# Patient Record
Sex: Male | Born: 1960
Health system: Southern US, Community
[De-identification: ages and names within clinical notes are randomized; demographics above are authoritative.]

## PROBLEM LIST (undated history)

## (undated) DIAGNOSIS — M1990 Unspecified osteoarthritis, unspecified site: Secondary | ICD-10-CM

## (undated) DIAGNOSIS — D649 Anemia, unspecified: Secondary | ICD-10-CM

## (undated) DIAGNOSIS — E119 Type 2 diabetes mellitus without complications: Secondary | ICD-10-CM

## (undated) DIAGNOSIS — G4730 Sleep apnea, unspecified: Secondary | ICD-10-CM

## (undated) DIAGNOSIS — E785 Hyperlipidemia, unspecified: Secondary | ICD-10-CM

## (undated) DIAGNOSIS — E669 Obesity, unspecified: Secondary | ICD-10-CM

## (undated) DIAGNOSIS — I10 Essential (primary) hypertension: Secondary | ICD-10-CM

## (undated) HISTORY — DX: Type 2 diabetes mellitus without complications: E11.9

## (undated) HISTORY — DX: Sleep apnea, unspecified: G47.30

## (undated) HISTORY — PX: FOOT SURGERY: SHX648

## (undated) HISTORY — DX: Obesity, unspecified: E66.9

## (undated) HISTORY — DX: Anemia, unspecified: D64.9

## (undated) HISTORY — DX: Unspecified osteoarthritis, unspecified site: M19.90

## (undated) HISTORY — DX: Hyperlipidemia, unspecified: E78.5

## (undated) HISTORY — DX: Essential (primary) hypertension: I10

---

## 1998-08-10 ENCOUNTER — Inpatient Hospital Stay (HOSPITAL_COMMUNITY): Admission: RE | Admit: 1998-08-10 | Discharge: 1998-08-11 | Payer: Self-pay | Admitting: Orthopedic Surgery

## 1998-08-10 ENCOUNTER — Encounter: Payer: Self-pay | Admitting: Orthopedic Surgery

## 2007-05-13 HISTORY — PX: COLONOSCOPY: SHX174

## 2008-02-17 ENCOUNTER — Ambulatory Visit: Payer: Self-pay | Admitting: Gastroenterology

## 2008-03-02 ENCOUNTER — Ambulatory Visit: Payer: Self-pay | Admitting: Gastroenterology

## 2008-03-08 ENCOUNTER — Telehealth: Payer: Self-pay | Admitting: Gastroenterology

## 2010-10-08 ENCOUNTER — Encounter: Payer: Self-pay | Admitting: Gastroenterology

## 2010-11-21 ENCOUNTER — Ambulatory Visit (INDEPENDENT_AMBULATORY_CARE_PROVIDER_SITE_OTHER): Payer: Managed Care, Other (non HMO) | Admitting: Gastroenterology

## 2010-11-21 ENCOUNTER — Encounter: Payer: Self-pay | Admitting: Gastroenterology

## 2010-11-21 VITALS — BP 106/74 | HR 80 | Ht 68.0 in | Wt 259.0 lb

## 2010-11-21 DIAGNOSIS — D509 Iron deficiency anemia, unspecified: Secondary | ICD-10-CM | POA: Insufficient documentation

## 2010-11-21 DIAGNOSIS — E119 Type 2 diabetes mellitus without complications: Secondary | ICD-10-CM

## 2010-11-21 NOTE — Patient Instructions (Signed)
Upper GI Endoscopy Upper GI endoscopy means using a flexible scope to look at the esophagus, stomach and upper small bowel. This is done to make a diagnosis in people with heartburn, abdominal pain, or abnormal bleeding. Sometimes an endoscope is needed to remove foreign bodies or food that become stuck in the esophagus; it can also be used to take biopsy samples. For the best results, do not eat or drink for 8 hours before having your upper endoscopy.  To perform the endoscopy, you will probably be sedated and your throat will be numbed with a special spray. The endoscope is then slowly passed down your throat (this will not interfere with your breathing). An endoscopy exam takes 15-30 minutes to complete and there is no real pain. Patients rarely remember much about the procedure. The results of the test may take several days if a biopsy or other test is taken.  You may have a sore throat after an endoscopy exam. Serious complications are very rare. Stick to liquids and soft foods until your pain is better. You should not drive a car or operate any dangerous equipment for at least 24 hours after being sedated. SEEK IMMEDIATE MEDICAL CARE IF:  You have severe throat pain.   You have shortness of breath.   You have bleeding problems.   You have a fever.   You have difficulty recovering from your sedation.  Document Released: 06/05/2004 Document Re-Released: 07/23/2009 [facility] Patient Information 2011 [place], [place]. Your EGD is scheduled on 11/25/2010 at 3pm You will go to the basement today to get your lab hemoccult kit

## 2010-11-21 NOTE — Assessment & Plan Note (Signed)
Anemia is probably related to chronic GI blood loss. Active peptic ulcer disease, AVMs or neoplasm are considerations.  Recommendations #1 upper endoscopy #2 serial Hemoccults; if positive and his endoscopy is nondiagnostic I will repeat his colonoscopy

## 2010-11-21 NOTE — Progress Notes (Signed)
History of Present Illness: Donald [name] is a pleasant 50 year old Afro-American male with non-insulin-dependent diabetes mellitus referred at the request of Dr. Perrin Maltese for evaluation of anemia. He was seen at Urgent Care for a brief diarrheal illness. Lab work was pertinent for hemoglobin of 12.7 and MCV is 79.7. Serum iron was 22 and percent saturation 9. He has no GI complaints at this time including change of bowel habits, abdominal pain, melena or hematochezia. He rarely takes an NSAID. There is no history of ulcer disease.  Colonoscopy 2009 was normal.    Review of Systems: Pertinent positive and negative review of systems were noted in the above HPI section. All other review of systems were otherwise negative.    Current Medications, Allergies, Past Medical History, Past Surgical History, Family History and Social History were reviewed in [employer] record . Vital signs were reviewed in today's medical record Physical Exam: General: Well developed , well nourished, no acute distress Head: Normocephalic and atraumatic Eyes:  sclerae anicteric, EOMI Ears: Normal auditory acuity Mouth: No deformity or lesions Neck: Supple, no masses or thyromegaly Lungs: Clear throughout to auscultation Heart: Regular rate and rhythm; no murmurs, rubs or bruits Abdomen: Soft, non tender and non distended. No masses, hepatosplenomegaly or hernias noted. Normal Bowel sounds Rectal:no rectal masses; stool heme negative Musculoskeletal: Symmetrical with no gross deformities  Skin: No lesions on visible extremities Pulses:  Normal pulses noted Extremities: No clubbing, cyanosis, edema or deformities noted Neurological: Alert oriented x 4, grossly nonfocal Cervical Nodes:  No significant cervical adenopathy Inguinal Nodes: No significant inguinal adenopathy Psychological:  Alert and cooperative. Normal mood and affect

## 2010-11-22 ENCOUNTER — Telehealth: Payer: Self-pay | Admitting: Gastroenterology

## 2010-11-25 ENCOUNTER — Other Ambulatory Visit: Payer: Managed Care, Other (non HMO) | Admitting: Gastroenterology

## 2011-02-11 LAB — GLUCOSE, CAPILLARY
Glucose-Capillary: 105 — ABNORMAL HIGH
Glucose-Capillary: 97

## 2011-02-27 NOTE — Telephone Encounter (Signed)
See other phone message  

## 2011-06-18 ENCOUNTER — Other Ambulatory Visit: Payer: Self-pay | Admitting: Physician Assistant

## 2011-07-07 ENCOUNTER — Ambulatory Visit (INDEPENDENT_AMBULATORY_CARE_PROVIDER_SITE_OTHER): Payer: Managed Care, Other (non HMO) | Admitting: Family Medicine

## 2011-07-07 ENCOUNTER — Ambulatory Visit: Payer: Managed Care, Other (non HMO)

## 2011-07-07 VITALS — BP 117/71 | HR 77 | Temp 98.0°F | Resp 20 | Ht 69.0 in | Wt 259.0 lb

## 2011-07-07 DIAGNOSIS — I10 Essential (primary) hypertension: Secondary | ICD-10-CM

## 2011-07-07 DIAGNOSIS — Z0000 Encounter for general adult medical examination without abnormal findings: Secondary | ICD-10-CM

## 2011-07-07 DIAGNOSIS — E119 Type 2 diabetes mellitus without complications: Secondary | ICD-10-CM

## 2011-07-07 LAB — POCT CBC
Granulocyte percent: 46.1 %G (ref 37–80)
HCT, POC: 41.9 % — AB (ref 43.5–53.7)
Hemoglobin: 13.1 g/dL — AB (ref 14.1–18.1)
Lymph, poc: 2 (ref 0.6–3.4)
MCH, POC: 24.9 pg — AB (ref 27–31.2)
MCHC: 31.3 g/dL — AB (ref 31.8–35.4)
MCV: 79.6 fL — AB (ref 80–97)
MID (cbc): 0.4 (ref 0–0.9)
MPV: 7.8 fL (ref 0–99.8)
POC Granulocyte: 2 (ref 2–6.9)
POC LYMPH PERCENT: 45.6 %L (ref 10–50)
POC MID %: 8.3 %M (ref 0–12)
Platelet Count, POC: 227 10*3/uL (ref 142–424)
RBC: 5.27 M/uL (ref 4.69–6.13)
RDW, POC: 14.2 %
WBC: 4.3 10*3/uL — AB (ref 4.6–10.2)

## 2011-07-07 LAB — POCT URINALYSIS DIPSTICK
Bilirubin, UA: NEGATIVE
Glucose, UA: NEGATIVE
Ketones, UA: NEGATIVE
Leukocytes, UA: NEGATIVE
Nitrite, UA: NEGATIVE
Protein, UA: NEGATIVE
Spec Grav, UA: 1.02
Urobilinogen, UA: 0.2
pH, UA: 5.5

## 2011-07-07 LAB — POCT GLYCOSYLATED HEMOGLOBIN (HGB A1C): Hemoglobin A1C: 5.7

## 2011-07-07 LAB — IFOBT (OCCULT BLOOD): IFOBT: NEGATIVE

## 2011-07-07 NOTE — Progress Notes (Signed)
[facility]:  Office Visit  Chief Complaint:  Chief Complaint  Patient presents with  . Annual Exam    HPI: Donald [name] is a 51 y.o. male who complains of annual exam. No new problems. He still works as a Investment banker, operational but is starting to change his diet and working out 3 times per week. Cutting out fast food.   Past Medical History  Diagnosis Date  . Anemia   . DM (diabetes mellitus)   . Sleep apnea     on CPAP  . Obesity   . Hypertension    Past Surgical History  Procedure Date  . Foot surgery   . Colonoscopy 2009   History   Social History  . Marital Status: Married    Spouse Name: N/A    Number of Children: N/A  . Years of Education: N/A   Occupational History  . chef    Social History Main Topics  . Smoking status: Never Smoker   . Smokeless tobacco: None  . Alcohol Use: No  . Drug Use: No  . Sexually Active: None   Other Topics Concern  . None   Social History Narrative  . None   Family History  Problem Relation Age of Onset  . Diabetes Sister   . Colon cancer Neg Hx    No Known Allergies Prior to Admission medications   Medication Sig Start Date End Date Taking? Authorizing Provider  aspirin 81 MG tablet Take 81 mg by mouth daily.     Yes Historical Provider, MD  lisinopril (PRINIVIL,ZESTRIL) 5 MG tablet TAKE 1 TABLET DAILY 06/18/11  Yes Ryan Dunn, PA-C  metFORMIN (GLUCOPHAGE) 500 MG tablet TAKE 1 TABLET TWICE DAILY 06/18/11  Yes Ryan Dunn, PA-C     ROS: The patient denies fevers, chills, night sweats, unintentional weight loss, chest pain, palpitations, wheezing, dyspnea on exertion, nausea, vomiting, abdominal pain, dysuria, hematuria, melena, numbness, weakness, or tingling.  All other systems have been reviewed and were otherwise negative with the exception of those mentioned in the HPI and as above.    PHYSICAL EXAM: Filed Vitals:   07/07/11 0858  BP: 117/71  Pulse: 77  Temp: 98 F (36.7 C)  Resp: 20   Filed Vitals:   07/07/11 0858  Height: 5\' 9"  (1.753 m)  Weight: 259 lb (117.482 kg)   Body mass index is 38.25 kg/(m^2).  General: Alert, no acute distress, obese HEENT:  Normocephalic, atraumatic, oropharynx patent. Cardiovascular:  Regular rate and rhythm, no rubs murmurs or gallops.  No Carotid bruits, radial pulse intact. No pedal edema.  Respiratory: Clear to auscultation bilaterally.  No wheezes, rales, or rhonchi.  No cyanosis, no use of accessory musculature GI: No organomegaly, abdomen is soft and non-tender, positive bowel sounds.  No masses. Skin: No rashes. Neurologic: Facial musculature symmetric. +Sensation intact, vibratory intact.  Psychiatric: Patient is appropriate throughout our interaction. Lymphatic: No cervical lymphadenopathy Musculoskeletal: Gait intact. Genitourinary: uncircumcised, no masses. No hernias, no lesions. Prostate normal.   LABS:    ASSESSMENT/PLAN: Encounter Diagnoses  Name Primary?  . Annual physical exam Yes  . Diabetes mellitus   . HTN (hypertension)    1.Anemia-stable with Hgb at 13.1, hemmosure negative, UTD on colonoscopy 2. HTN-stable, no SEs. 3. T2DM-stable, no neuropathy or hypoglycemia HbA1c at 5.7 4. Labs checked today include: CBC, HbA1c, Hemmosure, CMP, TSH, PSA, Lipids  Since patient is compliant with meds and doing so well with his BP and T2DM, he may return in  6 months for f/u. If at anytime his T2Dm is not well controlled then will bring him back q53months.   Joniya Boberg PHUONG, DO 07/08/2011 2:42 PM

## 2011-07-08 LAB — MICROALBUMIN / CREATININE URINE RATIO
Creatinine, Urine: 98.2 mg/dL
Microalb Creat Ratio: 5.1 mg/g (ref 0.0–30.0)
Microalb, Ur: 0.5 mg/dL (ref 0.00–1.89)

## 2011-07-08 LAB — LIPID PANEL
Cholesterol: 134 mg/dL (ref 0–200)
HDL: 39 mg/dL — ABNORMAL LOW (ref >39)
LDL Cholesterol: 85 mg/dL (ref 0–99)
Total CHOL/HDL Ratio: 3.4 ratio
Triglycerides: 49 mg/dL (ref <150)
VLDL: 10 mg/dL (ref 0–40)

## 2011-07-08 LAB — COMPREHENSIVE METABOLIC PANEL
ALT: 19 U/L (ref 0–53)
AST: 21 U/L (ref 0–37)
Albumin: 4 g/dL (ref 3.5–5.2)
Alkaline Phosphatase: 53 U/L (ref 39–117)
BUN: 14 mg/dL (ref 6–23)
Chloride: 105 mEq/L (ref 96–112)
Potassium: 4.8 mEq/L (ref 3.5–5.3)

## 2011-07-08 LAB — COMPREHENSIVE METABOLIC PANEL WITH GFR
CO2: 25 meq/L (ref 19–32)
Calcium: 9 mg/dL (ref 8.4–10.5)
Creat: 0.61 mg/dL (ref 0.50–1.35)
Glucose, Bld: 85 mg/dL (ref 70–99)
Sodium: 139 meq/L (ref 135–145)
Total Bilirubin: 0.6 mg/dL (ref 0.3–1.2)
Total Protein: 7.4 g/dL (ref 6.0–8.3)

## 2011-07-08 LAB — PSA: PSA: 0.84 ng/mL (ref <=4.00)

## 2011-07-08 LAB — TSH: TSH: 1.731 u[IU]/mL (ref 0.350–4.500)

## 2011-07-28 ENCOUNTER — Encounter: Payer: Self-pay | Admitting: Internal Medicine

## 2011-08-15 ENCOUNTER — Other Ambulatory Visit: Payer: Self-pay | Admitting: Physician Assistant

## 2011-09-01 ENCOUNTER — Other Ambulatory Visit: Payer: Self-pay | Admitting: Physician Assistant

## 2011-12-01 ENCOUNTER — Encounter: Payer: Self-pay | Admitting: Family Medicine

## 2012-01-15 ENCOUNTER — Other Ambulatory Visit: Payer: Self-pay | Admitting: Physician Assistant

## 2012-01-15 ENCOUNTER — Other Ambulatory Visit: Payer: Self-pay | Admitting: Internal Medicine

## 2012-03-15 ENCOUNTER — Encounter: Payer: Managed Care, Other (non HMO) | Admitting: Physician Assistant

## 2012-03-29 ENCOUNTER — Ambulatory Visit (INDEPENDENT_AMBULATORY_CARE_PROVIDER_SITE_OTHER): Payer: Managed Care, Other (non HMO) | Admitting: Physician Assistant

## 2012-03-29 ENCOUNTER — Encounter: Payer: Self-pay | Admitting: Physician Assistant

## 2012-03-29 VITALS — BP 120/74 | HR 80 | Temp 97.9°F | Resp 16 | Ht 68.0 in | Wt 256.6 lb

## 2012-03-29 DIAGNOSIS — Z23 Encounter for immunization: Secondary | ICD-10-CM

## 2012-03-29 DIAGNOSIS — Z0000 Encounter for general adult medical examination without abnormal findings: Secondary | ICD-10-CM

## 2012-03-29 DIAGNOSIS — D509 Iron deficiency anemia, unspecified: Secondary | ICD-10-CM

## 2012-03-29 DIAGNOSIS — E119 Type 2 diabetes mellitus without complications: Secondary | ICD-10-CM

## 2012-03-29 LAB — CBC
HCT: 38.3 % — ABNORMAL LOW (ref 39.0–52.0)
MCV: 76.3 fL — ABNORMAL LOW (ref 78.0–100.0)
RBC: 5.02 MIL/uL (ref 4.22–5.81)
RDW: 14 % (ref 11.5–15.5)
WBC: 5.1 10*3/uL (ref 4.0–10.5)

## 2012-03-29 MED ORDER — METFORMIN HCL 500 MG PO TABS: 500.0000 mg | ORAL_TABLET | Freq: Two times a day (BID) | ORAL | Status: DC

## 2012-03-29 MED ORDER — LISINOPRIL 5 MG PO TABS: 5.0000 mg | ORAL_TABLET | Freq: Every day | ORAL | Status: DC

## 2012-03-29 MED ORDER — GLUCOSE BLOOD VI STRP: ORAL_STRIP | Status: DC

## 2012-03-29 NOTE — Progress Notes (Signed)
Patient ID: Donald [name] MRN: [id number], DOB: 03/01/1961, 51 y.o. Date of Encounter: 03/29/2012, 3:39 PM  Primary Physician: Tally Due, MD  Chief Complaint: Diabetes follow up  HPI: 51 y.o. year old male with history below presents for follow up of diabetes mellitus. Doing well. No issues or complaints. Taking medications daily without adverse effects. No polydipsia, polyphagia, polyuria, or nocturia.  Blood sugars at home: in the low 100's Diet consists of: mostly vegetables. Eats some chicken, [place], and fish. Exercising regularly. Goes to the gym at least 3 days per week and on the days that he does not work at his part time job.  Last A1C: 5.7 on 07/07/11  Eye MD: 05/2011 DDS: 06/2011 Influenza vaccine: requests today Pneumococcal vaccine: 2010 Last CPE: 07/07/2011  Past Medical History  Diagnosis Date  . Anemia   . DM (diabetes mellitus)   . Sleep apnea     on CPAP  . Obesity   . Hypertension      Home Meds: Prior to Admission medications   Medication Sig Start Date End Date Taking? Authorizing Provider  aspirin 81 MG tablet Take 81 mg by mouth daily.     Yes Historical Provider, MD  glucose blood (ONE TOUCH ULTRA TEST) test strip Use as directed 08/15/11  Yes Morrell Riddle, PA-C  lisinopril (PRINIVIL,ZESTRIL) 5 MG tablet Take 1 tablet (5 mg total) by mouth daily. Needs office visit 01/15/12  Yes Nelva Nay, PA-C  metFORMIN (GLUCOPHAGE) 500 MG tablet Take 1 tablet (500 mg total) by mouth 2 (two) times daily. Needs office visit 01/15/12  Yes Nelva Nay, PA-C  lisinopril (PRINIVIL,ZESTRIL) 5 MG tablet TAKE 1 TABLET DAILY 09/01/11   Rickard Patience, PA-C    Allergies: No Known Allergies  History   Social History  . Marital Status: Married    Spouse Name: N/A    Number of Children: N/A  . Years of Education: N/A   Occupational History  . chef    Social History Main Topics  . Smoking status: Never Smoker   . Smokeless tobacco: Not on file  .  Alcohol Use: No  . Drug Use: No  . Sexually Active: Not on file   Other Topics Concern  . Not on file   Social History Narrative  . No narrative on file     Review of Systems: Constitutional: negative for chills, fever, night sweats, weight changes, or fatigue  HEENT: negative for vision changes, hearing loss, congestion, rhinorrhea, or epistaxis Cardiovascular: negative for chest pain, palpitations, diaphoresis, DOE, orthopnea, or edema Respiratory: negative for hemoptysis, wheezing, shortness of breath, dyspnea, or cough Abdominal: negative for abdominal pain, nausea, vomiting, diarrhea, or constipation Dermatological: negative for rash, erythema, or wounds Neurologic: negative for headache, dizziness, or syncope All other systems reviewed and are otherwise negative with the exception to those above and in the HPI.   Physical Exam: Blood pressure 120/74, pulse 80, temperature 97.9 F (36.6 C), temperature source Oral, resp. rate 16, height 5\' 8"  (1.727 m), weight 256 lb 9.6 oz (116.393 kg), SpO2 98.00%., Body mass index is 39.02 kg/(m^2). General: Well developed, well nourished, in no acute distress. Head: Normocephalic, atraumatic, eyes without discharge, sclera non-icteric, nares are without discharge. Bilateral auditory canals clear, TM's are without perforation, pearly grey and translucent with reflective cone of light bilaterally. Oral cavity moist, posterior pharynx without exudate, erythema, peritonsillar abscess, or post nasal drip.  Neck: Supple. No thyromegaly. Full ROM. No lymphadenopathy. No bruits.  Lungs: Clear bilaterally to auscultation without wheezes, rales, or rhonchi. Breathing is unlabored. Heart: RRR with S1 S2. No murmurs, rubs, or gallops appreciated. Msk:  Strength and tone normal for age. Extremities/Skin: Warm and dry. No clubbing or cyanosis. No edema. No rashes, wounds, or suspicious lesions. Monofilament exam unremarkable bilaterally.  Neuro: Alert and  oriented X 3. Moves all extremities spontaneously. Gait is normal. CNII-XII grossly in tact. Psych:  Responds to questions appropriately with a normal affect.   Labs: Results for orders placed in visit on 03/29/12  GLUCOSE, POCT (MANUAL RESULT ENTRY)      Component Value Range   POC Glucose 101 (*) 70 - 99 mg/dl  POCT GLYCOSYLATED HEMOGLOBIN (HGB A1C)      Component Value Range   Hemoglobin A1C 5.9     CMP, lipid, and CBC all pending. Patient is not fasting.   ASSESSMENT AND PLAN:  51 y.o.  year old male with well controlled diabetes mellitus and iron deficiency anemia 1. Diabetes mellitus -Well controlled -Continue current treatment -Metformin 500 mg 1 po bid #60 RF 3 -Glucose test strips #100 as directed RF 3 -Healthy diet, exercise, weight loss -Await labs -Influenza vaccine given today  2. Iron deficiency anemia -Known -Check CBC to verify stability   3. Recheck 3 months   Signed, Eula Listen, PA-C 03/29/2012 3:39 PM

## 2012-03-30 LAB — LIPID PANEL
Cholesterol: 121 mg/dL (ref 0–200)
HDL: 38 mg/dL — ABNORMAL LOW (ref >39)
Total CHOL/HDL Ratio: 3.2 Ratio
Triglycerides: 52 mg/dL (ref <150)

## 2012-03-30 LAB — COMPREHENSIVE METABOLIC PANEL
BUN: 9 mg/dL (ref 6–23)
CO2: 26 mEq/L (ref 19–32)
Calcium: 8.9 mg/dL (ref 8.4–10.5)
Chloride: 103 mEq/L (ref 96–112)
Creat: 0.57 mg/dL (ref 0.50–1.35)
Glucose, Bld: 87 mg/dL (ref 70–99)

## 2012-04-01 ENCOUNTER — Telehealth: Payer: Self-pay

## 2012-04-01 NOTE — Telephone Encounter (Signed)
Pt is calling the lab back from two days ago  Best number [phone]

## 2012-04-02 ENCOUNTER — Telehealth: Payer: Self-pay

## 2012-04-02 NOTE — Telephone Encounter (Signed)
Erroneous encounter

## 2012-04-02 NOTE — Telephone Encounter (Signed)
See labs 

## 2012-08-21 ENCOUNTER — Other Ambulatory Visit: Payer: Self-pay | Admitting: Physician Assistant

## 2012-09-04 ENCOUNTER — Ambulatory Visit (INDEPENDENT_AMBULATORY_CARE_PROVIDER_SITE_OTHER): Payer: BC Managed Care – PPO | Admitting: Family Medicine

## 2012-09-04 VITALS — BP 124/76 | HR 86 | Temp 98.0°F | Resp 18 | Ht 69.0 in | Wt 264.0 lb

## 2012-09-04 DIAGNOSIS — B351 Tinea unguium: Secondary | ICD-10-CM

## 2012-09-04 DIAGNOSIS — D509 Iron deficiency anemia, unspecified: Secondary | ICD-10-CM

## 2012-09-04 DIAGNOSIS — IMO0002 Reserved for concepts with insufficient information to code with codable children: Secondary | ICD-10-CM

## 2012-09-04 DIAGNOSIS — S76311A Strain of muscle, fascia and tendon of the posterior muscle group at thigh level, right thigh, initial encounter: Secondary | ICD-10-CM

## 2012-09-04 DIAGNOSIS — B353 Tinea pedis: Secondary | ICD-10-CM

## 2012-09-04 DIAGNOSIS — L84 Corns and callosities: Secondary | ICD-10-CM

## 2012-09-04 DIAGNOSIS — E119 Type 2 diabetes mellitus without complications: Secondary | ICD-10-CM

## 2012-09-04 LAB — COMPREHENSIVE METABOLIC PANEL
ALT: 21 U/L (ref 0–53)
AST: 21 U/L (ref 0–37)
Albumin: 4 g/dL (ref 3.5–5.2)
Alkaline Phosphatase: 51 U/L (ref 39–117)
Glucose, Bld: 110 mg/dL — ABNORMAL HIGH (ref 70–99)
Potassium: 4.4 mEq/L (ref 3.5–5.3)
Sodium: 137 mEq/L (ref 135–145)
Total Protein: 7.7 g/dL (ref 6.0–8.3)

## 2012-09-04 LAB — POCT CBC
Granulocyte percent: 56.3 %G (ref 37–80)
HCT, POC: 39.4 % — AB (ref 43.5–53.7)
POC Granulocyte: 2.5 (ref 2–6.9)
POC LYMPH PERCENT: 34.2 %L (ref 10–50)
RDW, POC: 13.5 %

## 2012-09-04 LAB — LIPID PANEL
LDL Cholesterol: 92 mg/dL (ref 0–99)
Triglycerides: 37 mg/dL (ref <150)

## 2012-09-04 LAB — GLUCOSE, POCT (MANUAL RESULT ENTRY): POC Glucose: 112 mg/dl — AB (ref 70–99)

## 2012-09-04 LAB — FERRITIN: Ferritin: 225 ng/mL (ref 22–322)

## 2012-09-04 MED ORDER — TRAMADOL HCL 50 MG PO TABS: 50.0000 mg | ORAL_TABLET | Freq: Three times a day (TID) | ORAL | Status: DC | PRN

## 2012-09-04 MED ORDER — NYSTATIN 100000 UNIT/GM EX POWD: Freq: Four times a day (QID) | CUTANEOUS | Status: DC

## 2012-09-04 MED ORDER — TERBINAFINE HCL 250 MG PO TABS: 250.0000 mg | ORAL_TABLET | Freq: Every day | ORAL | Status: DC

## 2012-09-04 NOTE — Patient Instructions (Addendum)
Ice your hamstring/area of pain for 15 minutes at least 3 times a day.  Start aleve twice a day for the pain and then you can use the tramadol for when the pain gets bad at work.  If you are still having pain in another 2 wks, we will want to rfer you to physical therapy.  After you are beginning to feel better, start the stretching exercises.  Hamstring Strain with Rehab The hamstring muscle and tendons are vulnerable to muscle or tendon tear (strain). Hamstring tears cause pain and inflammation in the backside of the thigh, where the hamstring muscles are located. The hamstring is comprised of three muscles that are responsible for straightening the hip, bending the knee, and stabilizing the knee. These muscles are important for walking, running, and jumping. Hamstring strain is the most common injury of the thigh. Hamstring strains are classified as grade 1 or 2 strains. Grade 1 strains cause pain, but the tendon is not lengthened. Grade 2 strains include a lengthened ligament due to the ligament being stretched or partially ruptured. With grade 2 strains there is still function, although the function may be decreased.  SYMPTOMS   Pain, tenderness, swelling, warmth, or redness over the hamstring muscles, at the back of the thigh.  Pain that gets worse during and after intense activity.  A "pop" heard in the area, at the time of injury.  Muscle spasm in the hamstring muscles.  Pain or weakness with running, jumping, or bending the knee against resistance.  Crackling sound (crepitation) when the tendon is moved or touched.  Bruising (contusion) in the thigh within 48 hours of injury.  Loss of fullness of the muscle, or area of muscle bulging in the case of a complete rupture. CAUSES  A muscle strain occurs when a force is placed on the muscle or tendon that is greater than it can withstand. Common causes of injury include:  Strain from overuse or sudden increase in the frequency, duration,  or intensity of activity.  Single violent blow or force to the back of the knee or the hamstring area of the thigh. RISK INCREASES WITH:  Sports that require quick starts (sprinting, racquetball, tennis).  Sports that require jumping (basketball, volleyball).  Kicking sports and water skiing.  Contact sports (soccer, football).  Poor strength and flexibility.  Failure to warm up properly before activity.  Previous thigh, knee, or pelvis injury.  Poor exercise technique.  Poor posture. PREVENTION  Maintain physical fitness:  Strength, flexibility, and endurance.  Cardiovascular fitness.  Learn and use proper exercise technique and posture.  Wear proper fitted and padded protective equipment. PROGNOSIS  If treated properly, hamstring strains are usually curable in 2 to 6 weeks. RELATED COMPLICATIONS   Longer healing time, if not properly treated or if not given adequate time to heal.  Chronically inflamed tendon, causing persistent pain with activity that may progress to constant pain.  Recurring symptoms, if activity is resumed too soon.  Vulnerable to repeated injury (in up to 25% of cases). TREATMENT  Treatment first involves the use of ice and medication to help reduce pain and inflammation. It is also important to complete strengthening and stretching exercises, as well as modifying any activities that aggravate the symptoms. These exercises may be completed at home or with a therapist. Your caregiver may recommend the use of crutches to help reduce pain and discomfort, especially is the strain is severe enough to cause limping. If the tendon has pulled away from the bone,  then surgery is necessary to reattach it. MEDICATION   If pain medicine is needed, nonsteroidal anti-inflammatory medicines (aspirin and ibuprofen), or other minor pain relievers (acetaminophen), are often advised.  Do not take pain medicine for 7 days before surgery.  Prescription pain  relievers may be given if your caregiver thinks they are needed. Use only as directed and only as much as you need.  Corticosteroid injections may be recommended. However, these injections should only be used for serious cases, as they can only be given a certain number of times.  Ointments applied to the skin may be beneficial. HEAT AND COLD  Cold treatment (icing) relieves pain and reduces inflammation. Cold treatment should be applied for 10 to 15 minutes every 2 to 3 hours, and immediately after activity that aggravates your symptoms. Use ice packs or an ice massage.  Heat treatment may be used before performing the stretching and strengthening activities prescribed by your caregiver, physical therapist, or athletic trainer. Use a heat pack or a warm water soak. SEEK MEDICAL CARE IF:   Symptoms get worse or do not improve in 2 weeks, despite treatment.  New, unexplained symptoms develop. (Drugs used in treatment may produce side effects.) EXERCISES RANGE OF MOTION (ROM) AND STRETCHING EXERCISES - Hamstring Strain These exercises may help you when beginning to rehabilitate your injury. Your symptoms may go away with or without further involvement from your physician, physical therapist or athletic trainer. While completing these exercises, remember:   Restoring tissue flexibility helps normal motion to return to the joints. This allows healthier, less painful movement and activity.  An effective stretch should be held for at least 30 seconds.  A stretch should never be painful. You should only feel a gentle lengthening or release in the stretched tissue. STRETCH - Hamstrings, Standing  Stand or sit, and extend your right / left leg, placing your foot on a chair or foot stool.  Keep a slight arch in your low back and your hips straight forward.  Lead with your chest, and lean forward at the waist until you feel a gentle stretch in the back of your right / left knee or thigh. (When  done correctly, this exercise requires leaning only a small distance.)  Hold this position for __________ seconds. Repeat __________ times. Complete this stretch __________ times per day. STRETCH  Hamstrings, Supine   Lie on your back. Loop a belt or towel over the ball of your right / left foot.  Straighten your right / left knee and slowly pull on the belt to raise your leg. Do not allow the right / left knee to bend. Keep your opposite leg flat on the floor.  Raise the leg until you feel a gentle stretch behind your right / left knee or thigh. Hold this position for __________ seconds. Repeat __________ times. Complete this stretch __________ times per day.  STRETCH - Hamstrings, Doorway  Lie on your back with your right / left leg extended and resting on the wall, and the opposite leg flat on the ground through the door. Initially, position your bottom farther away from the wall.  Keep your right / left knee straight. If you feel a stretch behind your knee or thigh, hold this position for __________ seconds.  If you do not feel a stretch, scoot your bottom closer to the door and hold __________ seconds. Repeat __________ times. Complete this stretch __________ times per day.  STRETCH - Hamstrings/Adductors, V-Sit   Sit on the floor with  your legs extended in a large "V," keeping your knees straight.  With your head and chest upright, bend at your waist reaching for your left foot to stretch your right thigh muscles.  You should feel a stretch in your right inner thigh. Hold for __________ seconds.  Return to the upright position to relax your leg muscles.  Continuing to keep your chest upright, bend straight forward at your waist to stretch your hamstrings.  You should feel a stretch behind both of your thighs and knees. Hold for __________ seconds.  Return to the upright position to relax your leg muscles.  With your head and chest upright, bend at your waist reaching for  your right foot to stretch your left thigh muscles.  You should feel a stretch in your left inner thigh. Hold for __________ seconds.  Return to the upright position to relax your leg muscles. Repeat __________ times. Complete this exercise __________ times per day.  STRENGTHENING EXERCISES - Hamstring Strain These exercises may help you when beginning to rehabilitate your injury. They may resolve your symptoms with or without further involvement from your physician, physical therapist or athletic trainer. While completing these exercises, remember:   Muscles can gain both the endurance and the strength needed for everyday activities through controlled exercises.  Complete these exercises as instructed by your physician, physical therapist or athletic trainer. Increase the resistance and repetitions only as guided.  You may experience muscle soreness or fatigue, but the pain or discomfort you are trying to eliminate should never get worse during these exercises. If this pain does get worse, stop and make certain you are following the directions exactly. If the pain is still present after adjustments, discontinue the exercise until you can discuss the trouble with your clinician. STRENGTH - Hip Extensors, Straight Leg Raises   Lie on your stomach on a firm surface.  Tense the muscles in your buttocks to lift your right / left leg about 4 inches. If you cannot lift your leg this high without arching your back, place a pillow under your hips.  Keep your knee straight. Hold for __________ seconds.  Slowly lower your leg to the starting position and allow it to relax completely before starting the next repetition. Repeat __________ times. Complete this exercise __________ times per day.  STRENGTH - Hamstring, Isometrics   Lie on your back on a firm surface.  Bend your right / left knee approximately __________ degrees.  Dig your heel into the surface, as if you are trying to pull it toward  your buttocks. Tighten the muscles in the back of your thighs to "dig" as hard as you can, without increasing any pain.  Hold this position for __________ seconds.  Release the tension gradually and allow your muscles to completely relax for __________ seconds between each exercise. Repeat __________ times. Complete this exercise __________ times per day.  STRENGTH - Hamstring, Curls   Lay on your stomach with your legs extended. (If you lay on a bed, your feet may hang over the edge.)  Tighten the muscles in the back of your thigh to bend your right / left knee up to 90 degrees. Keep your hips flat on the bed or floor.  Hold this position for __________ seconds.  Slowly lower your leg back to the starting position. Repeat __________ times. Complete this exercise __________ times per day.  OPTIONAL ANKLE WEIGHTS: Begin with ____________________, but DO NOT exceed ____________________. Increase in 1 pound/0.5 kilogram increments. Document Released: 04/28/2005  Document Revised: 07/21/2011 Document Reviewed: 08/10/2008 [facility] Patient Information 2013 [place], [place].

## 2012-09-04 NOTE — Progress Notes (Signed)
Subjective:    Patient ID: Donald [name], male    DOB: 1960/09/29, 52 y.o.   MRN: [id number] Chief Complaint  Patient presents with  . Leg Pain    right leg- fell off a ladder 2 weeks ago  . Foot Injury    left foot- 2 weeks    HPI  Donald [name] off a ladder 2 wks ago, he sort of jumped off the ladder and landed standing on his right leg only. Since then Donald [name] has had pain over the lateral upper calf - below knee.  Very painful when standing all day. Has put rubbing alcohol and icy hot. Not tried any nsaids.  Has a corn on his left foot that is also very painful to stand on.  Has had toenail fungus for a long time and would like to take the pills to treat it - states several doctors have offered but always deferred due to concern for liver irritation.  Liver tests have always been normal - no h/o any liver problems.  No alcohol.  DM well controlled - has an appt for a CPE w/ me in 5 wks. Is fasting today.  Past Medical History  Diagnosis Date  . Anemia   . DM (diabetes mellitus)   . Sleep apnea     on CPAP  . Obesity   . Hypertension    Current Outpatient Prescriptions on File Prior to Visit  Medication Sig Dispense Refill  . aspirin 81 MG tablet Take 81 mg by mouth daily.        Marland Kitchen glucose blood (ONE TOUCH ULTRA TEST) test strip Use as directed  100 each  3  . lisinopril (PRINIVIL,ZESTRIL) 5 MG tablet TAKE 1 TABLET EVERY DAY  30 tablet  2  . metFORMIN (GLUCOPHAGE) 500 MG tablet Take 1 tablet (500 mg total) by mouth 2 (two) times daily.  60 tablet  3   No current facility-administered medications on file prior to visit.   No Known Allergies   Review of Systems  Constitutional: Negative for fever, chills, diaphoresis, activity change and appetite change.  Musculoskeletal: Positive for myalgias, joint swelling, arthralgias and gait problem. Negative for back pain.  Skin: Positive for color change, pallor and rash. Negative for wound.  Neurological: Negative for weakness and  numbness.  Hematological: Negative for adenopathy. Does not bruise/bleed easily.  Psychiatric/Behavioral: Negative for sleep disturbance.      BP 124/76  Pulse 86  Temp(Src) 98 F (36.7 C) (Oral)  Resp 18  Ht 5\' 9"  (1.753 m)  Wt 264 lb (119.75 kg)  BMI 38.97 kg/m2  SpO2 99% Objective:   Physical Exam  Constitutional: He is oriented to person, place, and time. He appears well-developed and well-nourished. No distress.  HENT:  Head: Normocephalic and atraumatic.  Eyes: Conjunctivae are normal. Pupils are equal, round, and reactive to light. No scleral icterus.  Neck: Normal range of motion. Neck supple. No thyromegaly present.  Cardiovascular: Normal rate, regular rhythm, normal heart sounds and intact distal pulses.   Pulmonary/Chest: Effort normal and breath sounds normal. No respiratory distress.  Musculoskeletal: Normal range of motion.       Right knee: He exhibits normal range of motion, no swelling, no effusion, no ecchymosis, no laceration, no erythema, normal alignment, no LCL laxity, normal patellar mobility, no bony tenderness, normal meniscus and no MCL laxity. Tenderness found. No medial joint line, no lateral joint line, no MCL, no LCL and no patellar tendon tenderness noted.  Right foot: He exhibits deformity. He exhibits no tenderness, no bony tenderness and normal capillary refill.       Left foot: Normal.  Tenderness over hamstring tendon posteriorly and laterally  Lymphadenopathy:    He has no cervical adenopathy.  Neurological: He is alert and oriented to person, place, and time.  Skin: Skin is warm and dry. He is not diaphoretic.  Bilateral toenails thick, yellow, cracking  Thick firm yellow calluses on lateral balls of feet and pressure points  Fine scaling and white rash over feet  Psychiatric: He has a normal mood and affect. His behavior is normal.      Results for orders placed in visit on 09/04/12  COMPREHENSIVE METABOLIC PANEL      Result Value  Range   Sodium 137  135 - 145 mEq/L   Potassium 4.4  3.5 - 5.3 mEq/L   Chloride 105  96 - 112 mEq/L   CO2 26  19 - 32 mEq/L   Glucose, Bld 110 (*) 70 - 99 mg/dL   BUN 15  6 - 23 mg/dL   Creat 9.60  4.54 - 0.98 mg/dL   Total Bilirubin 0.5  0.3 - 1.2 mg/dL   Alkaline Phosphatase 51  39 - 117 U/L   AST 21  0 - 37 U/L   ALT 21  0 - 53 U/L   Total Protein 7.7  6.0 - 8.3 g/dL   Albumin 4.0  3.5 - 5.2 g/dL   Calcium 9.4  8.4 - 11.9 mg/dL  LIPID PANEL      Result Value Range   Cholesterol 136  0 - 200 mg/dL   Triglycerides 37  <147 mg/dL   HDL 37 (*) >82 mg/dL   Total CHOL/HDL Ratio 3.7     VLDL 7  0 - 40 mg/dL   LDL Cholesterol 92  0 - 99 mg/dL  FERRITIN      Result Value Range   Ferritin 225  22 - 322 ng/mL  GLUCOSE, POCT (MANUAL RESULT ENTRY)      Result Value Range   POC Glucose 112 (*) 70 - 99 mg/dl  POCT GLYCOSYLATED HEMOGLOBIN (HGB A1C)      Result Value Range   Hemoglobin A1C 5.8    POCT CBC      Result Value Range   WBC 4.4 (*) 4.6 - 10.2 K/uL   Lymph, poc 1.5  0.6 - 3.4   POC LYMPH PERCENT 34.2  10 - 50 %L   MID (cbc) 0.4  0 - 0.9   POC MID % 9.5  0 - 12 %M   POC Granulocyte 2.5  2 - 6.9   Granulocyte percent 56.3  37 - 80 %G   RBC 4.86  4.69 - 6.13 M/uL   Hemoglobin 12.3 (*) 14.1 - 18.1 g/dL   HCT, POC 95.6 (*) 21.3 - 53.7 %   MCV 81.0  80 - 97 fL   MCH, POC 25.3 (*) 27 - 31.2 pg   MCHC 31.2 (*) 31.8 - 35.4 g/dL   RDW, POC 08.6     Platelet Count, POC 245  142 - 424 K/uL   MPV 8.1  0 - 99.8 fL    Assessment & Plan:  Diabetes - Plan: POCT glucose (manual entry), POCT glycosylated hemoglobin (Hb A1C), POCT CBC, Comprehensive metabolic panel, Lipid panel, Ambulatory referral to Podiatry - Pt has an appt w/ me in 5 wks for a complete physical. Diabetes is under excellent control on current  metformin.  Iron deficiency anemia, unspecified - Plan: Ferritin  Hamstring strain, right, initial encounter - rec RICE w/ prn tramadol.  Callus of foot - Plan: Ambulatory  referral to Podiatry - as pt has DM, I am reluctant to debride his corn in the office, will refer to podiatry.  Tinea pedis - topical nystatin powder, keep dry  Onychomycosis - start lamisil. Pt has CPE sched w/ me in 5 wks so we can recheck LFTs at that time.  Meds ordered this encounter  Medications  . fish oil-omega-3 fatty acids 1000 MG capsule    Sig: Take 2 g by mouth daily.  . traMADol (ULTRAM) 50 MG tablet    Sig: Take 1 tablet (50 mg total) by mouth every 8 (eight) hours as needed for pain.    Dispense:  30 tablet    Refill:  1  . nystatin (MYCOSTATIN) powder    Sig: Apply topically 4 (four) times daily.    Dispense:  60 g    Refill:  0  . terbinafine (LAMISIL) 250 MG tablet    Sig: Take 1 tablet (250 mg total) by mouth daily.    Dispense:  90 tablet    Refill:  0

## 2012-09-07 ENCOUNTER — Telehealth: Payer: Self-pay

## 2012-09-07 NOTE — Telephone Encounter (Signed)
Pt is returning a missed call Call back number is [phone]

## 2012-09-07 NOTE — Telephone Encounter (Signed)
Was lab results/ patient advised.

## 2012-10-08 ENCOUNTER — Ambulatory Visit: Payer: BC Managed Care – PPO

## 2012-10-08 ENCOUNTER — Encounter: Payer: Self-pay | Admitting: Family Medicine

## 2012-10-08 ENCOUNTER — Ambulatory Visit (INDEPENDENT_AMBULATORY_CARE_PROVIDER_SITE_OTHER): Payer: BC Managed Care – PPO | Admitting: Family Medicine

## 2012-10-08 VITALS — BP 116/72 | HR 74 | Temp 98.0°F | Resp 16 | Ht 68.8 in | Wt 226.6 lb

## 2012-10-08 DIAGNOSIS — D509 Iron deficiency anemia, unspecified: Secondary | ICD-10-CM

## 2012-10-08 DIAGNOSIS — Z125 Encounter for screening for malignant neoplasm of prostate: Secondary | ICD-10-CM

## 2012-10-08 DIAGNOSIS — E119 Type 2 diabetes mellitus without complications: Secondary | ICD-10-CM

## 2012-10-08 DIAGNOSIS — S99922A Unspecified injury of left foot, initial encounter: Secondary | ICD-10-CM

## 2012-10-08 DIAGNOSIS — Z0000 Encounter for general adult medical examination without abnormal findings: Secondary | ICD-10-CM

## 2012-10-08 DIAGNOSIS — S92912A Unspecified fracture of left toe(s), initial encounter for closed fracture: Secondary | ICD-10-CM

## 2012-10-08 DIAGNOSIS — S92919A Unspecified fracture of unspecified toe(s), initial encounter for closed fracture: Secondary | ICD-10-CM

## 2012-10-08 LAB — RETICULOCYTES
RBC.: 5.3 MIL/uL (ref 4.22–5.81)
Retic Ct Pct: 1.1 % (ref 0.4–2.3)

## 2012-10-08 LAB — COMPREHENSIVE METABOLIC PANEL
AST: 19 U/L (ref 0–37)
BUN: 11 mg/dL (ref 6–23)
Calcium: 9 mg/dL (ref 8.4–10.5)
Chloride: 106 mEq/L (ref 96–112)
Creat: 0.62 mg/dL (ref 0.50–1.35)
Total Bilirubin: 0.6 mg/dL (ref 0.3–1.2)

## 2012-10-08 LAB — IRON AND TIBC: UIBC: 215 ug/dL (ref 125–400)

## 2012-10-08 LAB — PSA: PSA: 0.6 ng/mL (ref <=4.00)

## 2012-10-08 MED ORDER — METFORMIN HCL 500 MG PO TABS: 500.0000 mg | ORAL_TABLET | Freq: Every day | ORAL | Status: DC

## 2012-10-08 NOTE — Progress Notes (Addendum)
Subjective:    Patient ID: Donald [name], male    DOB: 02-20-1961, 52 y.o.   MRN: [id number] Chief Complaint  Patient presents with  . Annual Exam  . Medication Refill    HPI cbgs at home 99-110 - checks daily fasting. Taking 1000 mg daily for fish oil - 1 tab a day.  Stubbed 4th toe tripping over the dog last week - thinks it might be broken as still very ttp.   Has had low iron since 2012 causing mild anemia of unknown etiology.  Past Medical History  Diagnosis Date  . Anemia   . DM (diabetes mellitus)   . Sleep apnea     on CPAP  . Obesity   . Hypertension     Current Outpatient Prescriptions on File Prior to Visit  Medication Sig Dispense Refill  . aspirin 81 MG tablet Take 81 mg by mouth daily.        . fish oil-omega-3 fatty acids 1000 MG capsule Take 2 g by mouth daily.      Marland Kitchen glucose blood (ONE TOUCH ULTRA TEST) test strip Use as directed  100 each  3  . lisinopril (PRINIVIL,ZESTRIL) 5 MG tablet TAKE 1 TABLET EVERY DAY  30 tablet  2  . nystatin (MYCOSTATIN) powder Apply topically 4 (four) times daily.  60 g  0  . terbinafine (LAMISIL) 250 MG tablet Take 1 tablet (250 mg total) by mouth daily.  90 tablet  0  . traMADol (ULTRAM) 50 MG tablet Take 1 tablet (50 mg total) by mouth every 8 (eight) hours as needed for pain.  30 tablet  1   No current facility-administered medications on file prior to visit.   Past Surgical History  Procedure Laterality Date  . Foot surgery    . Colonoscopy  2009   Family History  Problem Relation Age of Onset  . Diabetes Sister 42  . Colon cancer Neg Hx    History   Social History  . Marital Status: Married    Spouse Name: N/A    Number of Children: N/A  . Years of Education: N/A   Occupational History  . chef    Social History Main Topics  . Smoking status: Never Smoker   . Smokeless tobacco: None  . Alcohol Use: No  . Drug Use: No  . Sexually Active: None   Other Topics Concern  . None   Social History Narrative    Exercise calisthenics     Review of Systems  Constitutional: Negative.   HENT: Negative.   Eyes: Negative.   Respiratory: Positive for apnea.   Cardiovascular: Negative.   Gastrointestinal: Negative.   Endocrine: Negative.   Genitourinary: Negative.   Musculoskeletal: Positive for arthralgias.       Left foot - hurt toe  Skin: Negative.   Allergic/Immunologic: Negative.   Neurological: Negative.   Hematological: Negative.   Psychiatric/Behavioral: Negative.       BP 116/72  Pulse 74  Temp(Src) 98 F (36.7 C) (Oral)  Resp 16  Ht 5' 8.8" (1.748 m)  Wt 226 lb 9.6 oz (102.785 kg)  BMI 33.64 kg/m2  SpO2 98% Objective:   Physical Exam  Constitutional: He is oriented to person, place, and time. He appears well-developed and well-nourished. No distress.  HENT:  Head: Normocephalic and atraumatic.  Right Ear: Tympanic membrane, external ear and ear canal normal.  Left Ear: Tympanic membrane, external ear and ear canal normal.  Nose: Nose normal.  Mouth/Throat: Uvula  is midline, oropharynx is clear and moist and mucous membranes are normal. No oropharyngeal exudate.  Eyes: Conjunctivae are normal. Right eye exhibits no discharge. Left eye exhibits no discharge. No scleral icterus.  Neck: Normal range of motion. Neck supple. No thyromegaly present.  Cardiovascular: Normal rate, regular rhythm, normal heart sounds and intact distal pulses.   Pulmonary/Chest: Effort normal and breath sounds normal. No respiratory distress.  Abdominal: Soft. Bowel sounds are normal. He exhibits no distension and no mass. There is no tenderness. There is no rebound and no guarding. Hernia confirmed negative in the right inguinal area and confirmed negative in the left inguinal area.  Genitourinary: Rectum normal, prostate normal and testes normal. Rectal exam shows anal tone normal. Guaiac negative stool. Prostate is not enlarged and not tender. Right testis shows no mass, no swelling and no  tenderness. Left testis shows no mass, no swelling and no tenderness.  Musculoskeletal: He exhibits no edema.  Lymphadenopathy:    He has no cervical adenopathy.  Neurological: He is alert and oriented to person, place, and time. He has normal reflexes. No cranial nerve deficit. He exhibits normal muscle tone.  Skin: Skin is warm and dry. No rash noted. He is not diaphoretic. No erythema.  Psychiatric: He has a normal mood and affect. His behavior is normal.     [facility] reading (PRIMARY) by  Dr. Clelia Croft. Left 4th toe xray: Non-displaced fracture at proximal phalynx - healing Assessment & Plan:   Iron deficiency anemia, unspecified - Plan: IFOBT POC (occult bld, rslt in office), Comprehensive metabolic panel, Iron and TIBC, Reticulocytes. Unknown etiology - per pt nml GI w/u as neg/ml colonoscopy in 2009 followed by nml EGD and neg HOC cards.  Foot injury, left, initial encounter - Plan: DG Toe 4th Left  Screening for prostate cancer - Plan: PSA  Toe fracture, left, closed, initial encounter - 4th toe - rec buddy tape and thick firm soled shoes x 4-6 wks.  RTC if pain worsening.  Routine general medical examination at a health care facility - had tdap w/in last 10 yrs, pneumovax given in 2013, nml colonoscopy 2009  Type II or unspecified type diabetes mellitus without mention of complication, not stated as uncontrolled - Plan: Microalbumin, urine.  On 4/26 hgba1c 5.8 - decrease metformin from 500 bid to qd.  Recheck in 4-6 mos - if hgba1c cont to be <6.5 - pt could try going off of metformin completely.  Recheck urine microalb - was nml in past and as cbgs so well controlled and no h/o HTN - will stop lisinopril 2.5mg  qd as long as microalb still nml.  OSA - wearing CAP nightly.  Meds ordered this encounter  Medications  . DISCONTD: metFORMIN (GLUCOPHAGE) 500 MG tablet    Sig: Take 500 mg by mouth 2 (two) times daily. NEED REFILLS  . metFORMIN (GLUCOPHAGE) 500 MG tablet    Sig: Take 1 tablet  (500 mg total) by mouth daily with breakfast. NEED REFILLS    Dispense:  90 tablet    Refill:  1

## 2013-03-11 ENCOUNTER — Ambulatory Visit: Payer: BC Managed Care – PPO | Admitting: Family Medicine

## 2013-04-01 ENCOUNTER — Ambulatory Visit (INDEPENDENT_AMBULATORY_CARE_PROVIDER_SITE_OTHER): Payer: BC Managed Care – PPO | Admitting: Family Medicine

## 2013-04-01 ENCOUNTER — Encounter: Payer: Self-pay | Admitting: Family Medicine

## 2013-04-01 VITALS — BP 118/70 | HR 91 | Temp 98.3°F | Resp 18 | Ht 69.0 in | Wt 270.0 lb

## 2013-04-01 DIAGNOSIS — Z794 Long term (current) use of insulin: Secondary | ICD-10-CM

## 2013-04-01 DIAGNOSIS — E119 Type 2 diabetes mellitus without complications: Secondary | ICD-10-CM

## 2013-04-01 DIAGNOSIS — D649 Anemia, unspecified: Secondary | ICD-10-CM | POA: Insufficient documentation

## 2013-04-01 DIAGNOSIS — Z23 Encounter for immunization: Secondary | ICD-10-CM

## 2013-04-01 LAB — CBC WITH DIFFERENTIAL/PLATELET
Eosinophils Relative: 5 % (ref 0–5)
HCT: 41.5 % (ref 39.0–52.0)
Hemoglobin: 13.8 g/dL (ref 13.0–17.0)
Lymphocytes Relative: 34 % (ref 12–46)
Lymphs Abs: 1.7 10*3/uL (ref 0.7–4.0)
MCV: 77.7 fL — ABNORMAL LOW (ref 78.0–100.0)
Monocytes Absolute: 0.5 10*3/uL (ref 0.1–1.0)
Monocytes Relative: 10 % (ref 3–12)
RBC: 5.34 MIL/uL (ref 4.22–5.81)
WBC: 4.9 10*3/uL (ref 4.0–10.5)

## 2013-04-01 LAB — COMPREHENSIVE METABOLIC PANEL
ALT: 21 U/L (ref 0–53)
BUN: 12 mg/dL (ref 6–23)
CO2: 25 mEq/L (ref 19–32)
Creat: 0.54 mg/dL (ref 0.50–1.35)
Glucose, Bld: 104 mg/dL — ABNORMAL HIGH (ref 70–99)
Total Bilirubin: 0.8 mg/dL (ref 0.3–1.2)

## 2013-04-01 LAB — POCT GLYCOSYLATED HEMOGLOBIN (HGB A1C): Hemoglobin A1C: 5.7

## 2013-04-01 LAB — LIPID PANEL
HDL: 35 mg/dL — ABNORMAL LOW (ref >39)
LDL Cholesterol: 85 mg/dL (ref 0–99)
Total CHOL/HDL Ratio: 3.8 Ratio
Triglycerides: 69 mg/dL (ref <150)

## 2013-04-01 NOTE — Progress Notes (Addendum)
Subjective:    Patient ID: Donald [name], male    DOB: Oct 06, 1960, 52 y.o.   MRN: [id number] Chief Complaint  Patient presents with  . siix month checkup   HPI  Sugars usually running about 110 first thing in the morning.  Diet variable.  Went back to gym. 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  Taking 1 tab fish oil once a day, baby asa, mvi, and vit D.  Would like to get off of all rx meds if he can.   Past Medical History  Diagnosis Date  . Anemia   . DM (diabetes mellitus)   . Sleep apnea     on CPAP  . Obesity   . Hypertension    Current Outpatient Prescriptions on File Prior to Visit  Medication Sig Dispense Refill  . aspirin 81 MG tablet Take 81 mg by mouth daily.        . fish oil-omega-3 fatty acids 1000 MG capsule Take 2 g by mouth daily.      Marland Kitchen glucose blood (ONE TOUCH ULTRA TEST) test strip Use as directed  100 each  3  . metFORMIN (GLUCOPHAGE) 500 MG tablet Take 1 tablet (500 mg total) by mouth daily with breakfast. NEED REFILLS  90 tablet  1  . nystatin (MYCOSTATIN) powder Apply topically 4 (four) times daily.  60 g  0  . terbinafine (LAMISIL) 250 MG tablet Take 1 tablet (250 mg total) by mouth daily.  90 tablet  0  . traMADol (ULTRAM) 50 MG tablet Take 1 tablet (50 mg total) by mouth every 8 (eight) hours as needed for pain.  30 tablet  1   No current facility-administered medications on file prior to visit.   No Known Allergies     Review of Systems  Constitutional: Positive for fatigue. Negative for fever and chills.  Eyes: Negative for visual disturbance.  Respiratory: Negative for shortness of breath.   Cardiovascular: Negative for chest pain and leg swelling.  Endocrine: Negative for polydipsia, polyphagia and polyuria.  Neurological: Negative for dizziness, syncope, facial asymmetry, weakness, light-headedness and headaches.      BP 118/70   Pulse 91  Temp(Src) 98.3 F (36.8 C) (Oral)  Resp 18  Ht 5\' 9"  (1.753 m)  Wt 270 lb (122.471 kg)  BMI 39.85 kg/m2  SpO2 98% Objective:   Physical Exam  Constitutional: He is oriented to person, place, and time. He appears well-developed and well-nourished. No distress.  obese  HENT:  Head: Normocephalic and atraumatic.  Eyes: Conjunctivae are normal. Pupils are equal, round, and reactive to light. No scleral icterus.  Neck: Normal range of motion. Neck supple. No thyromegaly present.  Cardiovascular: Normal rate, regular rhythm, normal heart sounds and intact distal pulses.   Pulmonary/Chest: Effort normal and breath sounds normal. No respiratory distress.  Musculoskeletal: He exhibits no edema.  Lymphadenopathy:    He has no cervical adenopathy.  Neurological: He is alert and oriented to person, place, and time.  Skin: Skin is warm and dry. He is not diaphoretic.  Psychiatric: He has a normal mood and affect. His behavior is normal.      Results for orders placed in visit on 04/01/13  COMPREHENSIVE METABOLIC PANEL      Result Value Range   Sodium 135  135 - 145 mEq/L   Potassium 4.2  3.5 -  5.3 mEq/L   Chloride 102  96 - 112 mEq/L   CO2 25  19 - 32 mEq/L   Glucose, Bld 104 (*) 70 - 99 mg/dL   BUN 12  6 - 23 mg/dL   Creat 1.61  0.96 - 0.45 mg/dL   Total Bilirubin 0.8  0.3 - 1.2 mg/dL   Alkaline Phosphatase 47  39 - 117 U/L   AST 19  0 - 37 U/L   ALT 21  0 - 53 U/L   Total Protein 7.3  6.0 - 8.3 g/dL   Albumin 3.9  3.5 - 5.2 g/dL   Calcium 9.0  8.4 - 40.9 mg/dL  CBC WITH DIFFERENTIAL      Result Value Range   WBC 4.9  4.0 - 10.5 K/uL   RBC 5.34  4.22 - 5.81 MIL/uL   Hemoglobin 13.8  13.0 - 17.0 g/dL   HCT 81.1  91.4 - 78.2 %   MCV 77.7 (*) 78.0 - 100.0 fL   MCH 25.8 (*) 26.0 - 34.0 pg   MCHC 33.3  30.0 - 36.0 g/dL   RDW 95.6  21.3 - 08.6 %   Platelets 230  150 - 400 K/uL   Neutrophils Relative % 51  43 - 77 %   Neutro Abs 2.5  1.7 - 7.7 K/uL   Lymphocytes  Relative 34  12 - 46 %   Lymphs Abs 1.7  0.7 - 4.0 K/uL   Monocytes Relative 10  3 - 12 %   Monocytes Absolute 0.5  0.1 - 1.0 K/uL   Eosinophils Relative 5  0 - 5 %   Eosinophils Absolute 0.2  0.0 - 0.7 K/uL   Basophils Relative 0  0 - 1 %   Basophils Absolute 0.0  0.0 - 0.1 K/uL   Smear Review Criteria for review not met    LIPID PANEL      Result Value Range   Cholesterol 134  0 - 200 mg/dL   Triglycerides 69  <578 mg/dL   HDL 35 (*) >46 mg/dL   Total CHOL/HDL Ratio 3.8     VLDL 14  0 - 40 mg/dL   LDL Cholesterol 85  0 - 99 mg/dL  POCT GLYCOSYLATED HEMOGLOBIN (HGB A1C)      Result Value Range   Hemoglobin A1C 5.7        Assessment & Plan:  Need for prophylactic vaccination and inoculation against influenza - Plan: Flu Vaccine QUAD 36+ mos IM  Type II or unspecified type diabetes mellitus without mention of complication, not stated as uncontrolled - Plan: POCT glycosylated hemoglobin (Hb A1C), Comprehensive metabolic panel, Lipid panel - N6E improving even w/ decreasing metformin so will stop and recheck a1c in 6 mos. Cont diet and exercise. Stopped acei last visit due to nml microalb.  Anemia, unspecified - Plan: CBC with Differential. Unknown etiology - per pt nml GI w/u by Dr. Arlyce Dice as neg colonoscopy in 2009 followed by nml EGD and neg HOC cards. Iron levels were low nml w/ nml ferritin. retic count not increased on last labs.  Follow clinically for now, not on iron supp.  No orders of the defined types were placed in this encounter.    Pt now off of all rx meds. Stopped metformin so will recheck in 6 mos at complete physical and if still doing so great can then switch to annual visits. Norberto Sorenson, MD MPH

## 2013-04-01 NOTE — Patient Instructions (Signed)
Diabetes Meal Planning Guide The diabetes meal planning guide is a tool to help you plan your meals and snacks. It is important for people with diabetes to manage their blood glucose (sugar) levels. Choosing the right foods and the right amounts throughout your day will help control your blood glucose. Eating right can even help you improve your blood pressure and reach or maintain a healthy weight. CARBOHYDRATE COUNTING MADE EASY When you eat carbohydrates, they turn to sugar. This raises your blood glucose level. Counting carbohydrates can help you control this level so you feel better. When you plan your meals by counting carbohydrates, you can have more flexibility in what you eat and balance your medicine with your food intake. Carbohydrate counting simply means adding up the total amount of carbohydrate grams in your meals and snacks. Try to eat about the same amount at each meal. Foods with carbohydrates are listed below. Each portion below is 1 carbohydrate serving or 15 grams of carbohydrates. Ask your dietician how many grams of carbohydrates you should eat at each meal or snack. Grains and Starches  1 slice bread.   English muffin or hotdog/hamburger bun.   cup cold cereal (unsweetened).   cup cooked pasta or rice.   cup starchy vegetables (corn, potatoes, peas, beans, winter squash).  1 tortilla (6 inches).   bagel.  1 waffle or pancake (size of a CD).   cup cooked cereal.  4 to 6 small crackers. *Whole grain is recommended. Fruit  1 cup fresh unsweetened berries, melon, papaya, pineapple.  1 small fresh fruit.   banana or mango.   cup fruit juice (4 oz unsweetened).   cup canned fruit in natural juice or water.  2 tbs dried fruit.  12 to 15 grapes or cherries. Milk and Yogurt  1 cup fat-free or 1% milk.  1 cup soy milk.  6 oz light yogurt with sugar-free sweetener.  6 oz low-fat soy yogurt.  6 oz plain yogurt. Vegetables  1 cup raw or  cup  cooked is counted as 0 carbohydrates or a "free" food.  If you eat 3 or more servings at 1 meal, count them as 1 carbohydrate serving. Other Carbohydrates   oz chips or pretzels.   cup ice cream or frozen yogurt.   cup sherbet or sorbet.  2 inch square cake, no frosting.  1 tbs honey, sugar, jam, jelly, or syrup.  2 small cookies.  3 squares of graham crackers.  3 cups popcorn.  6 crackers.  1 cup broth-based soup.  Count 1 cup casserole or other mixed foods as 2 carbohydrate servings.  Foods with less than 20 calories in a serving may be counted as 0 carbohydrates or a "free" food. You may want to purchase a book or computer software that lists the carbohydrate gram counts of different foods. In addition, the nutrition facts panel on the labels of the foods you eat are a good source of this information. The label will tell you how big the serving size is and the total number of carbohydrate grams you will be eating per serving. Divide this number by 15 to obtain the number of carbohydrate servings in a portion. Remember, 1 carbohydrate serving equals 15 grams of carbohydrate. SERVING SIZES Measuring foods and serving sizes helps you make sure you are getting the right amount of food. The list below tells how big or small some common serving sizes are.  1 oz.........4 stacked dice.  3 oz.........Deck of cards.  1 tsp........Tip   of little finger.  1 tbs........Thumb.  2 tbs........Golf ball.   cup.......Half of a fist.  1 cup........A fist. SAMPLE DIABETES MEAL PLAN Below is a sample meal plan that includes foods from the grain and starches, dairy, vegetable, fruit, and meat groups. A dietician can individualize a meal plan to fit your calorie needs and tell you the number of servings needed from each food group. However, controlling the total amount of carbohydrates in your meal or snack is more important than making sure you include all of the food groups at every  meal. You may interchange carbohydrate containing foods (dairy, starches, and fruits). The meal plan below is an example of a 2000 calorie diet using carbohydrate counting. This meal plan has 17 carbohydrate servings. Breakfast  1 cup oatmeal (2 carb servings).   cup light yogurt (1 carb serving).  1 cup blueberries (1 carb serving).   cup almonds. Snack  1 large apple (2 carb servings).  1 low-fat string cheese stick. Lunch  Chicken breast salad.  1 cup spinach.   cup chopped tomatoes.  2 oz chicken breast, sliced.  2 tbs low-fat Italian dressing.  12 whole-wheat crackers (2 carb servings).  12 to 15 grapes (1 carb serving).  1 cup low-fat milk (1 carb serving). Snack  1 cup carrots.   cup hummus (1 carb serving). Dinner  3 oz broiled salmon.  1 cup brown rice (3 carb servings). Snack  1  cups steamed broccoli (1 carb serving) drizzled with 1 tsp olive oil and lemon juice.  1 cup light pudding (2 carb servings). DIABETES MEAL PLANNING WORKSHEET Your dietician can use this worksheet to help you decide how many servings of foods and what types of foods are right for you.  BREAKFAST Food Group and Servings / Carb Servings Grain/Starches __________________________________ Dairy __________________________________________ Vegetable ______________________________________ Fruit ___________________________________________ Meat __________________________________________ Fat ____________________________________________ LUNCH Food Group and Servings / Carb Servings Grain/Starches ___________________________________ Dairy ___________________________________________ Fruit ____________________________________________ Meat ___________________________________________ Fat _____________________________________________ DINNER Food Group and Servings / Carb Servings Grain/Starches ___________________________________ Dairy  ___________________________________________ Fruit ____________________________________________ Meat ___________________________________________ Fat _____________________________________________ SNACKS Food Group and Servings / Carb Servings Grain/Starches ___________________________________ Dairy ___________________________________________ Vegetable _______________________________________ Fruit ____________________________________________ Meat ___________________________________________ Fat _____________________________________________ DAILY TOTALS Starches _________________________ Vegetable ________________________ Fruit ____________________________ Dairy ____________________________ Meat ____________________________ Fat ______________________________ Document Released: 01/23/2005 Document Revised: 07/21/2011 Document Reviewed: 12/04/2008 [employer] Patient Information 2014 [employer]. Exercise to Lose Weight Exercise and a healthy diet may help you lose weight. Your doctor may suggest specific exercises. EXERCISE IDEAS AND TIPS  Choose low-cost things you enjoy doing, such as walking, bicycling, or exercising to workout videos.  Take stairs instead of the elevator.  Walk during your lunch break.  Park your car further away from work or school.  Go to a gym or an exercise class.  Start with 5 to 10 minutes of exercise each day. Build up to 30 minutes of exercise 4 to 6 days a week.  Wear shoes with good support and comfortable clothes.  Stretch before and after working out.  Work out until you breathe harder and your heart beats faster.  Drink extra water when you exercise.  Do not do so much that you hurt yourself, feel dizzy, or get very short of breath. Exercises that burn about 150 calories:  Running 1  miles in 15 minutes.  Playing volleyball for 45 to 60 minutes.  Washing and waxing a car for 45 to 60 minutes.  Playing touch football for 45  minutes.  Walking 1    miles in 35 minutes.  Pushing a stroller 1  miles in 30 minutes.  Playing basketball for 30 minutes.  Raking leaves for 30 minutes.  Bicycling 5 miles in 30 minutes.  Walking 2 miles in 30 minutes.  Dancing for 30 minutes.  Shoveling snow for 15 minutes.  Swimming laps for 20 minutes.  Walking up stairs for 15 minutes.  Bicycling 4 miles in 15 minutes.  Gardening for 30 to 45 minutes.  Jumping rope for 15 minutes.  Washing windows or floors for 45 to 60 minutes. Document Released: 05/31/2010 Document Revised: 07/21/2011 Document Reviewed: 05/31/2010 Exit[employer] Patient Information 2014 [employer].  

## 2013-04-09 ENCOUNTER — Encounter: Payer: Self-pay | Admitting: Family Medicine

## 2013-06-27 ENCOUNTER — Other Ambulatory Visit: Payer: Self-pay | Admitting: Family Medicine

## 2013-06-28 NOTE — Telephone Encounter (Signed)
faxed

## 2013-08-06 ENCOUNTER — Ambulatory Visit (INDEPENDENT_AMBULATORY_CARE_PROVIDER_SITE_OTHER): Payer: 59 | Admitting: Family Medicine

## 2013-08-06 VITALS — BP 142/90 | HR 85 | Temp 98.0°F | Resp 18 | Ht 69.0 in | Wt 270.0 lb

## 2013-08-06 DIAGNOSIS — M545 Low back pain, unspecified: Secondary | ICD-10-CM

## 2013-08-06 MED ORDER — HYDROCODONE-ACETAMINOPHEN 5-325 MG PO TABS: 1.0000 | ORAL_TABLET | Freq: Four times a day (QID) | ORAL | Status: DC | PRN

## 2013-08-06 MED ORDER — PREDNISONE 20 MG PO TABS: 40.0000 mg | ORAL_TABLET | Freq: Every day | ORAL | Status: DC

## 2013-08-06 NOTE — Progress Notes (Signed)
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 He was worsening around with a 48 year old relative last weekend and suffered acute back pain. He's continued to work all week but the back pain persists.  Patient says the pain has not affected his bowel movements or urination. He has no saddle anesthesia, no radiation of pain down the legs.  Patient has no history of sciatica. The pain is located primarily in the left paralumbar region.  There's been no fever. Patient did have a dental extraction this week.  Objective: Patient in no acute distress and his gait is normal although somewhat wide-based.  Straight-leg raising is negative Reflexes are symmetric and normal in the knee jerk and ankle jerk regions Palpation of the back reveals tightness in the left paraspinal region with no point tenderness.  Assessment: Muscle strain and low back without red flag signs.   Plan:Low back pain - Plan: HYDROcodone-acetaminophen (NORCO) 5-325 MG per tablet, predniSONE (DELTASONE) 20 MG tablet  Signed, Robyn Haber, MD

## 2013-08-06 NOTE — Patient Instructions (Signed)
Back Pain, Adult Low back pain is very common. About 1 in 5 people have back pain.The cause of low back pain is rarely dangerous. The pain often gets better over time.About half of people with a sudden onset of back pain feel better in just 2 weeks. About 8 in 10 people feel better by 6 weeks.  CAUSES Some common causes of back pain include:  Strain of the muscles or ligaments supporting the spine.  Wear and tear (degeneration) of the spinal discs.  Arthritis.  Direct injury to the back. DIAGNOSIS Most of the time, the direct cause of low back pain is not known.However, back pain can be treated effectively even when the exact cause of the pain is unknown.Answering your caregiver's questions about your overall health and symptoms is one of the most accurate ways to make sure the cause of your pain is not dangerous. If your caregiver needs more information, he or she may order lab work or imaging tests (X-rays or MRIs).However, even if imaging tests show changes in your back, this usually does not require surgery. HOME CARE INSTRUCTIONS For many people, back pain returns.Since low back pain is rarely dangerous, it is often a condition that people can learn to manageon their own.   Remain active. It is stressful on the back to sit or stand in one place. Do not sit, drive, or stand in one place for more than 30 minutes at a time. Take short walks on level surfaces as soon as pain allows.Try to increase the length of time you walk each day.  Do not stay in bed.Resting more than 1 or 2 days can delay your recovery.  Do not avoid exercise or work.Your body is made to move.It is not dangerous to be active, even though your back may hurt.Your back will likely heal faster if you return to being active before your pain is gone.  Pay attention to your body when you bend and lift. Many people have less discomfortwhen lifting if they bend their knees, keep the load close to their bodies,and  avoid twisting. Often, the most comfortable positions are those that put less stress on your recovering back.  Find a comfortable position to sleep. Use a firm mattress and lie on your side with your knees slightly bent. If you lie on your back, put a pillow under your knees.  Only take over-the-counter or prescription medicines as directed by your caregiver. Over-the-counter medicines to reduce pain and inflammation are often the most helpful.Your caregiver may prescribe muscle relaxant drugs.These medicines help dull your pain so you can more quickly return to your normal activities and healthy exercise.  Put ice on the injured area.  Put ice in a plastic bag.  Place a towel between your skin and the bag.  Leave the ice on for 15-20 minutes, 03-04 times a day for the first 2 to 3 days. After that, ice and heat may be alternated to reduce pain and spasms.  Ask your caregiver about trying back exercises and gentle massage. This may be of some benefit.  Avoid feeling anxious or stressed.Stress increases muscle tension and can worsen back pain.It is important to recognize when you are anxious or stressed and learn ways to manage it.Exercise is a great option. SEEK MEDICAL CARE IF:  You have pain that is not relieved with rest or medicine.  You have pain that does not improve in 1 week.  You have new symptoms.  You are generally not feeling well. SEEK   IMMEDIATE MEDICAL CARE IF:   You have pain that radiates from your back into your legs.  You develop new bowel or bladder control problems.  You have unusual weakness or numbness in your arms or legs.  You develop nausea or vomiting.  You develop abdominal pain.  You feel faint. Document Released: 04/28/2005 Document Revised: 10/28/2011 Document Reviewed: 09/16/2010 [employer] Patient Information 2014 [employer].  

## 2013-10-21 ENCOUNTER — Encounter: Payer: 59 | Admitting: Family Medicine

## 2013-12-09 ENCOUNTER — Ambulatory Visit (INDEPENDENT_AMBULATORY_CARE_PROVIDER_SITE_OTHER): Payer: 59 | Admitting: Family Medicine

## 2013-12-09 VITALS — BP 140/70 | HR 88 | Temp 98.3°F | Resp 18 | Ht 69.0 in | Wt 281.0 lb

## 2013-12-09 DIAGNOSIS — D509 Iron deficiency anemia, unspecified: Secondary | ICD-10-CM

## 2013-12-09 DIAGNOSIS — Z0000 Encounter for general adult medical examination without abnormal findings: Secondary | ICD-10-CM

## 2013-12-09 DIAGNOSIS — Z125 Encounter for screening for malignant neoplasm of prostate: Secondary | ICD-10-CM

## 2013-12-09 DIAGNOSIS — E119 Type 2 diabetes mellitus without complications: Secondary | ICD-10-CM

## 2013-12-09 LAB — POCT CBC
GRANULOCYTE PERCENT: 53.1 % (ref 37–80)
HCT, POC: 41.8 % — AB (ref 43.5–53.7)
HEMOGLOBIN: 13.3 g/dL — AB (ref 14.1–18.1)
Lymph, poc: 2.1 (ref 0.6–3.4)
MCH: 25.4 pg — AB (ref 27–31.2)
MCHC: 31.8 g/dL (ref 31.8–35.4)
MCV: 80 fL (ref 80–97)
MID (CBC): 0.5 (ref 0–0.9)
MPV: 7.4 fL (ref 0–99.8)
PLATELET COUNT, POC: 211 10*3/uL (ref 142–424)
POC GRANULOCYTE: 2.9 (ref 2–6.9)
POC LYMPH PERCENT: 38.4 %L (ref 10–50)
POC MID %: 8.5 % (ref 0–12)
RBC: 5.22 M/uL (ref 4.69–6.13)
RDW, POC: 14.5 %
WBC: 5.4 10*3/uL (ref 4.6–10.2)

## 2013-12-09 LAB — POCT URINALYSIS DIPSTICK
Bilirubin, UA: NEGATIVE
Glucose, UA: NEGATIVE
Ketones, UA: NEGATIVE
LEUKOCYTES UA: NEGATIVE
NITRITE UA: NEGATIVE
PROTEIN UA: NEGATIVE
Spec Grav, UA: 1.02
Urobilinogen, UA: 0.2
pH, UA: 5.5

## 2013-12-09 LAB — POCT GLYCOSYLATED HEMOGLOBIN (HGB A1C): Hemoglobin A1C: 6

## 2013-12-09 LAB — GLUCOSE, POCT (MANUAL RESULT ENTRY): POC GLUCOSE: 100 mg/dL — AB (ref 70–99)

## 2013-12-09 NOTE — Progress Notes (Signed)
Subjective:    Patient ID: Donald [name], male    DOB: [birth year], 53 y.o.   MRN: [id number] This chart was scribed for Donald Knapp, MD by Cathie Hoops, ED Scribe. The patient was seen in Room 28. The patient's care was started at 2:27 PM.   Chief Complaint  Patient presents with  . Annual Exam   Past Medical History  Diagnosis Date  . Anemia   . DM (diabetes mellitus)   . Sleep apnea     on CPAP  . Obesity   . Hypertension    Current Outpatient Prescriptions on File Prior to Visit  Medication Sig Dispense Refill  . aspirin 81 MG tablet Take 81 mg by mouth daily.        . cholecalciferol (VITAMIN D) 1000 UNITS tablet Take 1,000 Units by mouth daily.      . fish oil-omega-3 fatty acids 1000 MG capsule Take 2 g by mouth daily.      . Multiple Vitamin (MULTIVITAMIN) tablet Take 1 tablet by mouth daily.      Marland Kitchen glucose blood (ONE TOUCH ULTRA TEST) test strip Use as directed  100 each  3  . HYDROcodone-acetaminophen (NORCO) 5-325 MG per tablet Take 1 tablet by mouth every 6 (six) hours as needed for moderate pain.  30 tablet  0  . traMADol (ULTRAM) 50 MG tablet TAKE 1 TABLET (50 MG TOTAL) BY MOUTH EVERY 8 (EIGHT) HOURS AS NEEDED FOR PAIN.  30 tablet  0   No current facility-administered medications on file prior to visit.   Past Surgical History  Procedure Laterality Date  . Foot surgery    . Colonoscopy  2009   Family History  Problem Relation Age of Onset  . Diabetes Sister 68  . Colon cancer Neg Hx    History   Social History  . Marital Status: Married    Spouse Name: N/A    Number of Children: N/A  . Years of Education: N/A   Occupational History  . chef    Social History Main Topics  . Smoking status: Never Smoker   . Smokeless tobacco: Not on file  . Alcohol Use: No  . Drug Use: No  . Sexual Activity: Not on file   Other Topics Concern  . Not on file   Social History Narrative   Exercise calisthenics     HPI HPI Comments: Donald [name] is a 53 y.o.  male who presents to the [facility] for his annual exam. Pt reports he is doing well. Pt reports having a problem on his foot . Pt states his diet and exercise has decreased because he has been working a lot lately. Pt is unable to specify if this will a permanent change. Pt has seen ophthalmologist and dentist in the last year. Pt reports he is sleeping well. Pt denies any bladder and bowl incontinence. Pt reports taking Vitamin B, Vitamin D and a multivitamin.  Immunizations are UTD.  Mr. Hassell Done had been working very hard at diet and exercise in order to get off of his  medication.  At last visit we stopped his metformin 500 q.day. We also had stopped his Lisinopril as his urine microalbumin was normal. Last A1c on metformin q.day was 5.7. He is no longer on any prescription medications and is hoping to remain that day. He does have chronic anemia, iron deficient, who's cause is unknown. He has had a full GI work-up done and it  was normla. He is no longer on an iron supplement. He had colonoscopy in 2009. Pneumovax in 2008. Tdap in 2010. Review of Systems  Constitutional: Negative for fever and chills.  HENT: Negative for congestion and drooling.   Eyes: Negative for pain.  Respiratory: Negative for cough.   Gastrointestinal: Negative for nausea, vomiting, abdominal pain and diarrhea.  Genitourinary: Negative for dysuria, urgency, hematuria, decreased urine volume and difficulty urinating.  Musculoskeletal: Negative for neck pain.  Skin: Negative for color change.  Psychiatric/Behavioral: Negative for behavioral problems.  All other systems reviewed and are negative.    BP 140/70  Pulse 88  Temp(Src) 98.3 F (36.8 C)  Resp 18  Ht 5\' 9"  (1.753 m)  Wt 281 lb (127.461 kg)  BMI 41.48 kg/m2  SpO2 98% Objective:   Physical Exam  Nursing note and vitals reviewed. Constitutional: He is oriented to person, place, and time. He appears well-developed.  HENT:  Head:  Normocephalic.  Right Ear: Tympanic membrane is retracted.  Nose: Nose normal.  Mouth/Throat: Oropharynx is clear and moist.  Left TM with cerumen.  Eyes: Conjunctivae and EOM are normal. No scleral icterus.  Neck: Neck supple. No thyromegaly present.  Cardiovascular: Normal rate, regular rhythm, S1 normal, S2 normal, normal heart sounds and intact distal pulses.   No murmur heard. Pulses:      Dorsalis pedis pulses are 2+ on the right side, and 2+ on the left side.  Pulmonary/Chest: Effort normal and breath sounds normal. No respiratory distress.  Good air movement. Lungs clear.  Abdominal: Soft. Bowel sounds are normal. He exhibits no distension. There is no tenderness. There is no rebound.  Genitourinary: Prostate normal. Rectal exam shows no external hemorrhoid, no internal hemorrhoid and anal tone normal.  Musculoskeletal: Normal range of motion. He exhibits no edema.  Lymphadenopathy:    He has no cervical adenopathy.    He has no axillary adenopathy.  Trace bialterally  Neurological: He is oriented to person, place, and time.  Skin: Skin is warm and dry. No erythema.  Psychiatric: He has a normal mood and affect. His behavior is normal.   Assessment & Plan:  2:35 PM- Pt encouraged to use at home ear wax flushing system. Patient informed of current plan for treatment and evaluation and agrees with plan at this time.  Routine general medical examination at a health care facility  Type II or unspecified type diabetes mellitus without mention of complication, not stated as uncontrolled - Plan: [facility] Diabetes Foot Exam, POCT glycosylated hemoglobin (Hb A1C), POCT glucose (manual entry), POCT urinalysis dipstick, Comprehensive metabolic panel, Lipid panel, TSH, Microalbumin, urine  Severe obesity (BMI >= 40) - Plan: Lipid panel  Iron deficiency anemia, unspecified - Plan: POCT CBC, Ferritin  Screening for prostate cancer - Plan: PSA  No orders of the defined types were placed in  this encounter.    I personally performed the services described in this documentation, which was scribed in my presence. The recorded information has been reviewed and considered, and addended by me as needed.  [name], MD MPH  Results for orders placed in visit on 12/09/13  COMPREHENSIVE METABOLIC PANEL      Result Value Ref Range   Sodium 136  135 - 145 mEq/L   Potassium 4.4  3.5 - 5.3 mEq/L   Chloride 103  96 - 112 mEq/L   CO2 25  19 - 32 mEq/L   Glucose, Bld 96  70 - 99 mg/dL   BUN 11  6 - 23 mg/dL   Creat 0.62  0.50 - 1.35 mg/dL   Total Bilirubin 0.6  0.2 - 1.2 mg/dL   Alkaline Phosphatase 49  39 - 117 U/L   AST 22  0 - 37 U/L   ALT 34  0 - 53 U/L   Total Protein 7.1  6.0 - 8.3 g/dL   Albumin 3.7  3.5 - 5.2 g/dL   Calcium 8.8  8.4 - 10.5 mg/dL  LIPID PANEL      Result Value Ref Range   Cholesterol 142  0 - 200 mg/dL   Triglycerides 103  <150 mg/dL   HDL 34 (*) >39 mg/dL   Total CHOL/HDL Ratio 4.2     VLDL 21  0 - 40 mg/dL   LDL Cholesterol 87  0 - 99 mg/dL  TSH      Result Value Ref Range   TSH 2.672  0.350 - 4.500 uIU/mL  FERRITIN      Result Value Ref Range   Ferritin 255  22 - 322 ng/mL  PSA      Result Value Ref Range   PSA 0.73  <=4.00 ng/mL  MICROALBUMIN, URINE      Result Value Ref Range   Microalb, Ur 0.50  0.00 - 1.89 mg/dL  POCT GLYCOSYLATED HEMOGLOBIN (HGB A1C)      Result Value Ref Range   Hemoglobin A1C 6.0    GLUCOSE, POCT (MANUAL RESULT ENTRY)      Result Value Ref Range   POC Glucose 100 (*) 70 - 99 mg/dl  POCT URINALYSIS DIPSTICK      Result Value Ref Range   Color, UA yellow     Clarity, UA clear     Glucose, UA neg     Bilirubin, UA neg     Ketones, UA neg     Spec Grav, UA 1.020     Blood, UA trace     pH, UA 5.5     Protein, UA neg     Urobilinogen, UA 0.2     Nitrite, UA neg     Leukocytes, UA Negative    POCT CBC      Result Value Ref Range   WBC 5.4  4.6 - 10.2 K/uL   Lymph, poc 2.1  0.6 - 3.4   POC LYMPH PERCENT 38.4   10 - 50 %L   MID (cbc) 0.5  0 - 0.9   POC MID % 8.5  0 - 12 %M   POC Granulocyte 2.9  2 - 6.9   Granulocyte percent 53.1  37 - 80 %G   RBC 5.22  4.69 - 6.13 M/uL   Hemoglobin 13.3 (*) 14.1 - 18.1 g/dL   HCT, POC 41.8 (*) 43.5 - 53.7 %   MCV 80.0  80 - 97 fL   MCH, POC 25.4 (*) 27 - 31.2 pg   MCHC 31.8  31.8 - 35.4 g/dL   RDW, POC 14.5     Platelet Count, POC 211  142 - 424 K/uL   MPV 7.4  0 - 99.8 fL

## 2013-12-09 NOTE — Patient Instructions (Signed)
You have pre-diabetes - if you can start working on your diet and getting to the gym more, you can get rid of it again- with loosing 10 lbs I think you would be fine not coming to see me until your physical last year.  However, if you are not able to start exercising again or gain anymore weight, we will need to restart your metformin so would want you to come back in 6 months to recheck on your a1c and your blood pressure as it is elevated today.  Keeping you healthy  Get these tests  Blood pressure- Have your blood pressure checked once a year by your healthcare provider.  Normal blood pressure is 120/80  Weight- Have your body mass index (BMI) calculated to screen for obesity.  BMI is a measure of body fat based on height and weight. You can also calculate your own BMI at [email or web address].  Cholesterol- Have your cholesterol checked every year.  Diabetes- Have your blood sugar checked regularly if you have high blood pressure, high cholesterol, have a family history of diabetes or if you are overweight.  Screening for Colon Cancer- Colonoscopy starting at age 18.  Screening may begin sooner depending on your family history and other health conditions. Follow up colonoscopy as directed by your Gastroenterologist.  Screening for Prostate Cancer- Both blood work (PSA) and a rectal exam help screen for Prostate Cancer.  Screening begins at age 37 with African-American men and at age 20 with Caucasian men.  Screening may begin sooner depending on your family history.  Take these medicines  Aspirin- One aspirin daily can help prevent Heart disease and Stroke.  Flu shot- Every fall.  Tetanus- Every 10 years.  Zostavax- Once after the age of 12 to prevent Shingles.  Pneumonia shot- Once after the age of 47; if you are younger than 74, ask your healthcare provider if you need a Pneumonia shot.  Take these steps  Don't smoke- If you do smoke, talk to your doctor about quitting.  For  tips on how to quit, go to www.smokefree.gov or call 1-800-QUIT-NOW.  Be physically active- Exercise 5 days a week for at least 30 minutes.  If you are not already physically active start slow and gradually work up to 30 minutes of moderate physical activity.  Examples of moderate activity include walking briskly, mowing the yard, dancing, swimming, bicycling, etc.  Eat a healthy diet- Eat a variety of healthy food such as fruits, vegetables, low fat milk, low fat cheese, yogurt, lean meant, poultry, fish, beans, tofu, etc. For more information go to www.thenutritionsource.org  Drink alcohol in moderation- Limit alcohol intake to less than two drinks a day. Never drink and drive.  Dentist- Brush and floss twice daily; visit your dentist twice a year.  Depression- Your emotional health is as important as your physical health. If you're feeling down, or losing interest in things you would normally enjoy please talk to your healthcare provider.  Eye exam- Visit your eye doctor every year.  Safe sex- If you may be exposed to a sexually transmitted infection, use a condom.  Seat belts- Seat belts can save your life; always wear one.  Smoke/Carbon Monoxide detectors- These detectors need to be installed on the appropriate level of your home.  Replace batteries at least once a year.  Skin cancer- When out in the sun, cover up and use sunscreen 15 SPF or higher.  Violence- If anyone is threatening you, please tell your healthcare provider.  Living  Will/ Health care power of attorney- Speak with your healthcare provider and family.

## 2013-12-10 LAB — COMPREHENSIVE METABOLIC PANEL
ALT: 34 U/L (ref 0–53)
AST: 22 U/L (ref 0–37)
Albumin: 3.7 g/dL (ref 3.5–5.2)
Alkaline Phosphatase: 49 U/L (ref 39–117)
BILIRUBIN TOTAL: 0.6 mg/dL (ref 0.2–1.2)
BUN: 11 mg/dL (ref 6–23)
CALCIUM: 8.8 mg/dL (ref 8.4–10.5)
CHLORIDE: 103 meq/L (ref 96–112)
CO2: 25 meq/L (ref 19–32)
CREATININE: 0.62 mg/dL (ref 0.50–1.35)
Glucose, Bld: 96 mg/dL (ref 70–99)
Potassium: 4.4 mEq/L (ref 3.5–5.3)
Sodium: 136 mEq/L (ref 135–145)
Total Protein: 7.1 g/dL (ref 6.0–8.3)

## 2013-12-10 LAB — LIPID PANEL
CHOLESTEROL: 142 mg/dL (ref 0–200)
HDL: 34 mg/dL — AB (ref >39)
LDL Cholesterol: 87 mg/dL (ref 0–99)
TRIGLYCERIDES: 103 mg/dL (ref <150)
Total CHOL/HDL Ratio: 4.2 Ratio
VLDL: 21 mg/dL (ref 0–40)

## 2013-12-10 LAB — TSH: TSH: 2.672 u[IU]/mL (ref 0.350–4.500)

## 2013-12-10 LAB — FERRITIN: FERRITIN: 255 ng/mL (ref 22–322)

## 2013-12-10 LAB — MICROALBUMIN, URINE: Microalb, Ur: 0.5 mg/dL (ref 0.00–1.89)

## 2013-12-10 LAB — PSA: PSA: 0.73 ng/mL

## 2013-12-30 ENCOUNTER — Telehealth: Payer: Self-pay | Admitting: *Deleted

## 2013-12-30 NOTE — Telephone Encounter (Signed)
Called patient left message in voice mail to continue exercise and monitor glucose levels. Return in 6 months as advised by Dr Brigitte Pulse for follow up.

## 2014-02-04 ENCOUNTER — Telehealth: Payer: Self-pay

## 2014-02-04 NOTE — Telephone Encounter (Signed)
Clld pt - LMOVM of cell to call office to schedule Diabetic Care follow up appt.

## 2014-03-07 ENCOUNTER — Encounter: Payer: Self-pay | Admitting: Family Medicine

## 2014-04-20 ENCOUNTER — Ambulatory Visit (INDEPENDENT_AMBULATORY_CARE_PROVIDER_SITE_OTHER): Payer: 59 | Admitting: Family Medicine

## 2014-04-20 VITALS — BP 126/82 | HR 86 | Temp 97.8°F | Resp 16 | Ht 70.0 in | Wt 281.6 lb

## 2014-04-20 DIAGNOSIS — Z23 Encounter for immunization: Secondary | ICD-10-CM

## 2014-04-20 DIAGNOSIS — R7309 Other abnormal glucose: Secondary | ICD-10-CM

## 2014-04-20 DIAGNOSIS — D509 Iron deficiency anemia, unspecified: Secondary | ICD-10-CM

## 2014-04-20 DIAGNOSIS — R7303 Prediabetes: Secondary | ICD-10-CM

## 2014-04-20 LAB — POCT CBC
Granulocyte percent: 62.3 %G (ref 37–80)
HCT, POC: 44.9 % (ref 43.5–53.7)
HEMOGLOBIN: 14.2 g/dL (ref 14.1–18.1)
LYMPH, POC: 2.9 (ref 0.6–3.4)
MCH: 24.9 pg — AB (ref 27–31.2)
MCHC: 31.6 g/dL — AB (ref 31.8–35.4)
MCV: 78.8 fL — AB (ref 80–97)
MID (cbc): 0.3 (ref 0–0.9)
MPV: 6.9 fL (ref 0–99.8)
POC Granulocyte: 5.2 (ref 2–6.9)
POC LYMPH PERCENT: 34 %L (ref 10–50)
POC MID %: 3.7 % (ref 0–12)
Platelet Count, POC: 246 10*3/uL (ref 142–424)
RBC: 5.7 M/uL (ref 4.69–6.13)
RDW, POC: 14.4 %
WBC: 8.4 10*3/uL (ref 4.6–10.2)

## 2014-04-20 LAB — GLUCOSE, POCT (MANUAL RESULT ENTRY): POC Glucose: 116 mg/dl — AB (ref 70–99)

## 2014-04-20 LAB — POCT GLYCOSYLATED HEMOGLOBIN (HGB A1C): Hemoglobin A1C: 5.6

## 2014-04-20 NOTE — Progress Notes (Signed)
Subjective:  This chart was scribed for Donald Cheadle, MD by Ladene Artist, ED Scribe. The patient was seen in room 2. Patient's care was started at 11:34 AM.    Patient ID: Donald [name], male    DOB: 17-Mar-1961, 53 y.o.   MRN: [id number]  Chief Complaint  Patient presents with  . Diabetes  . Flu Vaccine   HPI HPI Comments: Donald [name] is a 53 y.o. male, with a h/o DM, HTN, anemia, who presents to the [facility] for a follow-up regarding DM. Pt last seen for CPE 4 months ago. Hgb A1C was 6.0. He was able to be winged off of Metformin due to improvements through diet and exercise.   DM Pt states that he checked his blood sugar at home last week with a reading of 118. Pt has an appointment with his foot doctor later today. He does not express any concerns at this time.   Immunizations Pt requests a flu vaccine at this visit.   Past Medical History  Diagnosis Date  . Anemia   . DM (diabetes mellitus)   . Sleep apnea     on CPAP  . Obesity   . Hypertension    Current Outpatient Prescriptions on File Prior to Visit  Medication Sig Dispense Refill  . aspirin 81 MG tablet Take 81 mg by mouth daily.      . cholecalciferol (VITAMIN D) 1000 UNITS tablet Take 1,000 Units by mouth daily.    . Multiple Vitamin (MULTIVITAMIN) tablet Take 1 tablet by mouth daily.     No current facility-administered medications on file prior to visit.   No Known Allergies  Review of Systems  Constitutional: Negative for fever, chills and fatigue.  Neurological: Negative for weakness and numbness.   BP 126/82 mmHg  Pulse 86  Temp(Src) 97.8 F (36.6 C) (Oral)  Resp 16  Ht 5\' 10"  (1.778 m)  Wt 281 lb 9.6 oz (127.733 kg)  BMI 40.41 kg/m2  SpO2 97%     Objective:   Physical Exam  Constitutional: He is oriented to person, place, and time. He appears well-developed and well-nourished. No distress.  HENT:  Head: Normocephalic and atraumatic.  Eyes: Conjunctivae and EOM are  normal.  Neck: Neck supple. No tracheal deviation present.  Cardiovascular: Normal rate, regular rhythm and normal heart sounds.   Pulses:      Dorsalis pedis pulses are 2+ on the right side, and 2+ on the left side.       Posterior tibial pulses are 2+ on the right side, and 2+ on the left side.  Pulmonary/Chest: Effort normal and breath sounds normal. No respiratory distress.  Musculoskeletal: Normal range of motion. He exhibits no edema.  Neurological: He is alert and oriented to person, place, and time.  Skin: Skin is warm and dry.  Toenails thickened and yellow with dark brown spots from medications.  Psychiatric: He has a normal mood and affect. His behavior is normal.  Nursing note and vitals reviewed.    Assessment & Plan:  Flu vaccine need - Plan: Flu Vaccine QUAD 36+ mos IM  Prediabetes - Plan: POCT glycosylated hemoglobin (Hb A1C), POCT glucose (manual entry)  Anemia, iron deficiency - Plan: POCT CBC    I personally performed the services described in this documentation, which was scribed in my presence. The recorded information has been reviewed and considered, and addended by me as needed.  Donald Cheadle, MD MPH   Results for orders  placed or performed in visit on 04/20/14  POCT glycosylated hemoglobin (Hb A1C)  Result Value Ref Range   Hemoglobin A1C 5.6   POCT CBC  Result Value Ref Range   WBC 8.4 4.6 - 10.2 K/uL   Lymph, poc 2.9 0.6 - 3.4   POC LYMPH PERCENT 34.0 10 - 50 %L   MID (cbc) 0.3 0 - 0.9   POC MID % 3.7 0 - 12 %M   POC Granulocyte 5.2 2 - 6.9   Granulocyte percent 62.3 37 - 80 %G   RBC 5.70 4.69 - 6.13 M/uL   Hemoglobin 14.2 14.1 - 18.1 g/dL   HCT, POC 44.9 43.5 - 53.7 %   MCV 78.8 (A) 80 - 97 fL   MCH, POC 24.9 (A) 27 - 31.2 pg   MCHC 31.6 (A) 31.8 - 35.4 g/dL   RDW, POC 14.4 %   Platelet Count, POC 246 142 - 424 K/uL   MPV 6.9 0 - 99.8 fL  POCT glucose (manual entry)  Result Value Ref Range   POC Glucose 116 (A) 70 - 99 mg/dl

## 2014-08-02 LAB — HM DIABETES EYE EXAM

## 2014-09-25 ENCOUNTER — Encounter: Payer: Self-pay | Admitting: *Deleted

## 2014-10-19 ENCOUNTER — Encounter: Payer: Self-pay | Admitting: Family Medicine

## 2014-11-24 ENCOUNTER — Encounter: Payer: Self-pay | Admitting: Gastroenterology

## 2015-05-12 ENCOUNTER — Ambulatory Visit (INDEPENDENT_AMBULATORY_CARE_PROVIDER_SITE_OTHER): Payer: 59 | Admitting: Emergency Medicine

## 2015-05-12 VITALS — BP 140/80 | HR 80 | Temp 98.1°F | Resp 18 | Ht 70.0 in | Wt 292.6 lb

## 2015-05-12 DIAGNOSIS — Z13220 Encounter for screening for lipoid disorders: Secondary | ICD-10-CM

## 2015-05-12 DIAGNOSIS — Z125 Encounter for screening for malignant neoplasm of prostate: Secondary | ICD-10-CM | POA: Diagnosis not present

## 2015-05-12 DIAGNOSIS — Z9989 Dependence on other enabling machines and devices: Secondary | ICD-10-CM

## 2015-05-12 DIAGNOSIS — Z1159 Encounter for screening for other viral diseases: Secondary | ICD-10-CM

## 2015-05-12 DIAGNOSIS — G4733 Obstructive sleep apnea (adult) (pediatric): Secondary | ICD-10-CM

## 2015-05-12 DIAGNOSIS — Z0000 Encounter for general adult medical examination without abnormal findings: Secondary | ICD-10-CM | POA: Diagnosis not present

## 2015-05-12 DIAGNOSIS — Z23 Encounter for immunization: Secondary | ICD-10-CM | POA: Diagnosis not present

## 2015-05-12 DIAGNOSIS — R7303 Prediabetes: Secondary | ICD-10-CM | POA: Diagnosis not present

## 2015-05-12 LAB — COMPLETE METABOLIC PANEL WITH GFR
ALBUMIN: 3.7 g/dL (ref 3.6–5.1)
ALK PHOS: 51 U/L (ref 40–115)
ALT: 23 U/L (ref 9–46)
AST: 18 U/L (ref 10–35)
BUN: 11 mg/dL (ref 7–25)
CALCIUM: 8.7 mg/dL (ref 8.6–10.3)
CO2: 26 mmol/L (ref 20–31)
Chloride: 104 mmol/L (ref 98–110)
Creat: 0.63 mg/dL — ABNORMAL LOW (ref 0.70–1.33)
Glucose, Bld: 106 mg/dL — ABNORMAL HIGH (ref 65–99)
POTASSIUM: 4.4 mmol/L (ref 3.5–5.3)
Sodium: 136 mmol/L (ref 135–146)
Total Bilirubin: 0.7 mg/dL (ref 0.2–1.2)
Total Protein: 7.6 g/dL (ref 6.1–8.1)

## 2015-05-12 LAB — LIPID PANEL
CHOL/HDL RATIO: 4.5 ratio (ref <=5.0)
CHOLESTEROL: 147 mg/dL (ref 125–200)
HDL: 33 mg/dL — AB (ref >=40)
LDL Cholesterol: 96 mg/dL (ref <130)
TRIGLYCERIDES: 90 mg/dL (ref <150)
VLDL: 18 mg/dL (ref <30)

## 2015-05-12 LAB — POCT GLYCOSYLATED HEMOGLOBIN (HGB A1C): HEMOGLOBIN A1C: 6.4

## 2015-05-12 LAB — POCT CBC
Granulocyte percent: 56 %G (ref 37–80)
HCT, POC: 39.6 % — AB (ref 43.5–53.7)
Hemoglobin: 13.4 g/dL — AB (ref 14.1–18.1)
LYMPH, POC: 2.3 (ref 0.6–3.4)
MCH, POC: 26 pg — AB (ref 27–31.2)
MCHC: 33.8 g/dL (ref 31.8–35.4)
MCV: 76.9 fL — AB (ref 80–97)
MID (CBC): 0.3 (ref 0–0.9)
MPV: 6.4 fL (ref 0–99.8)
PLATELET COUNT, POC: 225 10*3/uL (ref 142–424)
POC Granulocyte: 3.3 (ref 2–6.9)
POC LYMPH %: 38.5 % (ref 10–50)
POC MID %: 5.5 % (ref 0–12)
RBC: 5.15 M/uL (ref 4.69–6.13)
RDW, POC: 14.1 %
WBC: 5.9 10*3/uL (ref 4.6–10.2)

## 2015-05-12 LAB — POCT URINALYSIS DIP (MANUAL ENTRY)
BILIRUBIN UA: NEGATIVE
Glucose, UA: NEGATIVE
Ketones, POC UA: NEGATIVE
LEUKOCYTES UA: NEGATIVE
NITRITE UA: NEGATIVE
Protein Ur, POC: NEGATIVE
Spec Grav, UA: 1.02
UROBILINOGEN UA: 0.2
pH, UA: 6.5

## 2015-05-12 LAB — TSH: TSH: 2.1 u[IU]/mL (ref 0.350–4.500)

## 2015-05-12 LAB — GLUCOSE, POCT (MANUAL RESULT ENTRY): POC Glucose: 121 mg/dl — AB (ref 70–99)

## 2015-05-12 MED ORDER — METFORMIN HCL ER 500 MG PO TB24: ORAL_TABLET | ORAL | Status: DC

## 2015-05-12 NOTE — Patient Instructions (Addendum)
Health Maintenance, Male A healthy lifestyle and preventative care can promote health and wellness.  Maintain regular health, dental, and eye exams.  Eat a healthy diet. Foods like vegetables, fruits, whole grains, low-fat dairy products, and lean protein foods contain the nutrients you need and are low in calories. Decrease your intake of foods high in solid fats, added sugars, and salt. Get information about a proper diet from your health care provider, if necessary.  Regular physical exercise is one of the most important things you can do for your health. Most adults should get at least 150 minutes of moderate-intensity exercise (any activity that increases your heart rate and causes you to sweat) each week. In addition, most adults need muscle-strengthening exercises on 2 or more days a week.   Maintain a healthy weight. The body mass index (BMI) is a screening tool to identify possible weight problems. It provides an estimate of body fat based on height and weight. Your health care provider can find your BMI and can help you achieve or maintain a healthy weight. For males 20 years and older:  A BMI below 18.5 is considered underweight.  A BMI of 18.5 to 24.9 is normal.  A BMI of 25 to 29.9 is considered overweight.  A BMI of 30 and above is considered obese.  Maintain normal blood lipids and cholesterol by exercising and minimizing your intake of saturated fat. Eat a balanced diet with plenty of fruits and vegetables. Blood tests for lipids and cholesterol should begin at age 20 and be repeated every 5 years. If your lipid or cholesterol levels are high, you are over age 50, or you are at high risk for heart disease, you may need your cholesterol levels checked more frequently.Ongoing high lipid and cholesterol levels should be treated with medicines if diet and exercise are not working.  If you smoke, find out from your health care provider how to quit. If you do not use tobacco, do not  start.  Lung cancer screening is recommended for adults aged 55-80 years who are at high risk for developing lung cancer because of a history of smoking. A yearly low-dose CT scan of the lungs is recommended for people who have at least a 30-pack-year history of smoking and are current smokers or have quit within the past 15 years. A pack year of smoking is smoking an average of 1 pack of cigarettes a day for 1 year (for example, a 30-pack-year history of smoking could mean smoking 1 pack a day for 30 years or 2 packs a day for 15 years). Yearly screening should continue until the smoker has stopped smoking for at least 15 years. Yearly screening should be stopped for people who develop a health problem that would prevent them from having lung cancer treatment.  If you choose to drink alcohol, do not have more than 2 drinks per day. One drink is considered to be 12 oz (360 mL) of beer, 5 oz (150 mL) of wine, or 1.5 oz (45 mL) of liquor.  Avoid the use of street drugs. Do not share needles with anyone. Ask for help if you need support or instructions about stopping the use of drugs.  High blood pressure causes heart disease and increases the risk of stroke. High blood pressure is more likely to develop in:  People who have blood pressure in the end of the normal range (100-139/85-89 mm Hg).  People who are overweight or obese.  People who are African American.    If you are 18-39 years of age, have your blood pressure checked every 3-5 years. If you are 40 years of age or older, have your blood pressure checked every year. You should have your blood pressure measured twice--once when you are at a hospital or clinic, and once when you are not at a hospital or clinic. Record the average of the two measurements. To check your blood pressure when you are not at a hospital or clinic, you can use:  An automated blood pressure machine at a pharmacy.  A home blood pressure monitor.  If you are 45-79 years  old, ask your health care provider if you should take aspirin to prevent heart disease.  Diabetes screening involves taking a blood sample to check your fasting blood sugar level. This should be done once every 3 years after age 45 if you are at a normal weight and without risk factors for diabetes. Testing should be considered at a younger age or be carried out more frequently if you are overweight and have at least 1 risk factor for diabetes.  Colorectal cancer can be detected and often prevented. Most routine colorectal cancer screening begins at the age of 50 and continues through age 75. However, your health care provider may recommend screening at an earlier age if you have risk factors for colon cancer. On a yearly basis, your health care provider may provide home test kits to check for hidden blood in the stool. A small camera at the end of a tube may be used to directly examine the colon (sigmoidoscopy or colonoscopy) to detect the earliest forms of colorectal cancer. Talk to your health care provider about this at age 50 when routine screening begins. A direct exam of the colon should be repeated every 5-10 years through age 75, unless early forms of precancerous polyps or small growths are found.  People who are at an increased risk for hepatitis B should be screened for this virus. You are considered at high risk for hepatitis B if:  You were born in a country where hepatitis B occurs often. Talk with your health care provider about which countries are considered high risk.  Your parents were born in a high-risk country and you have not received a shot to protect against hepatitis B (hepatitis B vaccine).  You have HIV or AIDS.  You use needles to inject street drugs.  You live with, or have sex with, someone who has hepatitis B.  You are a man who has sex with other men (MSM).  You get hemodialysis treatment.  You take certain medicines for conditions like cancer, organ  transplantation, and autoimmune conditions.  Hepatitis C blood testing is recommended for all people born from 1945 through 1965 and any individual with known risk factors for hepatitis C.  Healthy men should no longer receive prostate-specific antigen (PSA) blood tests as part of routine cancer screening. Talk to your health care provider about prostate cancer screening.  Testicular cancer screening is not recommended for adolescents or adult males who have no symptoms. Screening includes self-exam, a health care provider exam, and other screening tests. Consult with your health care provider about any symptoms you have or any concerns you have about testicular cancer.  Practice safe sex. Use condoms and avoid high-risk sexual practices to reduce the spread of sexually transmitted infections (STIs).  You should be screened for STIs, including gonorrhea and chlamydia if:  You are sexually active and are younger than 24 years.  You   are older than 24 years, and your health care provider tells you that you are at risk for this type of infection.  Your sexual activity has changed since you were last screened, and you are at an increased risk for chlamydia or gonorrhea. Ask your health care provider if you are at risk.  If you are at risk of being infected with HIV, it is recommended that you take a prescription medicine daily to prevent HIV infection. This is called pre-exposure prophylaxis (PrEP). You are considered at risk if:  You are a man who has sex with other men (MSM).  You are a heterosexual man who is sexually active with multiple partners.  You take drugs by injection.  You are sexually active with a partner who has HIV.  Talk with your health care provider about whether you are at high risk of being infected with HIV. If you choose to begin PrEP, you should first be tested for HIV. You should then be tested every 3 months for as long as you are taking PrEP.  Use sunscreen. Apply  sunscreen liberally and repeatedly throughout the day. You should seek shade when your shadow is shorter than you. Protect yourself by wearing long sleeves, pants, a wide-brimmed hat, and sunglasses year round whenever you are outdoors.  Tell your health care provider of new moles or changes in moles, especially if there is a change in shape or color. Also, tell your health care provider if a mole is larger than the size of a pencil eraser.  A one-time screening for abdominal aortic aneurysm (AAA) and surgical repair of large AAAs by ultrasound is recommended for men aged 86-75 years who are current or former smokers.  Stay current with your vaccines (immunizations).   This information is not intended to replace advice given to you by your health care provider. Make sure you discuss any questions you have with your health care provider.   Document Released: 10/25/2007 Document Revised: 05/19/2014 Document Reviewed: 09/23/2010 Elsevier Interactive Patient Education 2016 Elsevier [employer] Type 2 Diabetes Mellitus, Adult Type 2 diabetes mellitus, often simply referred to as type 2 diabetes, is a long-lasting (chronic) disease. In type 2 diabetes, the pancreas does not make enough insulin (a hormone), the cells are less responsive to the insulin that is made (insulin resistance), or both. Normally, insulin moves sugars from food into the tissue cells. The tissue cells use the sugars for energy. The lack of insulin or the lack of normal response to insulin causes excess sugars to build up in the blood instead of going into the tissue cells. As a result, high blood sugar (hyperglycemia) develops. The effect of high sugar (glucose) levels can cause many complications. Type 2 diabetes was also previously called adult-onset diabetes, but it can occur at any age.  RISK FACTORS  A person is predisposed to developing type 2 diabetes if someone in the family has the disease and also has one or more of the  following primary risk factors:  Weight gain, or being overweight or obese.  An inactive lifestyle.  A history of consistently eating high-calorie foods. Maintaining a normal weight and regular physical activity can reduce the chance of developing type 2 diabetes. SYMPTOMS  A person with type 2 diabetes may not show symptoms initially. The symptoms of type 2 diabetes appear slowly. The symptoms include:  Increased thirst (polydipsia).  Increased urination (polyuria).  Increased urination during the night (nocturia).  Sudden or unexplained weight changes.  Frequent, recurring infections.  Tiredness (fatigue).  Weakness.  Vision changes, such as blurred vision.  Fruity smell to your breath.  Abdominal pain.  Nausea or vomiting.  Cuts or bruises which are slow to heal.  Tingling or numbness in the hands or feet.  An open skin wound (ulcer). DIAGNOSIS Type 2 diabetes is frequently not diagnosed until complications of diabetes are present. Type 2 diabetes is diagnosed when symptoms or complications are present and when blood glucose levels are increased. Your blood glucose level may be checked by one or more of the following blood tests:  A fasting blood glucose test. You will not be allowed to eat for at least 8 hours before a blood sample is taken.  A random blood glucose test. Your blood glucose is checked at any time of the day regardless of when you ate.  A hemoglobin A1c blood glucose test. A hemoglobin A1c test provides information about blood glucose control over the previous 3 months.  An oral glucose tolerance test (OGTT). Your blood glucose is measured after you have not eaten (fasted) for 2 hours and then after you drink a glucose-containing beverage. TREATMENT   You may need to take insulin or diabetes medicine daily to keep blood glucose levels in the desired range.  If you use insulin, you may need to adjust the dosage depending on the carbohydrates that  you eat with each meal or snack.  Lifestyle changes are recommended as part of your treatment. These may include:  Following an individualized diet plan developed by a nutritionist or dietitian.  Exercising daily. Your health care providers will set individualized treatment goals for you based on your age, your medicines, how long you have had diabetes, and any other medical conditions you have. Generally, the goal of treatment is to maintain the following blood glucose levels:  Before meals (preprandial): 80-130 mg/dL.  After meals (postprandial): below 180 mg/dL.  A1c: less than 6.5-7%. HOME CARE INSTRUCTIONS   Have your hemoglobin A1c level checked twice a year.  Perform daily blood glucose monitoring as directed by your health care provider.  Monitor urine ketones when you are ill and as directed by your health care provider.  Take your diabetes medicine or insulin as directed by your health care provider to maintain your blood glucose levels in the desired range.  Never run out of diabetes medicine or insulin. It is needed every day.  If you are using insulin, you may need to adjust the amount of insulin given based on your intake of carbohydrates. Carbohydrates can raise blood glucose levels but need to be included in your diet. Carbohydrates provide vitamins, minerals, and fiber which are an essential part of a healthy diet. Carbohydrates are found in fruits, vegetables, whole grains, dairy products, legumes, and foods containing added sugars.  Eat healthy foods. You should make an appointment to see a registered dietitian to help you create an eating plan that is right for you.  Lose weight if you are overweight.  Carry a medical alert card or wear your medical alert jewelry.  Carry a 15-gram carbohydrate snack with you at all times to treat low blood glucose (hypoglycemia). Some examples of 15-gram carbohydrate snacks include:  Glucose tablets, 3 or 4.  Glucose gel,  15-gram tube.  Raisins, 2 tablespoons (24 grams).  Jelly beans, 6.  Animal crackers, 8.  Regular pop, 4 ounces (120 mL).  Gummy treats, 9.  Recognize hypoglycemia. Hypoglycemia occurs with blood glucose levels of 70 mg/dL and below. The risk for  hypoglycemia increases when fasting or skipping meals, during or after intense exercise, and during sleep. Hypoglycemia symptoms can include:  Tremors or shakes.  Decreased ability to concentrate.  Sweating.  Increased heart rate.  Headache.  Dry mouth.  Hunger.  Irritability.  Anxiety.  Restless sleep.  Altered speech or coordination.  Confusion.  Treat hypoglycemia promptly. If you are alert and able to safely swallow, follow the 15:15 rule:  Take 15-20 grams of rapid-acting glucose or carbohydrate. Rapid-acting options include glucose gel, glucose tablets, or 4 ounces (120 mL) of fruit juice, regular soda, or low-fat milk.  Check your blood glucose level 15 minutes after taking the glucose.  Take 15-20 grams more of glucose if the repeat blood glucose level is still 70 mg/dL or below.  Eat a meal or snack within 1 hour once blood glucose levels return to normal.  Be alert to feeling very thirsty and urinating more frequently than usual, which are early signs of hyperglycemia. An early awareness of hyperglycemia allows for prompt treatment. Treat hyperglycemia as directed by your health care provider.  Engage in at least 150 minutes of moderate-intensity physical activity a week, spread over at least 3 days of the week or as directed by your health care provider. In addition, you should engage in resistance exercise at least 2 times a week or as directed by your health care provider. Try to spend no more than 90 minutes at one time inactive.  Adjust your medicine and food intake as needed if you start a new exercise or sport.  Follow your sick-day plan anytime you are unable to eat or drink as usual.  Do not use any  tobacco products including cigarettes, chewing tobacco, or electronic cigarettes. If you need help quitting, ask your health care provider.  Limit alcohol intake to no more than 1 drink per day for nonpregnant women and 2 drinks per day for men. You should drink alcohol only when you are also eating food. Talk with your health care provider whether alcohol is safe for you. Tell your health care provider if you drink alcohol several times a week.  Keep all follow-up visits as directed by your health care provider. This is important.  Schedule an eye exam soon after the diagnosis of type 2 diabetes and then annually.  Perform daily skin and foot care. Examine your skin and feet daily for cuts, bruises, redness, nail problems, bleeding, blisters, or sores. A foot exam by a health care provider should be done annually.  Brush your teeth and gums at least twice a day and floss at least once a day. Follow up with your dentist regularly.  Share your diabetes management plan with your workplace or school.  Keep your immunizations up to date. It is recommended that you receive a flu (influenza) vaccine every year. It is also recommended that you receive a pneumonia (pneumococcal) vaccine. If you are 105 years of age or older and have never received a pneumonia vaccine, this vaccine may be given as a series of two separate shots. Ask your health care provider which additional vaccines may be recommended.  Learn to manage stress.  Obtain ongoing diabetes education and support as needed.  Participate in or seek rehabilitation as needed to maintain or improve independence and quality of life. Request a physical or occupational therapy referral if you are having foot or hand numbness, or difficulties with grooming, dressing, eating, or physical activity. SEEK MEDICAL CARE IF:   You are unable to eat  food or drink fluids for more than 6 hours.  You have nausea and vomiting for more than 6 hours.  Your  blood glucose level is over 240 mg/dL.  There is a change in mental status.  You develop an additional serious illness.  You have diarrhea for more than 6 hours.  You have been sick or have had a fever for a couple of days and are not getting better.  You have pain during any physical activity.  SEEK IMMEDIATE MEDICAL CARE IF:  You have difficulty breathing.  You have moderate to large ketone levels.   This information is not intended to replace advice given to you by your health care provider. Make sure you discuss any questions you have with your health care provider.   Document Released: 04/28/2005 Document Revised: 01/17/2015 Document Reviewed: 11/25/2011 Elsevier Interactive Patient Education [employer].

## 2015-05-12 NOTE — Progress Notes (Signed)
Chief Complaint:  Chief Complaint  Patient presents with  . Annual Exam    CPE with flu shot     HPI: Donald [name] is a 54 y.o. male who is here for his yearly physical. He has a history of  Obesity. He has a history diet-controlled. He has obstructive sleep apnea. He does not smoke. He is not currently exercising.  Past Medical History  Diagnosis Date  . Anemia   . DM (diabetes mellitus) ([place])   . Sleep apnea     on CPAP  . Obesity   . Hypertension    Past Surgical History  Procedure Laterality Date  . Foot surgery    . Colonoscopy  2009   Social History   Social History  . Marital Status: Married    Spouse Name: N/A  . Number of Children: N/A  . Years of Education: N/A   Occupational History  . chef    Social History Main Topics  . Smoking status: Never Smoker   . Smokeless tobacco: None  . Alcohol Use: No  . Drug Use: No  . Sexual Activity: Not Asked   Other Topics Concern  . None   Social History Narrative   Exercise calisthenics   Family History  Problem Relation Age of Onset  . Diabetes Sister 35  . Colon cancer Neg Hx    No Known Allergies Prior to Admission medications   Medication Sig Start Date End Date Taking? Authorizing Provider  aspirin 81 MG tablet Take 81 mg by mouth daily.     Yes Historical Provider, MD  cholecalciferol (VITAMIN D) 1000 UNITS tablet Take 1,000 Units by mouth daily.   Yes Historical Provider, MD  Multiple Vitamin (MULTIVITAMIN) tablet Take 1 tablet by mouth daily.   Yes Historical Provider, MD     ROS: The patient denies fevers, chills, night sweats, unintentional weight loss, chest pain, palpitations, wheezing, dyspnea on exertion, nausea, vomiting, abdominal pain, dysuria, hematuria, melena, numbness, weakness, or tingling. He did have a nose bleed recently and would like that checked  All other systems have been reviewed and were otherwise negative with the exception of those mentioned in the HPI and as  above.    PHYSICAL EXAM: Filed Vitals:   05/12/15 1125  BP: 140/80  Pulse: 80  Temp: 98.1 F (36.7 C)  Resp: 18   Filed Vitals:   05/12/15 1125  Height: 5\' 10"  (1.778 m)  Weight: 292 lb 9.6 oz (132.722 kg)   Body mass index is 41.98 kg/(m^2).   General: Alert, no acute distress .He is morbidly obese HEENT:  Normocephalic, atraumatic, oropharynx patent. EOMI, PERRLA Cardiovascular:  Regular rate and rhythm, no rubs murmurs or gallops.  No Carotid bruits, radial pulse intact. No pedal edema.  Respiratory: Clear to auscultation bilaterally.  No wheezes, rales, or rhonchi.  No cyanosis, no use of accessory musculature GI: No organomegaly, abdomen is soft and non-tender, positive bowel sounds.  No masses. Skin: No rashes. Neurologic: Facial musculature symmetric. Psychiatric: Patient is appropriate throughout our interaction. Lymphatic: No cervical lymphadenopathy Musculoskeletal: Gait intact.   LABS: Results for orders placed or performed in visit on 10/19/14  HM DIABETES EYE EXAM  Result Value Ref Range   HM Diabetic Eye Exam  No Retinopathy   Results for orders placed or performed in visit on 05/12/15  POCT CBC  Result Value Ref Range   WBC 5.9 4.6 - 10.2 K/uL   Lymph, poc 2.3 0.6 -  3.4   POC LYMPH PERCENT 38.5 10 - 50 %L   MID (cbc) 0.3 0 - 0.9   POC MID % 5.5 0 - 12 %M   POC Granulocyte 3.3 2 - 6.9   Granulocyte percent 56.0 37 - 80 %G   RBC 5.15 4.69 - 6.13 M/uL   Hemoglobin 13.4 (A) 14.1 - 18.1 g/dL   HCT, POC 39.6 (A) 43.5 - 53.7 %   MCV 76.9 (A) 80 - 97 fL   MCH, POC 26.0 (A) 27 - 31.2 pg   MCHC 33.8 31.8 - 35.4 g/dL   RDW, POC 14.1 %   Platelet Count, POC 225 142 - 424 K/uL   MPV 6.4 0 - 99.8 fL  POCT glucose (manual entry)  Result Value Ref Range   POC Glucose 121 (A) 70 - 99 mg/dl  POCT glycosylated hemoglobin (Hb A1C)  Result Value Ref Range   Hemoglobin A1C 6.4   POCT urinalysis dipstick  Result Value Ref Range   Color, UA yellow yellow    Clarity, UA clear clear   Glucose, UA negative negative   Bilirubin, UA negative negative   Ketones, POC UA negative negative   Spec Grav, UA 1.020    Blood, UA trace-lysed (A) negative   pH, UA 6.5    Protein Ur, POC negative negative   Urobilinogen, UA 0.2    Nitrite, UA Negative Negative   Leukocytes, UA Negative Negative    EKG/XRAY:   Primary read interpreted by Dr. Everlene Farrier at [email or web address]  Sinus rhythm   ASSESSMENT/PLAN: There are no diagnoses linked to this encounter.  1. Annual physical exam  - POCT CBC - POC Hemoccult Bld/Stl (3-Cd Home Screen); Future - EKG 12-Lead  2. Flu vaccine need  - Flu Vaccine QUAD 36+ mos IM  3. Prediabetes Hb a1c 6.4 - POCT glucose (manual entry) - POCT glycosylated hemoglobin (Hb A1C) - POCT urinalysis dipstick  4. Morbid obesity due to excess calories (HCC) tthis is his greatest problem - COMPLETE METABOLIC PANEL WITH GFR - TSH  5. Screening for prostate cancer  - PSA  6. Need for hepatitis C screening test   7. Screening for lipid disorders  - Lipid panel  8. OSA on CPAP  - EKG 12-Lead   I personally performed the services described in this documentation, which was scribed in my presence. The recorded information has been reviewed and is accurate.  Arlyss Queen, MD  [facility]  05/12/2015 12:52 PM  Gross sideeffects, risk and benefits, and alternatives of medications d/w patient. Patient is aware that all medications have potential sideeffects and we are unable to predict every sideeffect or drug-drug interaction that may occur.  @[facility] @ 05/12/2015 12:12 PM

## 2015-05-14 LAB — PSA: PSA: 1.01 ng/mL (ref <=4.00)

## 2015-05-18 LAB — POC HEMOCCULT BLD/STL (HOME/3-CARD/SCREEN)
Card #3 Fecal Occult Blood, POC: NEGATIVE
FECAL OCCULT BLD: NEGATIVE
FECAL OCCULT BLD: NEGATIVE

## 2015-05-18 NOTE — Addendum Note (Signed)
Addended by: Burnis Kingfisher on: 05/18/2015 04:52 PM   Modules accepted: Orders

## 2015-08-09 ENCOUNTER — Ambulatory Visit (INDEPENDENT_AMBULATORY_CARE_PROVIDER_SITE_OTHER): Payer: 59 | Admitting: Physician Assistant

## 2015-08-09 VITALS — BP 126/80 | HR 91 | Temp 98.0°F | Resp 18 | Ht 70.0 in | Wt 286.4 lb

## 2015-08-09 DIAGNOSIS — M545 Low back pain, unspecified: Secondary | ICD-10-CM

## 2015-08-09 MED ORDER — NAPROXEN 500 MG PO TBEC: 500.0000 mg | DELAYED_RELEASE_TABLET | Freq: Two times a day (BID) | ORAL | Status: DC

## 2015-08-09 MED ORDER — CYCLOBENZAPRINE HCL 10 MG PO TABS: 10.0000 mg | ORAL_TABLET | Freq: Three times a day (TID) | ORAL | Status: DC | PRN

## 2015-08-09 NOTE — Progress Notes (Signed)
08/15/2015 12:29 PM   DOB: October 27, 1960 / MRN: [id number]  SUBJECTIVE:  Donald [name] is a 55 y.o. male presenting for the evaluation of gradually worsening "sharp" midline low back pain that started 1 weeks ago. Associated symptoms include no other symptoms, and he denies weakness, numbness, tingling, dysuria, leg pain, leg weakness, incontinence.Treatments tried thus far include nothing.   He has No Known Allergies.   He  has a past medical history of Anemia; DM (diabetes mellitus) ([place]); Sleep apnea; Obesity; and Hypertension.    He  reports that he has never smoked. He does not have any smokeless tobacco history on file. He reports that he does not drink alcohol or use illicit drugs. He  has no sexual activity history on file. The patient  has past surgical history that includes Foot surgery and Colonoscopy (2009).  His family history includes Diabetes (age of onset: 25) in his sister. There is no history of Colon cancer.  Review of Systems  Constitutional: Negative for fever and chills.  Eyes: Negative for blurred vision.  Respiratory: Negative for cough and shortness of breath.   Cardiovascular: Negative for chest pain.  Gastrointestinal: Negative for nausea and abdominal pain.  Genitourinary: Negative for dysuria, urgency and frequency.  Musculoskeletal: Positive for back pain. Negative for myalgias.  Skin: Negative for rash.  Neurological: Negative for dizziness, tingling and headaches.  Psychiatric/Behavioral: Negative for depression. The patient is not nervous/anxious.     Problem list and medications reviewed and updated by myself where necessary, and exist elsewhere in the encounter.   OBJECTIVE:  BP 126/80 mmHg  Pulse 91  Temp(Src) 98 F (36.7 C) (Oral)  Resp 18  Ht 5\' 10"  (1.778 m)  Wt 286 lb 6.4 oz (129.91 kg)  BMI 41.09 kg/m2  SpO2 98% CrCl cannot be calculated (Patient has no serum creatinine result on file.).  Physical Exam  Constitutional: He is oriented to  person, place, and time. He appears well-developed. He does not appear ill.  Eyes: Conjunctivae and EOM are normal. Pupils are equal, round, and reactive to light.  Cardiovascular: Normal rate.   Pulmonary/Chest: Effort normal.  Abdominal: He exhibits no distension.  Musculoskeletal: Normal range of motion.  Neurological: He is alert and oriented to person, place, and time. No cranial nerve deficit. Coordination normal.  Skin: Skin is warm and dry. He is not diaphoretic.  Psychiatric: He has a normal mood and affect.  Nursing note and vitals reviewed.   Lab Results  Component Value Date   CREATININE 0.63* 05/12/2015     No results found for this or any previous visit (from the past 48 hour(s)).  No results found.  ASSESSMENT AND PLAN  Donald [name] was seen today for back pain.  Diagnoses and all orders for this visit:  Midline low back pain without sciatica: No alarm symptoms.  -     naproxen (EC NAPROSYN) 500 MG EC tablet; Take 1 tablet (500 mg total) by mouth 2 (two) times daily with a meal. -     cyclobenzaprine (FLEXERIL) 10 MG tablet; Take 1 tablet (10 mg total) by mouth 3 (three) times daily as needed for muscle spasms.      The patient was advised to call or return to clinic if he does not see an improvement in symptoms or to seek the care of the closest emergency department if he worsens with the above plan.   Philis Fendt, MHS, PA-C [facility] 08/15/2015 12:29  PM

## 2015-08-09 NOTE — Patient Instructions (Signed)
     IF you received an x-ray today, you will receive an invoice from [facility]. Please contact [facility] at [phone] with questions or concerns regarding your invoice.   IF you received labwork today, you will receive an invoice from [facility]. Please contact [facility] at [phone] with questions or concerns regarding your invoice.   Our billing staff will not be able to assist you with questions regarding bills from these companies.  You will be contacted with the lab results as soon as they are available. The fastest way to get your results is to activate your My Chart account. Instructions are located on the last page of this paperwork. If you have not heard from us regarding the results in 2 weeks, please contact this office.      

## 2015-08-14 MED ORDER — CYCLOBENZAPRINE HCL 10 MG PO TABS: 10.0000 mg | ORAL_TABLET | Freq: Three times a day (TID) | ORAL | Status: DC | PRN

## 2016-03-05 ENCOUNTER — Ambulatory Visit (INDEPENDENT_AMBULATORY_CARE_PROVIDER_SITE_OTHER): Payer: 59 | Admitting: Family Medicine

## 2016-03-05 ENCOUNTER — Ambulatory Visit (INDEPENDENT_AMBULATORY_CARE_PROVIDER_SITE_OTHER): Payer: 59

## 2016-03-05 VITALS — BP 134/84 | HR 89 | Temp 98.3°F | Resp 17 | Ht 70.0 in | Wt 284.0 lb

## 2016-03-05 DIAGNOSIS — M25511 Pain in right shoulder: Secondary | ICD-10-CM

## 2016-03-05 DIAGNOSIS — S42201A Unspecified fracture of upper end of right humerus, initial encounter for closed fracture: Secondary | ICD-10-CM

## 2016-03-05 MED ORDER — TRAMADOL HCL 50 MG PO TABS: 50.0000 mg | ORAL_TABLET | Freq: Three times a day (TID) | ORAL | 0 refills | Status: DC | PRN

## 2016-03-05 NOTE — Patient Instructions (Addendum)
You do have a fracture of your humerus, or upper arm right at the shoulder.    We will put you in a sling to help with comfort.  Take the Tramadol for pain relief for this.  This can help you sleep at night.  We will refer you to [facility].  You can also try calling in the AM to be seen in their Urgent/Walk-in Clinic tomorrow.  Here is their information:  [facility]: Address: [address] [place], [place] [address] Phone: [phone]   Humerus Fracture Treated With Immobilization The humerus is the large bone in your upper arm. You have a broken (fractured) humerus. These fractures are easily diagnosed with X-rays. TREATMENT  Simple fractures which will heal without disability are treated with simple immobilization. Immobilization means you will wear a cast, splint, or sling. You have a fracture which will do well with immobilization. The fracture will heal well simply by being held in a good position until it is stable enough to begin range of motion exercises. Do not take part in activities which would further injure your arm.  HOME CARE INSTRUCTIONS   Put ice on the injured area.  Put ice in a plastic bag.  Place a towel between your skin and the bag.  Leave the ice on for 15-20 minutes, 03-04 times a day.  If you have a cast:  Do not scratch the skin under the cast using sharp or pointed objects.  Check the skin around the cast every day. You may put lotion on any red or sore areas.  Keep your cast dry and clean.  If you have a splint:  Wear the splint as directed.  Keep your splint dry and clean.  You may loosen the elastic around the splint if your fingers become numb, tingle, or turn cold or blue.  If you have a sling:  Wear the sling as directed.  Do not put pressure on any part of your cast or splint until it is fully hardened.  Your cast or splint can be protected during bathing with a plastic bag. Do not lower the cast or splint into  water.  Only take over-the-counter or prescription medicines for pain, discomfort, or fever as directed by your caregiver.  Do range of motion exercises as instructed by your caregiver.  Follow up as directed by your caregiver. This is very important in order to avoid permanent injury or disability and chronic pain. SEEK IMMEDIATE MEDICAL CARE IF:   Your skin or nails in the injured arm turn blue or gray.  Your arm feels cold or numb.  You develop severe pain in the injured arm.  You are having problems with the medicines you were given. MAKE SURE YOU:   Understand these instructions.  Will watch your condition.  Will get help right away if you are not doing well or get worse.   This information is not intended to replace advice given to you by your health care provider. Make sure you discuss any questions you have with your health care provider.   Document Released: 08/04/2000 Document Revised: 05/19/2014 Document Reviewed: 09/20/2014 Elsevier Interactive Patient Education [employer].     IF you received an x-ray today, you will receive an invoice from [facility]. Please contact [facility] at [phone] with questions or concerns regarding your invoice.   IF you received labwork today, you will receive an invoice from [employer]. Please contact [facility] at [phone] with questions or concerns  regarding your invoice.   Our billing staff will not be able to assist you with questions regarding bills from these companies.  You will be contacted with the lab results as soon as they are available. The fastest way to get your results is to activate your My Chart account. Instructions are located on the last page of this paperwork. If you have not heard from [place] regarding the results in 2 weeks, please contact this office.

## 2016-03-05 NOTE — Progress Notes (Signed)
   Donald [name] is a 55 y.o. male who presents to [facility] today for Right shoulder pain:  1.  Right shoulder pain:  Patient was leaving work early today. He tripped on a tree root and fell forward. He caught himself on a car on his outstretched arm. He had immediate pain in his shoulder. Pain is in the front of his shoulder. He has had no prior injury to his shoulder. Before 4:00 today he was having no problems at all.  He has difficulty raising his arm in front of him brought to the side.  No swelling in his arm. No shoulder pain on Left.  No chest pain, SOB.  No rash.   ROS as above.    PMH reviewed. Patient is a nonsmoker.   Past Medical History:  Diagnosis Date  . Anemia   . DM (diabetes mellitus) ([place])   . Hypertension   . Obesity   . Sleep apnea    on CPAP   Past Surgical History:  Procedure Laterality Date  . COLONOSCOPY  2009  . FOOT SURGERY      Medications reviewed. Current Outpatient Prescriptions  Medication Sig Dispense Refill  . aspirin 81 MG tablet Take 81 mg by mouth daily.      . cholecalciferol (VITAMIN D) 1000 UNITS tablet Take 1,000 Units by mouth daily.    . cyclobenzaprine (FLEXERIL) 10 MG tablet Take 1 tablet (10 mg total) by mouth 3 (three) times daily as needed for muscle spasms. 30 tablet 0  . metFORMIN (GLUCOPHAGE XR) 500 MG 24 hr tablet Take 1 daily for the first 2 weeks then 2 tablets daily 60 tablet 11  . Multiple Vitamin (MULTIVITAMIN) tablet Take 1 tablet by mouth daily.    . naproxen (EC NAPROSYN) 500 MG EC tablet Take 1 tablet (500 mg total) by mouth 2 (two) times daily with a meal. (Patient not taking: Reported on 03/05/2016) 30 tablet 0   No current facility-administered medications for this visit.      Physical Exam:  BP 134/84 (BP Location: Left Arm, Patient Position: Sitting, Cuff Size: Large)   Pulse 89   Temp 98.3 F (36.8 C) (Oral)   Resp 17   Ht 5\' 10"  (1.778 m)   Wt 284 lb (128.8 kg)   SpO2 98%   BMI  40.75 kg/m  Gen:  Alert, cooperative patient who appears stated age in no acute distress.  Vital signs reviewed. HEENT: EOMI,  MMM Pulm:  Clear to auscultation bilaterally with good air movement.  No wheezes or rales noted.   Cardiac:  Regular rate and rhythm  MSK: Left shoulder within normal limits. -Right shoulder without atrophy noted. No effusion or redness. Nontender on examination throughout. Does have tenderness with passive or active range of motion anywhere past 30 and forward or lateral planes. Unable to participate in Neer's, supraspinatus, external rotation testing secondary to pain. Skin:  No rash or skin lesions  Assessment and Plan:  1.  Right greater tuberosity fracture of proximal humerus; - radiographs reveal fracture - patient placed in sling.  Referred to [facility] for further management.  Can also call to be seen in their urgent clinic.  - Tramadol for pain relief.

## 2016-03-26 ENCOUNTER — Telehealth: Payer: Self-pay | Admitting: *Deleted

## 2016-03-26 NOTE — Telephone Encounter (Signed)
Patient is requesting refill on meter strips to be sent to [facility]. He came to the office to make request.

## 2016-03-27 ENCOUNTER — Telehealth: Payer: Self-pay

## 2016-03-27 NOTE — Telephone Encounter (Signed)
Patient is calling in regards of a diabetic meter and test strips.  He says nobody called it in for him when he was seen the other day.  [facility] [address]  Please advise  [phone]

## 2016-03-28 ENCOUNTER — Telehealth: Payer: Self-pay | Admitting: Emergency Medicine

## 2016-03-28 MED ORDER — BLOOD GLUCOSE MONITOR KIT: PACK | 0 refills | Status: AC

## 2016-03-28 MED ORDER — BLOOD GLUCOSE METER KIT
PACK | 0 refills | Status: DC
Start: 2016-03-28 — End: 2016-03-28

## 2016-03-28 MED ORDER — BLOOD GLUCOSE METER KIT: PACK | 0 refills | Status: AC

## 2016-03-28 NOTE — Telephone Encounter (Signed)
I have sent these in 

## 2016-03-28 NOTE — Telephone Encounter (Signed)
Tried to call patient to advise strips were sent to the pharmacy, but reached a fax machine.

## 2016-03-28 NOTE — Telephone Encounter (Signed)
Pt returned message. Brand meter preference is One Touch Ulta  Diabetes kit/meter e-scribed to pharmacy. Advised patient to call back if any problems receiving script.

## 2016-03-28 NOTE — Telephone Encounter (Signed)
Diabetes meter and test strips ordered[facility]

## 2016-03-29 ENCOUNTER — Telehealth: Payer: Self-pay

## 2016-03-29 NOTE — Telephone Encounter (Signed)
Patient is completely out of his medications and calling to see if he can get them today

## 2016-03-29 NOTE — Telephone Encounter (Signed)
Pharmacy is calling asking about the meter and test strip request patient is at pharmacy

## 2016-03-31 NOTE — Telephone Encounter (Signed)
duplicate

## 2016-03-31 NOTE — Telephone Encounter (Signed)
LMOM asking pt to CB if his pharm still does not have his meter/test strips Rxs. They have been sent in at least twice to pharm according to prev messages. Advised pt that he is due for f/up for DM and other chronic meds.

## 2016-05-14 DIAGNOSIS — M25511 Pain in right shoulder: Secondary | ICD-10-CM | POA: Diagnosis not present

## 2016-05-14 DIAGNOSIS — S42251D Displaced fracture of greater tuberosity of right humerus, subsequent encounter for fracture with routine healing: Secondary | ICD-10-CM | POA: Diagnosis not present

## 2016-05-21 DIAGNOSIS — M25511 Pain in right shoulder: Secondary | ICD-10-CM | POA: Diagnosis not present

## 2016-05-21 DIAGNOSIS — S42251D Displaced fracture of greater tuberosity of right humerus, subsequent encounter for fracture with routine healing: Secondary | ICD-10-CM | POA: Diagnosis not present

## 2016-06-11 DIAGNOSIS — M79672 Pain in left foot: Secondary | ICD-10-CM | POA: Diagnosis not present

## 2016-06-11 DIAGNOSIS — M79671 Pain in right foot: Secondary | ICD-10-CM | POA: Diagnosis not present

## 2016-06-11 DIAGNOSIS — B351 Tinea unguium: Secondary | ICD-10-CM | POA: Diagnosis not present

## 2016-06-18 DIAGNOSIS — M25511 Pain in right shoulder: Secondary | ICD-10-CM | POA: Diagnosis not present

## 2016-06-20 ENCOUNTER — Other Ambulatory Visit: Payer: Self-pay | Admitting: Emergency Medicine

## 2016-06-21 NOTE — Telephone Encounter (Signed)
04/2015 last ov

## 2016-07-03 ENCOUNTER — Other Ambulatory Visit: Payer: Self-pay | Admitting: Family Medicine

## 2016-07-04 ENCOUNTER — Ambulatory Visit (INDEPENDENT_AMBULATORY_CARE_PROVIDER_SITE_OTHER): Payer: 59 | Admitting: Physician Assistant

## 2016-07-04 VITALS — BP 126/84 | HR 72 | Temp 98.3°F | Resp 16 | Ht 70.0 in | Wt 288.0 lb

## 2016-07-04 DIAGNOSIS — Z23 Encounter for immunization: Secondary | ICD-10-CM | POA: Diagnosis not present

## 2016-07-04 DIAGNOSIS — E119 Type 2 diabetes mellitus without complications: Secondary | ICD-10-CM

## 2016-07-04 MED ORDER — ZOSTER VACCINE LIVE 19400 UNT/0.65ML ~~LOC~~ SUSR: 0.6500 mL | Freq: Once | SUBCUTANEOUS | 0 refills | Status: AC

## 2016-07-04 MED ORDER — METFORMIN HCL ER 500 MG PO TB24: 500.0000 mg | ORAL_TABLET | Freq: Two times a day (BID) | ORAL | 5 refills | Status: DC

## 2016-07-04 NOTE — Progress Notes (Signed)
[facility] [address], [place] [address] [phone]  Date:  07/04/2016   Name:  Donald [name]   DOB:  Aug 01, 1960   MRN:  [id number]  PCP:  [name], MD    History of Present Illness:  Donald [name] is a 56 y.o. male patient who presents to [facility] for cc of medication refill and recheck of his diabetes.   He has been without the metformin for 1.5 weeks.   He is taking his medication every day as prescribed.  He takes 2 per day.   He checks his blood sugar daily.  144 yesterday but ate late.  107 the day prior.    No tremulousness or palpitations.  No diarrhea, nausea, or fatigue.   Polydipsia.  urinary frequency about every hour.  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  No numbness or tingling.     Wt Readings from Last 3 Encounters:  07/04/16 288 lb (130.6 kg)  03/05/16 284 lb (128.8 kg)  08/09/15 286 lb 6.4 oz (129.9 kg)    Patient Active Problem List   Diagnosis Date Noted  . Severe obesity (BMI >= 40) ([place]) 04/09/2013  . Type II or unspecified type diabetes mellitus without mention of complication, not stated as uncontrolled 04/01/2013  . Anemia, unspecified 04/01/2013  . Diabetes mellitus without mention of complication 63/84/5364  . Iron deficiency anemia, unspecified 11/21/2010    Past Medical History:  Diagnosis Date  . Anemia   . DM (diabetes mellitus) ([place])   . Hypertension   . Obesity   . Sleep apnea    on CPAP    Past Surgical History:  Procedure Laterality Date  . COLONOSCOPY  2009  . FOOT SURGERY      Social History  Substance Use Topics  . Smoking status: Never Smoker  . Smokeless tobacco: Never Used  . Alcohol use No    Family History  Problem Relation Age of Onset  . Diabetes Sister 65  . Colon cancer Neg Hx     No Known Allergies  Medication list has been reviewed and updated.  Current Outpatient Prescriptions on File Prior to Visit  Medication Sig Dispense Refill  . aspirin 81 MG tablet  Take 81 mg by mouth daily.      . blood glucose meter kit and supplies KIT Dispense based on patient and insurance preference. Use up to four times daily as directed. (FOR ICD-9 250.00, 250.01). 1 each 0  . blood glucose meter kit and supplies Dispense based on patient and insurance preference. Use up to four times daily as directed. (FOR ICD-9 250.00, 250.01). 1 each 0  . cholecalciferol (VITAMIN D) 1000 UNITS tablet Take 1,000 Units by mouth daily.    . cyclobenzaprine (FLEXERIL) 10 MG tablet Take 1 tablet (10 mg total) by mouth 3 (three) times daily as needed for muscle spasms. 30 tablet 0  . metFORMIN (GLUCOPHAGE-XR) 500 MG 24 hr tablet TAKE 1 DAILY FOR THE FIRST 2 WEEKS THEN 2 TABLETS DAILY 60 tablet 0  . Multiple Vitamin (MULTIVITAMIN) tablet Take 1 tablet by mouth daily.    . traMADol (ULTRAM) 50 MG tablet Take 1-2 tablets (50-100 mg total) by mouth every 8 (eight) hours as needed for moderate pain. 30 tablet 0  . naproxen (EC NAPROSYN) 500 MG EC tablet Take 1 tablet (500 mg total) by mouth 2 (two) times daily with a meal. (Patient not taking: Reported on  07/04/2016) 30 tablet 0   No current facility-administered medications on file prior to visit.     ROS ROS otherwise unremarkable unless listed above.   Physical Examination: BP 126/84   Pulse 72   Temp 98.3 F (36.8 C) (Oral)   Resp 16   Ht 5' 10"  (1.778 m)   Wt 288 lb (130.6 kg)   SpO2 98%   BMI 41.32 kg/m  Ideal Body Weight: Weight in (lb) to have BMI = 25: 173.9  Physical Exam  Constitutional: He is oriented to person, place, and time. He appears well-developed and well-nourished. No distress.  HENT:  Head: Normocephalic and atraumatic.  Eyes: Conjunctivae and EOM are normal. Pupils are equal, round, and reactive to light.  Cardiovascular: Normal rate.   Pulmonary/Chest: Effort normal. No respiratory distress.  Neurological: He is alert and oriented to person, place, and time.  Skin: Skin is warm and dry. He is not  diaphoretic.  Psychiatric: He has a normal mood and affect. His behavior is normal.     Assessment and Plan: Donald [name] is a 56 y.o. male who is here today for cc of diabetes. --advised weight loss, and appropriate exercise and diet.    Type 2 diabetes mellitus without complication, without long-term current use of insulin ([place]) - Plan: Hemoglobin A1c, Lipid panel, CMP14+EGFR, Microalbumin, urine  Need for prophylactic vaccination and inoculation against influenza - Plan: Flu Vaccine QUAD 36+ mos IM  Need for pneumococcal vaccination - Plan: Pneumococcal conjugate vaccine 13-valent IM  Need for shingles vaccine - Plan: Zoster Vaccine Live, PF, (ZOSTAVAX) 35789 UNT/0.65ML injection  Ivar Drape, PA-C [facility] 2/24/20188:18 AM

## 2016-07-04 NOTE — Patient Instructions (Signed)
     IF you received an x-ray today, you will receive an invoice from [facility]. Please contact  Radiology at [phone] with questions or concerns regarding your invoice.   IF you received labwork today, you will receive an invoice from [facility]. Please contact [facility] at [phone] with questions or concerns regarding your invoice.   Our billing staff will not be able to assist you with questions regarding bills from these companies.  You will be contacted with the lab results as soon as they are available. The fastest way to get your results is to activate your My Chart account. Instructions are located on the last page of this paperwork. If you have not heard from us regarding the results in 2 weeks, please contact this office.     

## 2016-07-04 NOTE — Progress Notes (Deleted)
[facility] [address] [place] [address] [phone]  Date:  07/04/2016   Name:  Donald [name]   DOB:  12/12/1960   MRN:  [id number]  PCP:  [name], MD    History of Present Illness:  Donald [name] is a 56 y.o. male patient who presents to [facility]     Patient Active Problem List   Diagnosis Date Noted  . Severe obesity (BMI >= 40) ([place]) 04/09/2013  . Type II or unspecified type diabetes mellitus without mention of complication, not stated as uncontrolled 04/01/2013  . Anemia, unspecified 04/01/2013  . Diabetes mellitus without mention of complication 28/78/6767  . Iron deficiency anemia, unspecified 11/21/2010    Past Medical History:  Diagnosis Date  . Anemia   . DM (diabetes mellitus) ([place])   . Hypertension   . Obesity   . Sleep apnea    on CPAP    Past Surgical History:  Procedure Laterality Date  . COLONOSCOPY  2009  . FOOT SURGERY      Social History  Substance Use Topics  . Smoking status: Never Smoker  . Smokeless tobacco: Never Used  . Alcohol use No    Family History  Problem Relation Age of Onset  . Diabetes Sister 58  . Colon cancer Neg Hx     No Known Allergies  Medication list has been reviewed and updated.  Current Outpatient Prescriptions on File Prior to Visit  Medication Sig Dispense Refill  . aspirin 81 MG tablet Take 81 mg by mouth daily.      . blood glucose meter kit and supplies KIT Dispense based on patient and insurance preference. Use up to four times daily as directed. (FOR ICD-9 250.00, 250.01). 1 each 0  . blood glucose meter kit and supplies Dispense based on patient and insurance preference. Use up to four times daily as directed. (FOR ICD-9 250.00, 250.01). 1 each 0  . cholecalciferol (VITAMIN D) 1000 UNITS tablet Take 1,000 Units by mouth daily.    . cyclobenzaprine (FLEXERIL) 10 MG tablet Take 1 tablet (10 mg total) by mouth 3 (three) times daily as needed for muscle spasms. 30 tablet 0  .  metFORMIN (GLUCOPHAGE-XR) 500 MG 24 hr tablet TAKE 1 DAILY FOR THE FIRST 2 WEEKS THEN 2 TABLETS DAILY 60 tablet 0  . Multiple Vitamin (MULTIVITAMIN) tablet Take 1 tablet by mouth daily.    . traMADol (ULTRAM) 50 MG tablet Take 1-2 tablets (50-100 mg total) by mouth every 8 (eight) hours as needed for moderate pain. 30 tablet 0  . naproxen (EC NAPROSYN) 500 MG EC tablet Take 1 tablet (500 mg total) by mouth 2 (two) times daily with a meal. (Patient not taking: Reported on 07/04/2016) 30 tablet 0   No current facility-administered medications on file prior to visit.     ROS   Physical Examination: BP 126/84   Pulse 72   Temp 98.3 F (36.8 C) (Oral)   Resp 16   Ht _0  (1.778 m)   Wt 288 lb (130.6 kg)   SpO2 98%   BMI 41.32 kg/m  Ideal Body Weight: Weight in (lb) to have BMI = 25: 173.9  Physical Exam   Assessment and Plan: Donald [name] is a 56 y.o. male who is here today  There are no diagnoses linked to this encounter.  Ivar Drape, PA-C [facility] 07/04/2016 9:25 AM

## 2016-07-05 LAB — HEMOGLOBIN A1C
ESTIMATED AVERAGE GLUCOSE: 134 mg/dL
Hgb A1c MFr Bld: 6.3 % — ABNORMAL HIGH (ref 4.8–5.6)

## 2016-07-05 LAB — CMP14+EGFR
A/G RATIO: 1 — AB (ref 1.2–2.2)
ALBUMIN: 3.8 g/dL (ref 3.5–5.5)
ALK PHOS: 60 IU/L (ref 39–117)
ALT: 19 IU/L (ref 0–44)
AST: 16 IU/L (ref 0–40)
BILIRUBIN TOTAL: 0.7 mg/dL (ref 0.0–1.2)
BUN / CREAT RATIO: 17 (ref 9–20)
BUN: 12 mg/dL (ref 6–24)
CHLORIDE: 101 mmol/L (ref 96–106)
CO2: 24 mmol/L (ref 18–29)
Calcium: 9.3 mg/dL (ref 8.7–10.2)
Creatinine, Ser: 0.72 mg/dL — ABNORMAL LOW (ref 0.76–1.27)
GFR calc Af Amer: 120 mL/min/{1.73_m2} (ref >59)
GFR calc non Af Amer: 104 mL/min/{1.73_m2} (ref >59)
GLUCOSE: 114 mg/dL — AB (ref 65–99)
Globulin, Total: 3.8 g/dL (ref 1.5–4.5)
POTASSIUM: 4.5 mmol/L (ref 3.5–5.2)
SODIUM: 140 mmol/L (ref 134–144)
Total Protein: 7.6 g/dL (ref 6.0–8.5)

## 2016-07-05 LAB — LIPID PANEL
CHOL/HDL RATIO: 5.3 ratio — AB (ref 0.0–5.0)
Cholesterol, Total: 171 mg/dL (ref 100–199)
HDL: 32 mg/dL — AB (ref >39)
LDL Calculated: 110 mg/dL — ABNORMAL HIGH (ref 0–99)
TRIGLYCERIDES: 144 mg/dL (ref 0–149)
VLDL CHOLESTEROL CAL: 29 mg/dL (ref 5–40)

## 2016-07-05 LAB — MICROALBUMIN, URINE

## 2016-07-15 ENCOUNTER — Other Ambulatory Visit: Payer: Self-pay | Admitting: Physician Assistant

## 2016-07-15 NOTE — Progress Notes (Signed)
Ordered shingrix as he reports that his pharmacy does not have the zostavax available

## 2016-08-04 DIAGNOSIS — B351 Tinea unguium: Secondary | ICD-10-CM | POA: Diagnosis not present

## 2016-08-04 DIAGNOSIS — M79671 Pain in right foot: Secondary | ICD-10-CM | POA: Diagnosis not present

## 2016-08-04 DIAGNOSIS — M79672 Pain in left foot: Secondary | ICD-10-CM | POA: Diagnosis not present

## 2016-09-01 DIAGNOSIS — M79671 Pain in right foot: Secondary | ICD-10-CM | POA: Diagnosis not present

## 2016-09-01 DIAGNOSIS — M79672 Pain in left foot: Secondary | ICD-10-CM | POA: Diagnosis not present

## 2016-09-01 DIAGNOSIS — B351 Tinea unguium: Secondary | ICD-10-CM | POA: Diagnosis not present

## 2016-11-13 ENCOUNTER — Ambulatory Visit (INDEPENDENT_AMBULATORY_CARE_PROVIDER_SITE_OTHER): Payer: 59 | Admitting: Physician Assistant

## 2016-11-13 ENCOUNTER — Encounter: Payer: Self-pay | Admitting: Physician Assistant

## 2016-11-13 DIAGNOSIS — M545 Low back pain, unspecified: Secondary | ICD-10-CM

## 2016-11-13 MED ORDER — CYCLOBENZAPRINE HCL 10 MG PO TABS: 5.0000 mg | ORAL_TABLET | Freq: Three times a day (TID) | ORAL | 0 refills | Status: DC | PRN

## 2016-11-13 MED ORDER — MELOXICAM 15 MG PO TABS: 15.0000 mg | ORAL_TABLET | Freq: Every day | ORAL | 0 refills | Status: DC

## 2016-11-13 NOTE — Patient Instructions (Signed)
Ice the lower back three times per day for 15 minutes.   I would like you to do three of these stretches, three times per day. Each time you do the course of stretches, ice directly after. Do not take the mobic with ibuprofen or naproxen.  You are able to take tylenol if needed.   Low Back Sprain Rehab Ask your health care provider which exercises are safe for you. Do exercises exactly as told by your health care provider and adjust them as directed. It is normal to feel mild stretching, pulling, tightness, or discomfort as you do these exercises, but you should stop right away if you feel sudden pain or your pain gets worse. Do not begin these exercises until told by your health care provider. Stretching and range of motion exercises These exercises warm up your muscles and joints and improve the movement and flexibility of your back. These exercises also help to relieve pain, numbness, and tingling. Exercise A: Lumbar rotation  1. Lie on your back on a firm surface and bend your knees. 2. Straighten your arms out to your sides so each arm forms an "L" shape with a side of your body (a 90 degree angle). 3. Slowly move both of your knees to one side of your body until you feel a stretch in your lower back. Try not to let your shoulders move off of the floor. 4. Hold for __________ seconds. 5. Tense your abdominal muscles and slowly move your knees back to the starting position. 6. Repeat this exercise on the other side of your body. Repeat __________ times. Complete this exercise __________ times a day. Exercise B: Prone extension on elbows  1. Lie on your abdomen on a firm surface. 2. Prop yourself up on your elbows. 3. Use your arms to help lift your chest up until you feel a gentle stretch in your abdomen and your lower back. ? This will place some of your body weight on your elbows. If this is uncomfortable, try stacking pillows under your chest. ? Your hips should stay down, against the  surface that you are lying on. Keep your hip and back muscles relaxed. 4. Hold for __________ seconds. 5. Slowly relax your upper body and return to the starting position. Repeat __________ times. Complete this exercise __________ times a day. Strengthening exercises These exercises build strength and endurance in your back. Endurance is the ability to use your muscles for a long time, even after they get tired. Exercise C: Pelvic tilt 1. Lie on your back on a firm surface. Bend your knees and keep your feet flat. 2. Tense your abdominal muscles. Tip your pelvis up toward the ceiling and flatten your lower back into the floor. ? To help with this exercise, you may place a small towel under your lower back and try to push your back into the towel. 3. Hold for __________ seconds. 4. Let your muscles relax completely before you repeat this exercise. Repeat __________ times. Complete this exercise __________ times a day. Exercise D: Alternating arm and leg raises  1. Get on your hands and knees on a firm surface. If you are on a hard floor, you may want to use padding to cushion your knees, such as an exercise mat. 2. Line up your arms and legs. Your hands should be below your shoulders, and your knees should be below your hips. 3. Lift your left leg behind you. At the same time, raise your right arm and straighten it  in front of you. ? Do not lift your leg higher than your hip. ? Do not lift your arm higher than your shoulder. ? Keep your abdominal and back muscles tight. ? Keep your hips facing the ground. ? Do not arch your back. ? Keep your balance carefully, and do not hold your breath. 4. Hold for __________ seconds. 5. Slowly return to the starting position and repeat with your right leg and your left arm. Repeat __________ times. Complete this exercise __________ times a day. Exercise E: Abdominal set with straight leg raise  1. Lie on your back on a firm surface. 2. Bend one of  your knees and keep your other leg straight. 3. Tense your abdominal muscles and lift your straight leg up, 4-6 inches (10-15 cm) off the ground. 4. Keep your abdominal muscles tight and hold for __________ seconds. ? Do not hold your breath. ? Do not arch your back. Keep it flat against the ground. 5. Keep your abdominal muscles tense as you slowly lower your leg back to the starting position. 6. Repeat with your other leg. Repeat __________ times. Complete this exercise __________ times a day. Posture and body mechanics  Body mechanics refers to the movements and positions of your body while you do your daily activities. Posture is part of body mechanics. Good posture and healthy body mechanics can help to relieve stress in your body's tissues and joints. Good posture means that your spine is in its natural S-curve position (your spine is neutral), your shoulders are pulled back slightly, and your head is not tipped forward. The following are general guidelines for applying improved posture and body mechanics to your everyday activities. Standing   When standing, keep your spine neutral and your feet about hip-width apart. Keep a slight bend in your knees. Your ears, shoulders, and hips should line up.  When you do a task in which you stand in one place for a long time, place one foot up on a stable object that is 2-4 inches (5-10 cm) high, such as a footstool. This helps keep your spine neutral. Sitting   When sitting, keep your spine neutral and keep your feet flat on the floor. Use a footrest, if necessary, and keep your thighs parallel to the floor. Avoid rounding your shoulders, and avoid tilting your head forward.  When working at a desk or a computer, keep your desk at a height where your hands are slightly lower than your elbows. Slide your chair under your desk so you are close enough to maintain good posture.  When working at a computer, place your monitor at a height where you  are looking straight ahead and you do not have to tilt your head forward or downward to look at the screen. Resting   When lying down and resting, avoid positions that are most painful for you.  If you have pain with activities such as sitting, bending, stooping, or squatting (flexion-based activities), lie in a position in which your body does not bend very much. For example, avoid curling up on your side with your arms and knees near your chest (fetal position).  If you have pain with activities such as standing for a long time or reaching with your arms (extension-based activities), lie with your spine in a neutral position and bend your knees slightly. Try the following positions:  Lying on your side with a pillow between your knees.  Lying on your back with a pillow under your knees. Lifting  When lifting objects, keep your feet at least shoulder-width apart and tighten your abdominal muscles.  Bend your knees and hips and keep your spine neutral. It is important to lift using the strength of your legs, not your back. Do not lock your knees straight out.  Always ask for help to lift heavy or awkward objects. This information is not intended to replace advice given to you by your health care provider. Make sure you discuss any questions you have with your health care provider. Document Released: 04/28/2005 Document Revised: 01/03/2016 Document Reviewed: 02/07/2015 Elsevier Interactive Patient Education  [employer].

## 2016-11-13 NOTE — Progress Notes (Signed)
PRIMARY CARE AT [facility] [address] [place] [address] [phone]  Date:  11/13/2016   Name:  Donald [name]   DOB:  August 13, 1960   MRN:  [id number]  PCP:  [name], MD    History of Present Illness:  Donald [name] is a 55 y.o. male patient who presents to PCP with  Chief Complaint  Patient presents with  . Hip Pain    Left hip pain x 2 months   . Back Pain    Lower back pain    --patient reports 2 months of back and hip pain.  The pain is localized to the center of his low back and radiates to the left side of low back.  No numbness or tingling.  No weakness.  He works as a Training and development officer.  He is up all day, but is not engaging in any heavy lifting.  No hx of injuring back or issue.    Patient Active Problem List   Diagnosis Date Noted  . Severe obesity (BMI >= 40) ([place]) 04/09/2013  . Type II or unspecified type diabetes mellitus without mention of complication, not stated as uncontrolled 04/01/2013  . Anemia, unspecified 04/01/2013  . Diabetes mellitus without mention of complication 78/93/8101  . Iron deficiency anemia, unspecified 11/21/2010    Past Medical History:  Diagnosis Date  . Anemia   . DM (diabetes mellitus) ([place])   . Hypertension   . Obesity   . Sleep apnea    on CPAP    Past Surgical History:  Procedure Laterality Date  . COLONOSCOPY  2009  . FOOT SURGERY      Social History  Substance Use Topics  . Smoking status: Never Smoker  . Smokeless tobacco: Never Used  . Alcohol use No    Family History  Problem Relation Age of Onset  . Diabetes Sister 61  . Colon cancer Neg Hx     No Known Allergies  Medication list has been reviewed and updated.  Current Outpatient Prescriptions on File Prior to Visit  Medication Sig Dispense Refill  . aspirin 81 MG tablet Take 81 mg by mouth daily.      . blood glucose meter kit and supplies KIT Dispense based on patient and insurance preference. Use up to four times daily as directed. (FOR ICD-9 250.00, 250.01).  1 each 0  . blood glucose meter kit and supplies Dispense based on patient and insurance preference. Use up to four times daily as directed. (FOR ICD-9 250.00, 250.01). 1 each 0  . cholecalciferol (VITAMIN D) 1000 UNITS tablet Take 1,000 Units by mouth daily.    . metFORMIN (GLUCOPHAGE-XR) 500 MG 24 hr tablet Take 1 tablet (500 mg total) by mouth 2 (two) times daily. 60 tablet 5  . Multiple Vitamin (MULTIVITAMIN) tablet Take 1 tablet by mouth daily.    Marland Kitchen UNABLE TO FIND [place]    . cyclobenzaprine (FLEXERIL) 10 MG tablet Take 1 tablet (10 mg total) by mouth 3 (three) times daily as needed for muscle spasms. (Patient not taking: Reported on 11/13/2016) 30 tablet 0  . naproxen (EC NAPROSYN) 500 MG EC tablet Take 1 tablet (500 mg total) by mouth 2 (two) times daily with a meal. (Patient not taking: Reported on 07/04/2016) 30 tablet 0  . TERBINAFINE HCL PO Take by mouth daily.    . traMADol (ULTRAM) 50 MG tablet Take 1-2 tablets (50-100 mg total) by mouth every 8 (eight) hours as needed for moderate pain. (Patient not  taking: Reported on 11/13/2016) 30 tablet 0   No current facility-administered medications on file prior to visit.     ROS ROS otherwise unremarkable unless listed above.  Physical Examination: BP (!) 144/85   Pulse 76   Temp 98.9 F (37.2 C) (Oral)   Resp 16   Ht _0  (1.778 m)   Wt 288 lb 3.2 oz (130.7 kg)   SpO2 98%   BMI 41.35 kg/m  Ideal Body Weight: Weight in (lb) to have BMI = 25: 173.9  Physical Exam  Constitutional: He is oriented to person, place, and time. He appears well-developed and well-nourished. No distress.  HENT:  Head: Normocephalic and atraumatic.  Eyes: Conjunctivae and EOM are normal. Pupils are equal, round, and reactive to light.  Cardiovascular: Normal rate.   Pulmonary/Chest: Effort normal. No respiratory distress.  Musculoskeletal:       Lumbar back: He exhibits normal range of motion, no tenderness and no bony tenderness.  Negative straight  leg raise test bilaterally Normal lower extremity strength.   Neurological: He is alert and oriented to person, place, and time.  Skin: Skin is warm and dry. He is not diaphoretic.  Psychiatric: He has a normal mood and affect. His behavior is normal.     Assessment and Plan: Donald [name] is a 56 y.o. male who is here today for cc of low back pain. Advised anti-inflammatory and icing.  Advised precaution with medication.  Given stretches to perform daily Follow up in 2 weeks if no improvement Acute midline low back pain without sciatica - Plan: meloxicam (MOBIC) 15 MG tablet, cyclobenzaprine (FLEXERIL) 10 MG tablet  Ivar Drape, PA-C [facility] 7/8/20187:29 AM

## 2016-12-14 ENCOUNTER — Other Ambulatory Visit: Payer: Self-pay | Admitting: Physician Assistant

## 2017-03-23 ENCOUNTER — Ambulatory Visit (INDEPENDENT_AMBULATORY_CARE_PROVIDER_SITE_OTHER): Payer: 59 | Admitting: Family Medicine

## 2017-03-23 ENCOUNTER — Ambulatory Visit (INDEPENDENT_AMBULATORY_CARE_PROVIDER_SITE_OTHER): Payer: 59

## 2017-03-23 ENCOUNTER — Other Ambulatory Visit: Payer: Self-pay

## 2017-03-23 ENCOUNTER — Encounter: Payer: Self-pay | Admitting: Family Medicine

## 2017-03-23 VITALS — BP 138/84 | HR 78 | Temp 98.5°F | Resp 16 | Ht 70.0 in | Wt 258.4 lb

## 2017-03-23 DIAGNOSIS — M545 Low back pain, unspecified: Secondary | ICD-10-CM

## 2017-03-23 DIAGNOSIS — M25552 Pain in left hip: Secondary | ICD-10-CM

## 2017-03-23 DIAGNOSIS — M1612 Unilateral primary osteoarthritis, left hip: Secondary | ICD-10-CM | POA: Diagnosis not present

## 2017-03-23 DIAGNOSIS — Z23 Encounter for immunization: Secondary | ICD-10-CM | POA: Diagnosis not present

## 2017-03-23 MED ORDER — MELOXICAM 7.5 MG PO TABS: 7.5000 mg | ORAL_TABLET | Freq: Every day | ORAL | 0 refills | Status: DC

## 2017-03-23 NOTE — Patient Instructions (Addendum)
Your hip pain may be coming form your back. I did see a possible compression fracture of L2, but that may be from fall last year as not painful in that area. There was some arthritis in the hip as well. Physical therapy may be helpful for both. I will refer you.   Stop the [place], and try meloxicam one to two per day at the most. We will try to use this short term due to the possible cardiac risks we discussed.  Ok to take tylenol with meloxicam if needed, but no other over the counter pain relievers.   Recheck in next 2-3 weeks. Return to the clinic or go to the nearest emergency room if any of your symptoms worsen or new symptoms occur.  Back Pain, Adult Back pain is very common in adults.The cause of back pain is rarely dangerous and the pain often gets better over time.The cause of your back pain may not be known. Some common causes of back pain include:  Strain of the muscles or ligaments supporting the spine.  Wear and tear (degeneration) of the spinal disks.  Arthritis.  Direct injury to the back.  For many people, back pain may return. Since back pain is rarely dangerous, most people can learn to manage this condition on their own. Follow these instructions at home: Watch your back pain for any changes. The following actions may help to lessen any discomfort you are feeling:  Remain active. It is stressful on your back to sit or stand in one place for long periods of time. Do not sit, drive, or stand in one place for more than 30 minutes at a time. Take short walks on even surfaces as soon as you are able.Try to increase the length of time you walk each day.  Exercise regularly as directed by your health care provider. Exercise helps your back heal faster. It also helps avoid future injury by keeping your muscles strong and flexible.  Do not stay in bed.Resting more than 1-2 days can delay your recovery.  Pay attention to your body when you bend and lift. The most comfortable  positions are those that put less stress on your recovering back. Always use proper lifting techniques, including: ? Bending your knees. ? Keeping the load close to your body. ? Avoiding twisting.  Find a comfortable position to sleep. Use a firm mattress and lie on your side with your knees slightly bent. If you lie on your back, put a pillow under your knees.  Avoid feeling anxious or stressed.Stress increases muscle tension and can worsen back pain.It is important to recognize when you are anxious or stressed and learn ways to manage it, such as with exercise.  Take medicines only as directed by your health care provider. Over-the-counter medicines to reduce pain and inflammation are often the most helpful.Your health care provider may prescribe muscle relaxant drugs.These medicines help dull your pain so you can more quickly return to your normal activities and healthy exercise.  Apply ice to the injured area: ? Put ice in a plastic bag. ? Place a towel between your skin and the bag. ? Leave the ice on for 20 minutes, 2-3 times a day for the first 2-3 days. After that, ice and heat may be alternated to reduce pain and spasms.  Maintain a healthy weight. Excess weight puts extra stress on your back and makes it difficult to maintain good posture.  Contact a health care provider if:  You have pain that  is not relieved with rest or medicine.  You have increasing pain going down into the legs or buttocks.  You have pain that does not improve in one week.  You have night pain.  You lose weight.  You have a fever or chills. Get help right away if:  You develop new bowel or bladder control problems.  You have unusual weakness or numbness in your arms or legs.  You develop nausea or vomiting.  You develop abdominal pain.  You feel faint. This information is not intended to replace advice given to you by your health care provider. Make sure you discuss any questions you have  with your health care provider. Document Released: 04/28/2005 Document Revised: 09/06/2015 Document Reviewed: 08/30/2013 Elsevier Interactive Patient Education  2017 [employer]  Hip Pain The hip is the joint between the upper legs and the lower pelvis. The bones, cartilage, tendons, and muscles of your hip joint support your body and allow you to move around. Hip pain can range from a minor ache to severe pain in one or both of your hips. The pain may be felt on the inside of the hip joint near the groin, or the outside near the buttocks and upper thigh. You may also have swelling or stiffness. Follow these instructions at home: Managing pain, stiffness, and swelling  If directed, apply ice to the injured area. ? Put ice in a plastic bag. ? Place a towel between your skin and the bag. ? Leave the ice on for 20 minutes, 2-3 times a day  Sleep with a pillow between your legs on your most comfortable side.  Avoid any activities that cause pain. General instructions  Take over-the-counter and prescription medicines only as told by your health care provider.  Do any exercises as told by your health care provider.  Record the following: ? How often you have hip pain. ? The location of your pain. ? What the pain feels like. ? What makes the pain worse.  Keep all follow-up visits as told by your health care provider. This is important. Contact a health care provider if:  You cannot put weight on your leg.  Your pain or swelling continues or gets worse after one week.  It gets harder to walk.  You have a fever. Get help right away if:  You fall.  You have a sudden increase in pain and swelling in your hip.  Your hip is red or swollen or very tender to touch. Summary  Hip pain can range from a minor ache to severe pain in one or both of your hips.  The pain may be felt on the inside of the hip joint near the groin, or the outside near the buttocks and upper  thigh.  Avoid any activities that cause pain.  Record how often you have hip pain, the location of the pain, what makes it worse and what it feels like. This information is not intended to replace advice given to you by your health care provider. Make sure you discuss any questions you have with your health care provider. Document Released: 10/16/2009 Document Revised: 03/31/2016 Document Reviewed: 03/31/2016 Elsevier Interactive Patient Education  2017 [employer].    IF you received an x-ray today, you will receive an invoice from [facility]. Please contact [facility] at [phone] with questions or concerns regarding your invoice.   IF you received labwork today, you will receive an invoice from [facility]. Please contact [facility] at [phone] with questions or  concerns regarding your invoice.   Our billing staff will not be able to assist you with questions regarding bills from these companies.  You will be contacted with the lab results as soon as they are available. The fastest way to get your results is to activate your My Chart account. Instructions are located on the last page of this paperwork. If you have not heard from [place] regarding the results in 2 weeks, please contact this office.

## 2017-03-23 NOTE — Progress Notes (Signed)
Subjective:  This chart was scribed for Wendie Agreste, MD by Tamsen Roers, at [place] at [facility].  This patient was seen in room 9 and the patient's care was started at 10:13 AM    Chief Complaint  Patient presents with  . Hip Pain    L side hip pain x 6 months, no swelling or bruising noted, had a fall last year, R side     Patient ID: Donald [name], male    DOB: 03-01-1961, 56 y.o.   MRN: [id number]  HPI  HPI Comments: Donald [name] is a 56 y.o. male who presents to Primary Care at [facility] complaining of non radiating left sided hip pain onset a couple months ago (May- June) but states that it has progressively worsened.  He has difficulty raising his leg or putting on his socks. He has been taking Aleve (daily- about 4 pills per day) and tylenol (500 mg) for relief.  He has some dribbling. Denies numbness in groin,  incontinence, leg weakness, saddle anesthesia.   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  Patient fell off a ladder last year cleaning the gutters and had some soreness in his hip at that time. He has come into the office previously and seen Colletta [facility]) who gave him stretching exercises (which he has been compliant with doing during breaks at work).     Patient Active Problem List   Diagnosis Date Noted  . Severe obesity (BMI >= 40) ([place]) 04/09/2013  . Type II or unspecified type diabetes mellitus without mention of complication, not stated as uncontrolled 04/01/2013  . Anemia, unspecified 04/01/2013  . Diabetes mellitus without mention of complication 79/39/0300  . Iron deficiency anemia, unspecified 11/21/2010   Past Medical History:  Diagnosis Date  . Anemia   . DM (diabetes mellitus) ([place])   . Hypertension   . Obesity   . Sleep apnea    on CPAP   Past Surgical History:  Procedure Laterality Date  . COLONOSCOPY  2009  . FOOT SURGERY     No Known Allergies Prior to Admission  medications   Medication Sig Start Date End Date Taking? Authorizing Provider  acetaminophen (TYLENOL) 500 MG tablet Take 1,000 mg by mouth every 6 (six) hours as needed.    [provider]  aspirin 81 MG tablet Take 81 mg by mouth daily.      [provider]  blood glucose meter kit and supplies KIT Dispense based on patient and insurance preference. Use up to four times daily as directed. (FOR ICD-9 250.00, 250.01). 03/28/16   Alveda Reasons, MD  blood glucose meter kit and supplies Dispense based on patient and insurance preference. Use up to four times daily as directed. (FOR ICD-9 250.00, 250.01). 03/28/16   [name], MD  cholecalciferol (VITAMIN D) 1000 UNITS tablet Take 1,000 Units by mouth daily.    [provider]  cyclobenzaprine (FLEXERIL) 10 MG tablet Take 0.5-1 tablets (5-10 mg total) by mouth 3 (three) times daily as needed for muscle spasms. 11/13/16   Ivar Drape D, PA  meloxicam (MOBIC) 15 MG tablet Take 1 tablet (15 mg total) by mouth daily. 11/13/16   Ivar Drape D, PA  metFORMIN (GLUCOPHAGE-XR) 500 MG 24 hr tablet TAKE 1 TABLET (500 MG TOTAL) BY MOUTH 2 (TWO) TIMES DAILY. 12/15/16   Ivar Drape D, PA  Multiple Vitamin (MULTIVITAMIN)  tablet Take 1 tablet by mouth daily.    [provider]  naproxen (EC NAPROSYN) 500 MG EC tablet Take 1 tablet (500 mg total) by mouth 2 (two) times daily with a meal. Patient not taking: Reported on 07/04/2016 08/09/15   Tereasa Coop, PA-C  TERBINAFINE HCL PO Take by mouth daily.    [provider]  traMADol (ULTRAM) 50 MG tablet Take 1-2 tablets (50-100 mg total) by mouth every 8 (eight) hours as needed for moderate pain. Patient not taking: Reported on 11/13/2016 03/05/16   Alveda Reasons, MD  UNABLE TO FIND [place]    [provider]   Social History   Socioeconomic History  . Marital status: Married    Spouse name: Not on file  . Number of children: Not on file    . Years of education: Not on file  . Highest education level: Not on file  Social Needs  . Financial resource strain: Not on file  . Food insecurity - worry: Not on file  . Food insecurity - inability: Not on file  . Transportation needs - medical: Not on file  . Transportation needs - non-medical: Not on file  Occupational History  . Occupation: Surveyor, quantity: [place]  Tobacco Use  . Smoking status: Never Smoker  . Smokeless tobacco: Never Used  Substance and Sexual Activity  . Alcohol use: No  . Drug use: No  . Sexual activity: Not on file  Other Topics Concern  . Not on file  Social History Narrative   Exercise calisthenics    Review of Systems  Constitutional: Negative for chills and fever.  Eyes: Negative for pain and redness.  Respiratory: Negative for cough and shortness of breath.   Gastrointestinal: Negative for nausea and vomiting.  Musculoskeletal:       Hip pain  Neurological: Negative for syncope and speech difficulty.       Objective:   Physical Exam  Constitutional: He is oriented to person, place, and time. He appears well-developed and well-nourished. No distress.  HENT:  Head: Normocephalic and atraumatic.  Pulmonary/Chest: Effort normal. No respiratory distress.  Musculoskeletal:  Left: some what guarded on exam but pain free hip interna/ external rotation.  He does have some discomfort with full flexion of the hip but that discomfort is in the upper buttocks.  Trochanteric bursa is non tender. Tender along the left sciatic notch. Flexion to only approximately 80 degrees. Slight discomfort with left rotation.  Lateral flexion is pain free. Able to heel and toe walk without difficulty.   Neurological: He is alert and oriented to person, place, and time. He displays no Babinski's sign on the left side.  Reflex Scores:      Patellar reflexes are 1+ on the left side.      Achilles reflexes are 1+ on the left side. Skin: Skin is warm and dry.   Psychiatric: He has a normal mood and affect. His behavior is normal.   Vitals:   03/23/17 1001  Pulse: 78  Resp: 16  Temp: 98.5 F (36.9 C)  TempSrc: Oral  SpO2: 100%  Weight: 258 lb 6.4 oz (117.2 kg)  Height: 5' 10"  (1.778 m)    Dg Lumbar Spine Complete  Result Date: 03/23/2017 CLINICAL DATA:  Low back pain radiating into the left buttock and left hip for the past year with increased severity over the past several months. EXAM: LUMBAR SPINE - COMPLETE 4+ VIEW COMPARISON:  Right hip series of  today's date FINDINGS: There is gentle curvature convex toward the left centered at L3. There is 10 % posterior and 15% anterior compression of the body of L2. There is no spondylolisthesis. The disc space heights are well maintained. There is mild multilevel facet joint hypertrophy. IMPRESSION: Mild compression of the body of L2 of uncertain age. No spondylolisthesis. Multilevel degenerative facet joint change. Electronically Signed   By: David  Martinique M.D.   On: 03/23/2017 10:46   Dg Hip Unilat W Or W/o Pelvis 2-3 Views Left  Result Date: 03/23/2017 CLINICAL DATA:  56 year old male with low back and buttock pain extending into left hip for the past year worse over the past few months. No reported injury. Initial encounter. EXAM: DG HIP (WITH OR WITHOUT PELVIS) 2-3V LEFT COMPARISON:  No comparison hip films. Lumbosacral spine films performed same date are dictated separately. FINDINGS: Moderate left-sided and mild right-sided hip joint degenerative changes. No plain film evidence femoral head avascular necrosis. No fracture or dislocation. Mild left sacroiliac joint degenerative changes. Degenerative changes lower lumbar spine incompletely assessed. IMPRESSION: Moderate left-sided and mild right-sided hip joint degenerative changes. Mild left sacroiliac joint degenerative changes. Electronically Signed   By: Genia Del M.D.   On: 03/23/2017 10:47         Assessment & Plan:    Donald [name]  is a 56 y.o. male Left hip pain - Plan: DG HIP UNILAT W OR W/O PELVIS 2-3 VIEWS LEFT, meloxicam (MOBIC) 7.5 MG tablet, Ambulatory referral to Physical Therapy  Need for prophylactic vaccination and inoculation against influenza - Plan: Flu Vaccine QUAD 36+ mos IM  Left-sided low back pain without sciatica, unspecified chronicity - Plan: DG Lumbar Spine Complete, meloxicam (MOBIC) 7.5 MG tablet, Ambulatory referral to Physical Therapy  Suspect combination of lumbar and possible degenerative changes of hip contributing to pain. Prior likely compression fracture from fall last year, not having pain in that area.  - Change from Aleve to meloxicam short-term, cardiac risks discussed, ideally minimize duration of treatment.  - Refer to physical therapy.  - Tylenol in addition to loss scan as needed  - Consider orthopedic eval if not improving, recheck in the next few weeks. Sooner if worse.  Meds ordered this encounter  Medications  . meloxicam (MOBIC) 7.5 MG tablet    Sig: Take 1-2 tablets (7.5-15 mg total) daily by mouth.    Dispense:  30 tablet    Refill:  0   Patient Instructions   Your hip pain may be coming form your back. I did see a possible compression fracture of L2, but that may be from fall last year as not painful in that area. There was some arthritis in the hip as well. Physical therapy may be helpful for both. I will refer you.   Stop the Alleve, and try meloxicam one to two per day at the most. We will try to use this short term due to the possible cardiac risks we discussed.  Ok to take tylenol with meloxicam if needed, but no other over the counter pain relievers.   Recheck in next 2-3 weeks. Return to the clinic or go to the nearest emergency room if any of your symptoms worsen or new symptoms occur.  Back Pain, Adult Back pain is very common in adults.The cause of back pain is rarely dangerous and the pain often gets better over time.The cause of your back pain may not  be known. Some common causes of back pain include:  Strain  of the muscles or ligaments supporting the spine.  Wear and tear (degeneration) of the spinal disks.  Arthritis.  Direct injury to the back.  For many people, back pain may return. Since back pain is rarely dangerous, most people can learn to manage this condition on their own. Follow these instructions at home: Watch your back pain for any changes. The following actions may help to lessen any discomfort you are feeling:  Remain active. It is stressful on your back to sit or stand in one place for long periods of time. Do not sit, drive, or stand in one place for more than 30 minutes at a time. Take short walks on even surfaces as soon as you are able.Try to increase the length of time you walk each day.  Exercise regularly as directed by your health care provider. Exercise helps your back heal faster. It also helps avoid future injury by keeping your muscles strong and flexible.  Do not stay in bed.Resting more than 1-2 days can delay your recovery.  Pay attention to your body when you bend and lift. The most comfortable positions are those that put less stress on your recovering back. Always use proper lifting techniques, including: ? Bending your knees. ? Keeping the load close to your body. ? Avoiding twisting.  Find a comfortable position to sleep. Use a firm mattress and lie on your side with your knees slightly bent. If you lie on your back, put a pillow under your knees.  Avoid feeling anxious or stressed.Stress increases muscle tension and can worsen back pain.It is important to recognize when you are anxious or stressed and learn ways to manage it, such as with exercise.  Take medicines only as directed by your health care provider. Over-the-counter medicines to reduce pain and inflammation are often the most helpful.Your health care provider may prescribe muscle relaxant drugs.These medicines help dull your pain  so you can more quickly return to your normal activities and healthy exercise.  Apply ice to the injured area: ? Put ice in a plastic bag. ? Place a towel between your skin and the bag. ? Leave the ice on for 20 minutes, 2-3 times a day for the first 2-3 days. After that, ice and heat may be alternated to reduce pain and spasms.  Maintain a healthy weight. Excess weight puts extra stress on your back and makes it difficult to maintain good posture.  Contact a health care provider if:  You have pain that is not relieved with rest or medicine.  You have increasing pain going down into the legs or buttocks.  You have pain that does not improve in one week.  You have night pain.  You lose weight.  You have a fever or chills. Get help right away if:  You develop new bowel or bladder control problems.  You have unusual weakness or numbness in your arms or legs.  You develop nausea or vomiting.  You develop abdominal pain.  You feel faint. This information is not intended to replace advice given to you by your health care provider. Make sure you discuss any questions you have with your health care provider. Document Released: 04/28/2005 Document Revised: 09/06/2015 Document Reviewed: 08/30/2013 Elsevier Interactive Patient Education  2017 Elsevier Inc.  Hip Pain The hip is the joint between the upper legs and the lower pelvis. The bones, cartilage, tendons, and muscles of your hip joint support your body and allow you to move around. Hip pain can range from  a minor ache to severe pain in one or both of your hips. The pain may be felt on the inside of the hip joint near the groin, or the outside near the buttocks and upper thigh. You may also have swelling or stiffness. Follow these instructions at home: Managing pain, stiffness, and swelling  If directed, apply ice to the injured area. ? Put ice in a plastic bag. ? Place a towel between your skin and the bag. ? Leave the ice  on for 20 minutes, 2-3 times a day  Sleep with a pillow between your legs on your most comfortable side.  Avoid any activities that cause pain. General instructions  Take over-the-counter and prescription medicines only as told by your health care provider.  Do any exercises as told by your health care provider.  Record the following: ? How often you have hip pain. ? The location of your pain. ? What the pain feels like. ? What makes the pain worse.  Keep all follow-up visits as told by your health care provider. This is important. Contact a health care provider if:  You cannot put weight on your leg.  Your pain or swelling continues or gets worse after one week.  It gets harder to walk.  You have a fever. Get help right away if:  You fall.  You have a sudden increase in pain and swelling in your hip.  Your hip is red or swollen or very tender to touch. Summary  Hip pain can range from a minor ache to severe pain in one or both of your hips.  The pain may be felt on the inside of the hip joint near the groin, or the outside near the buttocks and upper thigh.  Avoid any activities that cause pain.  Record how often you have hip pain, the location of the pain, what makes it worse and what it feels like. This information is not intended to replace advice given to you by your health care provider. Make sure you discuss any questions you have with your health care provider. Document Released: 10/16/2009 Document Revised: 03/31/2016 Document Reviewed: 03/31/2016 Elsevier Interactive Patient Education  2017 [employer].    IF you received an x-ray today, you will receive an invoice from [facility]. Please contact [facility] at [phone] with questions or concerns regarding your invoice.   IF you received labwork today, you will receive an invoice from [place]. Please contact [facility] at [phone] with questions or concerns regarding your  invoice.   Our billing staff will not be able to assist you with questions regarding bills from these companies.  You will be contacted with the lab results as soon as they are available. The fastest way to get your results is to activate your My Chart account. Instructions are located on the last page of this paperwork. If you have not heard from [place] regarding the results in 2 weeks, please contact this office.      I personally performed the services described in this documentation, which was scribed in my presence. The recorded information has been reviewed and considered for accuracy and completeness, addended by me as needed, and agree with information above.  Signed,   Merri Ray, MD Primary Care at [facility].  03/23/17 11:12 AM

## 2017-04-03 ENCOUNTER — Other Ambulatory Visit: Payer: Self-pay | Admitting: Family Medicine

## 2017-04-03 DIAGNOSIS — M25552 Pain in left hip: Secondary | ICD-10-CM

## 2017-04-03 DIAGNOSIS — M545 Low back pain, unspecified: Secondary | ICD-10-CM

## 2017-04-21 DIAGNOSIS — M25652 Stiffness of left hip, not elsewhere classified: Secondary | ICD-10-CM | POA: Diagnosis not present

## 2017-04-21 DIAGNOSIS — M25552 Pain in left hip: Secondary | ICD-10-CM | POA: Diagnosis not present

## 2017-04-25 ENCOUNTER — Other Ambulatory Visit: Payer: Self-pay | Admitting: Family Medicine

## 2017-04-25 DIAGNOSIS — M545 Low back pain, unspecified: Secondary | ICD-10-CM

## 2017-04-25 DIAGNOSIS — M25552 Pain in left hip: Secondary | ICD-10-CM

## 2017-04-27 NOTE — Telephone Encounter (Signed)
Refilled for one more month, but message sent to patient to follow-up prior to that running out if he still requires daily.

## 2017-05-07 DIAGNOSIS — M25652 Stiffness of left hip, not elsewhere classified: Secondary | ICD-10-CM | POA: Diagnosis not present

## 2017-05-07 DIAGNOSIS — M25552 Pain in left hip: Secondary | ICD-10-CM | POA: Diagnosis not present

## 2017-05-13 ENCOUNTER — Other Ambulatory Visit: Payer: Self-pay | Admitting: Family Medicine

## 2017-05-13 DIAGNOSIS — M25552 Pain in left hip: Secondary | ICD-10-CM

## 2017-05-13 DIAGNOSIS — M545 Low back pain, unspecified: Secondary | ICD-10-CM

## 2017-05-14 NOTE — Telephone Encounter (Signed)
Refill for Mobic. Last office visit 03/23/17. Last note states to use Mobic short term. Please advise.

## 2017-06-01 ENCOUNTER — Encounter: Payer: Self-pay | Admitting: Family Medicine

## 2017-06-01 ENCOUNTER — Ambulatory Visit (INDEPENDENT_AMBULATORY_CARE_PROVIDER_SITE_OTHER): Payer: 59 | Admitting: Family Medicine

## 2017-06-01 ENCOUNTER — Other Ambulatory Visit: Payer: Self-pay

## 2017-06-01 VITALS — BP 124/84 | HR 72 | Temp 97.8°F | Resp 16 | Ht 69.0 in | Wt 256.6 lb

## 2017-06-01 DIAGNOSIS — Z113 Encounter for screening for infections with a predominantly sexual mode of transmission: Secondary | ICD-10-CM | POA: Diagnosis not present

## 2017-06-01 DIAGNOSIS — M545 Low back pain, unspecified: Secondary | ICD-10-CM

## 2017-06-01 DIAGNOSIS — E119 Type 2 diabetes mellitus without complications: Secondary | ICD-10-CM | POA: Diagnosis not present

## 2017-06-01 DIAGNOSIS — M62830 Muscle spasm of back: Secondary | ICD-10-CM

## 2017-06-01 DIAGNOSIS — Z5181 Encounter for therapeutic drug level monitoring: Secondary | ICD-10-CM

## 2017-06-01 MED ORDER — CYCLOBENZAPRINE HCL 10 MG PO TABS: 5.0000 mg | ORAL_TABLET | Freq: Three times a day (TID) | ORAL | 1 refills | Status: DC | PRN

## 2017-06-01 MED ORDER — TRAMADOL HCL 50 MG PO TABS: 50.0000 mg | ORAL_TABLET | Freq: Four times a day (QID) | ORAL | 0 refills | Status: DC | PRN

## 2017-06-01 MED ORDER — NAPROXEN 500 MG PO TBEC: 500.0000 mg | DELAYED_RELEASE_TABLET | Freq: Two times a day (BID) | ORAL | 0 refills | Status: DC

## 2017-06-01 MED ORDER — METFORMIN HCL ER 500 MG PO TB24: 1000.0000 mg | ORAL_TABLET | Freq: Every day | ORAL | 0 refills | Status: DC

## 2017-06-01 NOTE — Progress Notes (Signed)
Subjective:  This chart was scribed for [name], MD by Tamsen Roers, at [place] at [facility].  This patient was seen in room 2 and the patient's care was started at 11:28 AM.   Chief Complaint  Patient presents with  . Back Pain    lower x 4 days  . Medication Refill    METFORMIN     Patient ID: Donald [name], male    DOB: October 15, 1960, 57 y.o.   MRN: [id number]  HPI  HPI Comments: Donald [name] is a 57 y.o. male who presents to Primary Care at [facility] complaining of lower back pain onset four days ago. He denies any recent injury and woke up with pain in the morning.  He has tried applying heat to the area as well as some messaging which alleviated it very briefly but states that the pain came back.  He now describes it as a 8.5/10 pain. Patient has been taking Meloxicam (2 per day) which was given to him previously by Dr. Carlota Raspberry but this only "took the edge off". Denies weakness in legs, numbness/tingling, abnormal bladder/bowel movents.  Patient has a history of chronic radiating left sided back pain but states that his back pain currently feels different from this. Denies any history of stomach ulcers or indigestion.   Patient would also like a refill of his Metformin.  He denies any side effects from this medication and checks his blood sugars at home (range around 110).  He takes baby aspirin daily and will be going to see his eye doctor this month. Patient has lost weight since his last visit by cutting out breads, fast foods and sodas.    Past Medical History:  Diagnosis Date  . Anemia   . DM (diabetes mellitus) ([place])   . Hypertension   . Obesity   . Sleep apnea    on CPAP    Current Outpatient Medications on File Prior to Visit  Medication Sig Dispense Refill  . acetaminophen (TYLENOL) 500 MG tablet Take 1,000 mg by mouth every 6 (six) hours as needed.    Marland Kitchen aspirin 81 MG tablet Take 81 mg by mouth daily.      . cholecalciferol (VITAMIN D) 1000 UNITS tablet Take  1,000 Units by mouth daily.    . cyclobenzaprine (FLEXERIL) 10 MG tablet Take 0.5-1 tablets (5-10 mg total) by mouth 3 (three) times daily as needed for muscle spasms. 30 tablet 0  . meloxicam (MOBIC) 7.5 MG tablet TAKE 1-2 TABLETS (7.5-15 MG TOTAL) DAILY BY MOUTH. 30 tablet 0  . metFORMIN (GLUCOPHAGE-XR) 500 MG 24 hr tablet TAKE 1 TABLET (500 MG TOTAL) BY MOUTH 2 (TWO) TIMES DAILY. 60 tablet 5  . Multiple Vitamin (MULTIVITAMIN) tablet Take 1 tablet by mouth daily.    . naproxen (EC NAPROSYN) 500 MG EC tablet Take 1 tablet (500 mg total) by mouth 2 (two) times daily with a meal. 30 tablet 0  . traMADol (ULTRAM) 50 MG tablet Take 1-2 tablets (50-100 mg total) by mouth every 8 (eight) hours as needed for moderate pain. 30 tablet 0  . blood glucose meter kit and supplies KIT Dispense based on patient and insurance preference. Use up to four times daily as directed. (FOR ICD-9 250.00, 250.01). 1 each 0  . blood glucose meter kit and supplies Dispense based on patient and insurance preference. Use up to four times daily as directed. (FOR ICD-9 250.00, 250.01). 1 each 0  . TERBINAFINE HCL PO Take by  mouth daily.    Marland Kitchen UNABLE TO FIND [place]     No current facility-administered medications on file prior to visit.     No Known Allergies  Past Surgical History:  Procedure Laterality Date  . COLONOSCOPY  2009  . FOOT SURGERY     Family History  Problem Relation Age of Onset  . Diabetes Sister 31  . Colon cancer Neg Hx    Social History   Socioeconomic History  . Marital status: Married    Spouse name: None  . Number of children: None  . Years of education: None  . Highest education level: None  Social Needs  . Financial resource strain: None  . Food insecurity - worry: None  . Food insecurity - inability: None  . Transportation needs - medical: None  . Transportation needs - non-medical: None  Occupational History  . Occupation: Surveyor, quantity: [employer]  Tobacco Use  . Smoking  status: Never Smoker  . Smokeless tobacco: Never Used  Substance and Sexual Activity  . Alcohol use: No  . Drug use: No  . Sexual activity: None  Other Topics Concern  . None  Social History Narrative   Exercise calisthenics   Depression screen [facility] 2/9 06/01/2017 03/23/2017 11/13/2016 07/04/2016 05/12/2015  Decreased Interest 0 0 0 0 0  Down, Depressed, Hopeless 0 0 0 0 0  PHQ - 2 Score 0 0 0 0 0    Review of Systems  Constitutional: Negative for chills and fever.  Eyes: Negative for pain, redness and itching.  Gastrointestinal: Negative for constipation, diarrhea, nausea and vomiting.  Genitourinary: Negative for difficulty urinating and frequency.  Musculoskeletal: Positive for back pain.       Objective:   Physical Exam  Constitutional: He is oriented to person, place, and time. He appears well-developed and well-nourished. No distress.  HENT:  Head: Normocephalic and atraumatic.  Eyes: Conjunctivae are normal. Pupils are equal, round, and reactive to light. No scleral icterus.  Neck: Normal range of motion. Neck supple. No thyromegaly present.  Cardiovascular: Normal rate, regular rhythm, normal heart sounds and intact distal pulses.  Pulmonary/Chest: Effort normal and breath sounds normal. No respiratory distress.  Musculoskeletal: He exhibits no edema.  Palpable muscle spasms at the top of the left sacrum paraspinal muscles, no rash or skin changes. No lumbar spinal process tenderness to palpation , no SI joint tenderness.   Lymphadenopathy:    He has no cervical adenopathy.  Neurological: He is alert and oriented to person, place, and time.  Reflex Scores:      Patellar reflexes are 1+ on the right side and 1+ on the left side.      Achilles reflexes are 1+ on the right side and 1+ on the left side. Skin: Skin is warm and dry. He is not diaphoretic.  Psychiatric: He has a normal mood and affect. His behavior is normal.   Diabetic Foot Exam - Simple   Simple Foot  Form Diabetic Foot exam was performed with the following findings:  Yes 06/01/2017 11:48 AM  Visual Inspection No deformities, no ulcerations, no other skin breakdown bilaterally:  Yes Sensation Testing Intact to touch and monofilament testing bilaterally:  Yes Pulse Check Posterior Tibialis and Dorsalis pulse intact bilaterally:  Yes Comments     Vitals:   06/01/17 1120 06/01/17 1125  BP: (!) 156/92 124/84  Pulse: 72   Resp: 16   Temp: 97.8 F (36.6 C)   TempSrc: Oral   SpO2:  100%   Weight: 256 lb 9.6 oz (116.4 kg)   Height: 5' 9"  (1.753 m)        Assessment & Plan:   1. Left-sided low back pain without sciatica, unspecified chronicity   2. Type 2 diabetes mellitus without complication, without long-term current use of insulin (HCC) - if A1C is at goal - will send in years supply of metformin. Out tomorrow so 3 mo refill sent in for now while labs P  3. Severe obesity (BMI >= 40) (HCC)   4. Medication monitoring encounter   5. Routine screening for STI (sexually transmitted infection)   6. Midline low back pain without sciatica   7. Acute midline low back pain without sciatica   8. Muscle spasm of back - start nsaid, heat, strethcing, no lifitng, prn flexeril with tramadol for breakthrough pain. Restart PT - pt already working with PT for Lt hip so declines referral as he can just call and sched    Orders Placed This Encounter  Procedures  . Comprehensive metabolic panel  . Hemoglobin A1c  . Microalbumin/Creatinine Ratio, Urine  . Lipid panel    Order Specific Question:   Has the patient fasted?    Answer:   Yes  . HIV antibody  . HCV Ab w/Rflx to Verification  . HM DIABETES FOOT EXAM    Meds ordered this encounter  Medications  . naproxen (EC NAPROSYN) 500 MG EC tablet    Sig: Take 1 tablet (500 mg total) by mouth 2 (two) times daily with a meal.    Dispense:  60 tablet    Refill:  0  . traMADol (ULTRAM) 50 MG tablet    Sig: Take 1-2 tablets (50-100 mg  total) by mouth every 6 (six) hours as needed for moderate pain.    Dispense:  30 tablet    Refill:  0  . cyclobenzaprine (FLEXERIL) 10 MG tablet    Sig: Take 0.5-1 tablets (5-10 mg total) by mouth 3 (three) times daily as needed for muscle spasms.    Dispense:  30 tablet    Refill:  1  . metFORMIN (GLUCOPHAGE-XR) 500 MG 24 hr tablet    Sig: Take 2 tablets (1,000 mg total) by mouth daily with breakfast.    Dispense:  180 tablet    Refill:  0    I personally performed the services described in this documentation, which was scribed in my presence. The recorded information has been reviewed and considered, and addended by me as needed.   [name], M.D.  Primary Care at [facility] [address] [place], [place] [address] [phone] phone [phone] fax  06/01/17 2:39 PM

## 2017-06-01 NOTE — Patient Instructions (Addendum)
I recommend keeping naproxen on board - esp taking before and during work. Then when you come home, take a muscle relaxant (cyclobenzaprine) followed by 15 minutes of heat followed by gentle stretching. Try to do this regiment with heat and stretching 2 - 3 times a day.  If you are still having pain in 4 to 6 wks, come back to clinic for further eval. RTC immed if symptoms worsen or you develop any other concerning symptoms below.   IF you received an x-ray today, you will receive an invoice from [facility]. Please contact [facility] at [phone] with questions or concerns regarding your invoice.   IF you received labwork today, you will receive an invoice from [place]. Please contact [facility] at [phone] with questions or concerns regarding your invoice.   Our billing staff will not be able to assist you with questions regarding bills from these companies.  You will be contacted with the lab results as soon as they are available. The fastest way to get your results is to activate your My Chart account. Instructions are located on the last page of this paperwork. If you have not heard from [place] regarding the results in 2 weeks, please contact this office.     Muscle Cramps and Spasms Muscle cramps and spasms occur when a muscle or muscles tighten and you have no control over this tightening (involuntary muscle contraction). They are a common problem and can develop in any muscle. The most common place is in the calf muscles of the leg. Muscle cramps and muscle spasms are both involuntary muscle contractions, but there are some differences between the two:  Muscle cramps are painful. They come and go and may last a few seconds to 15 minutes. Muscle cramps are often more forceful and last longer than muscle spasms.  Muscle spasms may or may not be painful. They may also last just a few seconds or much longer.  Certain medical conditions, such as diabetes or Parkinson  disease, can make it more likely to develop cramps or spasms. However, cramps or spasms are usually not caused by a serious underlying problem. Common causes include:  Overexertion.  Overuse from repetitive motions, or doing the same thing over and over.  Remaining in a certain position for a long period of time.  Improper preparation, form, or technique while playing a sport or doing an activity.  Dehydration.  Injury.  Side effects of some medicines.  Abnormally low levels of the salts and ions in your blood (electrolytes), especially potassium and calcium. This could happen if you are taking water pills (diuretics) or if you are pregnant.  In many cases, the cause of muscle cramps or spasms is unknown. Follow these instructions at home:  Stay well hydrated. Drink enough fluid to keep your urine clear or pale yellow.  Try massaging, stretching, and relaxing the affected muscle.  If directed, apply heat to tight or tense muscles as often as told by your health care provider. Use the heat source that your health care provider recommends, such as a moist heat pack or a heating pad. ? Place a towel between your skin and the heat source. ? Leave the heat on for 20-30 minutes. ? Remove the heat if your skin turns bright red. This is especially important if you are unable to feel pain, heat, or cold. You may have a greater risk of getting burned.  If directed, put ice on the affected area. This may help if you are sore  or have pain after a cramp or spasm. ? Put ice in a plastic bag. ? Place a towel between your skin and the bag. ? Leavethe ice on for 20 minutes, 2-3 times a day.  Take over-the-counter and prescription medicines only as told by your health care provider.  Pay attention to any changes in your symptoms. Contact a health care provider if:  Your cramps or spasms get more severe or happen more often.  Your cramps or spasms do not improve over time. This information  is not intended to replace advice given to you by your health care provider. Make sure you discuss any questions you have with your health care provider. Document Released: 10/18/2001 Document Revised: 05/30/2015 Document Reviewed: 01/30/2015 Elsevier Interactive Patient Education  2018 [place] Injury Prevention Back injuries can be very painful. They can also be difficult to heal. After having one back injury, you are more likely to injure your back again. It is important to learn how to avoid injuring or re-injuring your back. The following tips can help you to prevent a back injury. What should I know about physical fitness?  Exercise for 30 minutes per day on most days of the week or as directed by your health care provider. Make sure to: ? Do aerobic exercises, such as walking, jogging, biking, or swimming. ? Do exercises that increase balance and strength, such as tai chi and yoga. These can decrease your risk of falling and injuring your back. ? Do stretching exercises to help with flexibility. ? Try to develop strong abdominal muscles. Your abdominal muscles provide a lot of the support that is needed by your back.  Maintain a healthy weight. This helps to decrease your risk of a back injury. What should I know about my diet?  Talk with your health care provider about your overall diet. Take supplements and vitamins only as directed by your health care provider.  Talk with your health care provider about how much calcium and vitamin D you need each day. These nutrients help to prevent weakening of the bones (osteoporosis). Osteoporosis can cause broken (fractured) bones, which lead to back pain.  Include good sources of calcium in your diet, such as dairy products, green leafy vegetables, and products that have had calcium added to them (fortified).  Include good sources of vitamin D in your diet, such as milk and foods that are fortified with vitamin D. What should I  know about my posture?  Sit up straight and stand up straight. Avoid leaning forward when you sit or hunching over when you stand.  Choose chairs that have good low-back (lumbar) support.  If you work at a desk, sit close to it so you do not need to lean over. Keep your chin tucked in. Keep your neck drawn back, and keep your elbows bent at a right angle. Your arms should look like the letter "L."  Sit high and close to the steering wheel when you drive. Add a lumbar support to your car seat, if needed.  Avoid sitting or standing in one position for very long. Take breaks to get up, stretch, and walk around at least one time every hour. Take breaks every hour if you are driving for long periods of time.  Sleep on your side with your knees slightly bent, or sleep on your back with a pillow under your knees. Do not lie on the front of your body to sleep. What should I know about lifting, twisting,  and reaching? Lifting and Heavy Lifting   Avoid heavy lifting, especially repetitive heavy lifting. If you must do heavy lifting: ? Stretch before lifting. ? Work slowly. ? Rest between lifts. ? Use a tool such as a cart or a dolly to move objects if one is available. ? Make several small trips instead of carrying one heavy load. ? Ask for help when you need it, especially when moving big objects.  Follow these steps when lifting: ? Stand with your feet shoulder-width apart. ? Get as close to the object as you can. Do not try to pick up a heavy object that is far from your body. ? Use handles or lifting straps if they are available. ? Bend at your knees. Squat down, but keep your heels off the floor. ? Keep your shoulders pulled back, your chin tucked in, and your back straight. ? Lift the object slowly while you tighten the muscles in your legs, abdomen, and buttocks. Keep the object as close to the center of your body as possible.  Follow these steps when putting down a heavy load: ? Stand  with your feet shoulder-width apart. ? Lower the object slowly while you tighten the muscles in your legs, abdomen, and buttocks. Keep the object as close to the center of your body as possible. ? Keep your shoulders pulled back, your chin tucked in, and your back straight. ? Bend at your knees. Squat down, but keep your heels off the floor. ? Use handles or lifting straps if they are available. Twisting and Reaching  Avoid lifting heavy objects above your waist.  Do not twist at your waist while you are lifting or carrying a load. If you need to turn, move your feet.  Do not bend over without bending at your knees.  Avoid reaching over your head, across a table, or for an object on a high surface. What are some other tips?  Avoid wet floors and icy ground. Keep sidewalks clear of ice to prevent falls.  Do not sleep on a mattress that is too soft or too hard.  Keep items that are used frequently within easy reach.  Put heavier objects on shelves at waist level, and put lighter objects on lower or higher shelves.  Find ways to decrease your stress, such as exercise, massage, or relaxation techniques. Stress can build up in your muscles. Tense muscles are more vulnerable to injury.  Talk with your health care provider if you feel anxious or depressed. These conditions can make back pain worse.  Wear flat heel shoes with cushioned soles.  Avoid sudden movements.  Use both shoulder straps when carrying a backpack.  Do not use any tobacco products, including cigarettes, chewing tobacco, or electronic cigarettes. If you need help quitting, ask your health care provider. This information is not intended to replace advice given to you by your health care provider. Make sure you discuss any questions you have with your health care provider. Document Released: 06/05/2004 Document Revised: 10/04/2015 Document Reviewed: 05/02/2014 Elsevier Interactive Patient Education  2018 [place] Strain Rehab Ask your health care provider which exercises are safe for you. Do exercises exactly as told by your health care provider and adjust them as directed. It is normal to feel mild stretching, pulling, tightness, or discomfort as you do these exercises, but you should stop right away if you feel sudden pain or your pain gets worse. Do not begin these exercises until told by your health  care provider. Stretching and range of motion exercises These exercises warm up your muscles and joints and improve the movement and flexibility of your back. These exercises also help to relieve pain, numbness, and tingling. Exercise A: Single knee to chest  1. Lie on your back on a firm surface with both legs straight. 2. Bend one of your knees. Use your hands to move your knee up toward your chest until you feel a gentle stretch in your lower back and buttock. ? Hold your leg in this position by holding onto the front of your knee. ? Keep your other leg as straight as possible. 3. Hold for __________ seconds. 4. Slowly return to the starting position. 5. Repeat with your other leg. Repeat __________ times. Complete this exercise __________ times a day. Exercise B: Prone extension on elbows  1. Lie on your abdomen on a firm surface. 2. Prop yourself up on your elbows. 3. Use your arms to help lift your chest up until you feel a gentle stretch in your abdomen and your lower back. ? This will place some of your body weight on your elbows. If this is uncomfortable, try stacking pillows under your chest. ? Your hips should stay down, against the surface that you are lying on. Keep your hip and back muscles relaxed. 4. Hold for __________ seconds. 5. Slowly relax your upper body and return to the starting position. Repeat __________ times. Complete this exercise __________ times a day. Strengthening exercises These exercises build strength and endurance in your back. Endurance is the  ability to use your muscles for a long time, even after they get tired. Exercise C: Pelvic tilt 1. Lie on your back on a firm surface. Bend your knees and keep your feet flat. 2. Tense your abdominal muscles. Tip your pelvis up toward the ceiling and flatten your lower back into the floor. ? To help with this exercise, you may place a small towel under your lower back and try to push your back into the towel. 3. Hold for __________ seconds. 4. Let your muscles relax completely before you repeat this exercise. Repeat __________ times. Complete this exercise __________ times a day. Exercise D: Alternating arm and leg raises  1. Get on your hands and knees on a firm surface. If you are on a hard floor, you may want to use padding to cushion your knees, such as an exercise mat. 2. Line up your arms and legs. Your hands should be below your shoulders, and your knees should be below your hips. 3. Lift your left leg behind you. At the same time, raise your right arm and straighten it in front of you. ? Do not lift your leg higher than your hip. ? Do not lift your arm higher than your shoulder. ? Keep your abdominal and back muscles tight. ? Keep your hips facing the ground. ? Do not arch your back. ? Keep your balance carefully, and do not hold your breath. 4. Hold for __________ seconds. 5. Slowly return to the starting position and repeat with your right leg and your left arm. Repeat __________ times. Complete this exercise __________times a day. Exercise J: Single leg lower with bent knees 1. Lie on your back on a firm surface. 2. Tense your abdominal muscles and lift your feet off the floor, one foot at a time, so your knees and hips are bent in an "L" shape (at about 90 degrees). ? Your knees should be over your hips and your  lower legs should be parallel to the floor. 3. Keeping your abdominal muscles tense and your knee bent, slowly lower one of your legs so your toe touches the  ground. 4. Lift your leg back up to return to the starting position. ? Do not hold your breath. ? Do not let your back arch. Keep your back flat against the ground. 5. Repeat with your other leg. Repeat __________ times. Complete this exercise __________ times a day. Posture and body mechanics  Body mechanics refers to the movements and positions of your body while you do your daily activities. Posture is part of body mechanics. Good posture and healthy body mechanics can help to relieve stress in your body's tissues and joints. Good posture means that your spine is in its natural S-curve position (your spine is neutral), your shoulders are pulled back slightly, and your head is not tipped forward. The following are general guidelines for applying improved posture and body mechanics to your everyday activities. Standing   When standing, keep your spine neutral and your feet about hip-width apart. Keep a slight bend in your knees. Your ears, shoulders, and hips should line up.  When you do a task in which you stand in one place for a long time, place one foot up on a stable object that is 2-4 inches (5-10 cm) high, such as a footstool. This helps keep your spine neutral. Sitting   When sitting, keep your spine neutral and keep your feet flat on the floor. Use a footrest, if necessary, and keep your thighs parallel to the floor. Avoid rounding your shoulders, and avoid tilting your head forward.  When working at a desk or a computer, keep your desk at a height where your hands are slightly lower than your elbows. Slide your chair under your desk so you are close enough to maintain good posture.  When working at a computer, place your monitor at a height where you are looking straight ahead and you do not have to tilt your head forward or downward to look at the screen. Resting   When lying down and resting, avoid positions that are most painful for you.  If you have pain with activities  such as sitting, bending, stooping, or squatting (flexion-based activities), lie in a position in which your body does not bend very much. For example, avoid curling up on your side with your arms and knees near your chest (fetal position).  If you have pain with activities such as standing for a long time or reaching with your arms (extension-based activities), lie with your spine in a neutral position and bend your knees slightly. Try the following positions: ? Lying on your side with a pillow between your knees. ? Lying on your back with a pillow under your knees. Lifting   When lifting objects, keep your feet at least shoulder-width apart and tighten your abdominal muscles.  Bend your knees and hips and keep your spine neutral. It is important to lift using the strength of your legs, not your back. Do not lock your knees straight out.  Always ask for help to lift heavy or awkward objects. This information is not intended to replace advice given to you by your health care provider. Make sure you discuss any questions you have with your health care provider. Document Released: 04/28/2005 Document Revised: 01/03/2016 Document Reviewed: 02/07/2015 Elsevier Interactive Patient Education  [employer].

## 2017-06-02 LAB — COMPREHENSIVE METABOLIC PANEL
ALK PHOS: 56 IU/L (ref 39–117)
ALT: 15 IU/L (ref 0–44)
AST: 15 IU/L (ref 0–40)
Albumin/Globulin Ratio: 1.2 (ref 1.2–2.2)
Albumin: 4.3 g/dL (ref 3.5–5.5)
BILIRUBIN TOTAL: 0.5 mg/dL (ref 0.0–1.2)
BUN/Creatinine Ratio: 22 — ABNORMAL HIGH (ref 9–20)
BUN: 14 mg/dL (ref 6–24)
CHLORIDE: 104 mmol/L (ref 96–106)
CO2: 21 mmol/L (ref 20–29)
CREATININE: 0.64 mg/dL — AB (ref 0.76–1.27)
Calcium: 9.2 mg/dL (ref 8.7–10.2)
GFR calc Af Amer: 125 mL/min/{1.73_m2} (ref >59)
GFR calc non Af Amer: 108 mL/min/{1.73_m2} (ref >59)
GLUCOSE: 99 mg/dL (ref 65–99)
Globulin, Total: 3.5 g/dL (ref 1.5–4.5)
Potassium: 4.6 mmol/L (ref 3.5–5.2)
Sodium: 140 mmol/L (ref 134–144)
TOTAL PROTEIN: 7.8 g/dL (ref 6.0–8.5)

## 2017-06-02 LAB — LIPID PANEL
CHOL/HDL RATIO: 3.4 ratio (ref 0.0–5.0)
CHOLESTEROL TOTAL: 129 mg/dL (ref 100–199)
HDL: 38 mg/dL — ABNORMAL LOW (ref >39)
LDL CALC: 81 mg/dL (ref 0–99)
Triglycerides: 51 mg/dL (ref 0–149)
VLDL CHOLESTEROL CAL: 10 mg/dL (ref 5–40)

## 2017-06-02 LAB — MICROALBUMIN / CREATININE URINE RATIO
Creatinine, Urine: 79.6 mg/dL
MICROALB/CREAT RATIO: 4.8 mg/g{creat} (ref 0.0–30.0)
MICROALBUM., U, RANDOM: 3.8 ug/mL

## 2017-06-02 LAB — HEMOGLOBIN A1C
Est. average glucose Bld gHb Est-mCnc: 128 mg/dL
Hgb A1c MFr Bld: 6.1 % — ABNORMAL HIGH (ref 4.8–5.6)

## 2017-06-02 LAB — HCV INTERPRETATION

## 2017-06-02 LAB — HIV ANTIBODY (ROUTINE TESTING W REFLEX): HIV SCREEN 4TH GENERATION: NONREACTIVE

## 2017-06-02 LAB — HCV AB W/RFLX TO VERIFICATION: HCV Ab: <0.1 s/co ratio (ref 0.0–0.9)

## 2017-06-16 ENCOUNTER — Telehealth: Payer: Self-pay

## 2017-06-16 DIAGNOSIS — M545 Low back pain, unspecified: Secondary | ICD-10-CM

## 2017-06-16 NOTE — Telephone Encounter (Signed)
[facility] faxed request for a change in Rx you sent for Naproxen DR 500 mg, its discontinued. Please consider changing to Naproxen immediate release and send in [place] Rx. Thanks

## 2017-06-18 MED ORDER — NAPROXEN 500 MG PO TABS: 500.0000 mg | ORAL_TABLET | Freq: Two times a day (BID) | ORAL | 0 refills | Status: DC

## 2017-06-18 NOTE — Telephone Encounter (Signed)
I had prescribed just the "enteric coated" naproxen so its not as hard on the stomach. It was not "DR" or delayed release.  This is going to be the same as 2 otc generic aleve twice a day.  Resent the regular naproxen again.

## 2017-06-18 NOTE — Addendum Note (Signed)
Addended by: [name] on: 06/18/2017 02:09 PM   Modules accepted: Orders

## 2017-07-26 ENCOUNTER — Encounter (HOSPITAL_COMMUNITY): Payer: Self-pay | Admitting: Emergency Medicine

## 2017-07-26 ENCOUNTER — Emergency Department (HOSPITAL_COMMUNITY)
Admission: EM | Admit: 2017-07-26 | Discharge: 2017-07-27 | Disposition: A | Discharge status: Home / Self Care | Payer: 59 | Attending: Emergency Medicine | Admitting: Emergency Medicine

## 2017-07-26 ENCOUNTER — Other Ambulatory Visit: Payer: Self-pay

## 2017-07-26 DIAGNOSIS — S4991XA Unspecified injury of right shoulder and upper arm, initial encounter: Secondary | ICD-10-CM | POA: Diagnosis present

## 2017-07-26 DIAGNOSIS — E119 Type 2 diabetes mellitus without complications: Secondary | ICD-10-CM | POA: Insufficient documentation

## 2017-07-26 DIAGNOSIS — Z79899 Other long term (current) drug therapy: Secondary | ICD-10-CM | POA: Insufficient documentation

## 2017-07-26 DIAGNOSIS — Z7982 Long term (current) use of aspirin: Secondary | ICD-10-CM | POA: Diagnosis not present

## 2017-07-26 DIAGNOSIS — Y93G3 Activity, cooking and baking: Secondary | ICD-10-CM | POA: Diagnosis not present

## 2017-07-26 DIAGNOSIS — M25511 Pain in right shoulder: Secondary | ICD-10-CM | POA: Insufficient documentation

## 2017-07-26 DIAGNOSIS — X503XXA Overexertion from repetitive movements, initial encounter: Secondary | ICD-10-CM | POA: Insufficient documentation

## 2017-07-26 DIAGNOSIS — Y9289 Other specified places as the place of occurrence of the external cause: Secondary | ICD-10-CM | POA: Insufficient documentation

## 2017-07-26 DIAGNOSIS — Z7984 Long term (current) use of oral hypoglycemic drugs: Secondary | ICD-10-CM | POA: Diagnosis not present

## 2017-07-26 DIAGNOSIS — I10 Essential (primary) hypertension: Secondary | ICD-10-CM | POA: Insufficient documentation

## 2017-07-26 DIAGNOSIS — Y990 Civilian activity done for income or pay: Secondary | ICD-10-CM | POA: Insufficient documentation

## 2017-07-26 NOTE — ED Triage Notes (Signed)
Patient complaining of right shoulder pain. Patient states he was stirring some food at work and heard his shoulder pop.

## 2017-07-27 ENCOUNTER — Other Ambulatory Visit (HOSPITAL_COMMUNITY): Payer: Managed Care, Other (non HMO)

## 2017-07-27 ENCOUNTER — Emergency Department (HOSPITAL_COMMUNITY): Payer: 59

## 2017-07-27 ENCOUNTER — Inpatient Hospital Stay (HOSPITAL_COMMUNITY): Admit: 2017-07-27 | Payer: Managed Care, Other (non HMO)

## 2017-07-27 DIAGNOSIS — M25511 Pain in right shoulder: Secondary | ICD-10-CM | POA: Diagnosis not present

## 2017-07-27 MED ORDER — IBUPROFEN 800 MG PO TABS: 800.0000 mg | ORAL_TABLET | Freq: Three times a day (TID) | ORAL | 0 refills | Status: DC

## 2017-07-27 NOTE — Discharge Instructions (Signed)
Take the prescribed medication as directed. Follow-up with orthopedics if any ongoing issues-- can call office for appt. Return to the ED for new or worsening symptoms.

## 2017-07-27 NOTE — ED Notes (Signed)
Patient complaining of right shoulder pain.

## 2017-07-27 NOTE — ED Provider Notes (Signed)
[facility] DEPT Provider Note   CSN: [id number] Arrival date & time: 07/26/17  2257     History   Chief Complaint Chief Complaint  Patient presents with  . Shoulder Injury    HPI RITA PROM is a 57 y.o. male.  The history is provided by the patient and medical records.  Shoulder Injury     57 year old male with history of anemia, diabetes, hypertension, obesity, sleep apnea, presenting to the ED for right shoulder pain.  Patient states he was stirring a pot of dumplings at work and felt a "pop" in his shoulder.  States since then he has not been able to move his shoulder all that well.  He did have a prior injury to this shoulder where he fell and broke it several years ago, no surgery required.  He is right hand dominant.  Denies numbness/weakness of right arm.  Has applied ice PTA.  Past Medical History:  Diagnosis Date  . Anemia   . DM (diabetes mellitus) ([place])   . Hypertension   . Obesity   . Sleep apnea    on CPAP    Patient Active Problem List   Diagnosis Date Noted  . Severe obesity (BMI >= 40) ([place]) 04/09/2013  . Type 2 diabetes mellitus ([place]) 04/01/2013  . Anemia, unspecified 04/01/2013  . Iron deficiency anemia, unspecified 11/21/2010    Past Surgical History:  Procedure Laterality Date  . COLONOSCOPY  2009  . FOOT SURGERY         Home Medications    Prior to Admission medications   Medication Sig Start Date End Date Taking? Authorizing Provider  acetaminophen (TYLENOL) 500 MG tablet Take 1,000 mg by mouth every 6 (six) hours as needed.    [provider]  aspirin 81 MG tablet Take 81 mg by mouth daily.      [provider]  blood glucose meter kit and supplies KIT Dispense based on patient and insurance preference. Use up to four times daily as directed. (FOR ICD-9 250.00, 250.01). 03/28/16   Alveda Reasons, MD  blood glucose meter kit and supplies Dispense based on patient and insurance  preference. Use up to four times daily as directed. (FOR ICD-9 250.00, 250.01). 03/28/16   [name], MD  cholecalciferol (VITAMIN D) 1000 UNITS tablet Take 1,000 Units by mouth daily.    [provider]  cyclobenzaprine (FLEXERIL) 10 MG tablet Take 0.5-1 tablets (5-10 mg total) by mouth 3 (three) times daily as needed for muscle spasms. 06/01/17   [name], MD  metFORMIN (GLUCOPHAGE-XR) 500 MG 24 hr tablet Take 2 tablets (1,000 mg total) by mouth daily with breakfast. 06/01/17   [name], MD  Multiple Vitamin (MULTIVITAMIN) tablet Take 1 tablet by mouth daily.    [provider]  naproxen (NAPROSYN) 500 MG tablet Take 1 tablet (500 mg total) by mouth 2 (two) times daily with a meal. 06/18/17   [name], MD  TERBINAFINE HCL PO Take by mouth daily.    [provider]  traMADol (ULTRAM) 50 MG tablet Take 1-2 tablets (50-100 mg total) by mouth every 6 (six) hours as needed for moderate pain. 06/01/17   [name], MD  UNABLE TO FIND [place]    [provider]    Family History Family History  Problem Relation Age of Onset  . Diabetes Sister 67  . Colon cancer Neg Hx     Social History Social History  Tobacco Use  . Smoking status: Never Smoker  . Smokeless tobacco: Never Used  Substance Use Topics  . Alcohol use: No  . Drug use: No     Allergies   Patient has no known allergies.   Review of Systems Review of Systems  Musculoskeletal: Positive for arthralgias.  All other systems reviewed and are negative.    Physical Exam Updated Vital Signs BP 138/86 (BP Location: Left Arm)   Pulse 85   Temp 97.9 F (36.6 C) (Oral)   Resp 16   Ht 5' 10"  (1.778 m)   Wt 111.1 kg (245 lb)   SpO2 97%   BMI 35.15 kg/m   Physical Exam  Constitutional: He is oriented to person, place, and time. He appears well-developed and well-nourished.  HENT:  Head: Normocephalic and atraumatic.  Mouth/Throat: Oropharynx is clear and moist.  Eyes:  Conjunctivae and EOM are normal. Pupils are equal, round, and reactive to light.  Neck: Normal range of motion.  Cardiovascular: Normal rate, regular rhythm and normal heart sounds.  Pulmonary/Chest: Effort normal and breath sounds normal. No stridor. No respiratory distress.  Abdominal: Soft. Bowel sounds are normal.  Musculoskeletal: Normal range of motion.  Right shoulder without gross deformity, pain with attempted range of motion of the shoulder- particularly abduction; no pain with range of motion of the elbow, wrist, or fingers; normal grip strength, moving all fingers normally, normal distal sensation and perfusion  Neurological: He is alert and oriented to person, place, and time.  Skin: Skin is warm and dry.  Psychiatric: He has a normal mood and affect.  Nursing note and vitals reviewed.    ED Treatments / Results  Labs (all labs ordered are listed, but only abnormal results are displayed) Labs Reviewed - No data to display  EKG  EKG Interpretation None       Radiology Dg Shoulder Right  Result Date: 07/27/2017 CLINICAL DATA:  Right shoulder pain. EXAM: RIGHT SHOULDER - 2+ VIEW COMPARISON:  03/05/2016 FINDINGS: Prominent undersurface spurring off the distal clavicle at the [facility] joint, new since prior exam. Osteoarthritic joint space narrowing is seen about the glenohumeral joint. Healed fracture deformity of previously noted greater tuberosity fracture. No acute fracture joint dislocation. The adjacent ribs and lung are nonacute. IMPRESSION: 1. Progression of AC joint osteoarthritis since prior with more prominent undersurface spurring off the distal clavicle since prior. Redemonstration of mild glenohumeral joint osteoarthritis. 2. No acute fracture joint dislocation. 3. Healed fracture deformity of the greater tuberosity. Electronically Signed   By: Ashley Royalty M.D.   On: 07/27/2017 02:33    Procedures Procedures (including critical care time)  Medications Ordered in  ED Medications - No data to display   Initial Impression / Assessment and Plan / ED Course  I have reviewed the triage vital signs and the nursing notes.  Pertinent labs & imaging results that were available during my care of the patient were reviewed by me and considered in my medical decision making (see chart for details).  57 year old male who with right shoulder pain.  Was stirring a pot of dumplings when he felt a "pop" in his right shoulder.  There is no gross deformity on exam.  Arm is neurovascularly intact.  Does have old injuries of the right shoulder.  X-ray obtained, progression of AC joint osteoarthritis as well as healed fracture of the greater tuberosity of the humerus, however no acute fracture or evidence of dislocation today.  Patient placed in shoulder sling for comfort.  Will start anti-inflammatories.  Can follow-up with orthopedics if any ongoing issues.  Discussed plan with patient, he acknowledged understanding and agreed with plan of care.  Return precautions given for new or worsening symptoms.  Final Clinical Impressions(s) / ED Diagnoses   Final diagnoses:  Acute pain of right shoulder    ED Discharge Orders        Ordered    ibuprofen (ADVIL,MOTRIN) 800 MG tablet  3 times daily     07/27/17 0249       Larene Pickett, PA-C 07/27/17 1674    Ripley Fraise, MD 07/27/17 [phone]

## 2017-08-19 ENCOUNTER — Encounter: Payer: Self-pay | Admitting: Physician Assistant

## 2017-11-30 ENCOUNTER — Encounter: Payer: 59 | Admitting: Family Medicine

## 2017-12-18 ENCOUNTER — Encounter: Payer: 59 | Admitting: Family Medicine

## 2017-12-18 ENCOUNTER — Telehealth: Payer: Self-pay | Admitting: Family Medicine

## 2017-12-18 NOTE — Telephone Encounter (Signed)
Called pt to reschedule their appt 02/08/18. Rescheduled  °

## 2018-01-20 ENCOUNTER — Telehealth: Payer: Self-pay | Admitting: Family Medicine

## 2018-02-08 ENCOUNTER — Encounter: Payer: 59 | Admitting: Family Medicine

## 2018-02-10 ENCOUNTER — Other Ambulatory Visit: Payer: Self-pay

## 2018-02-10 ENCOUNTER — Encounter: Payer: 59 | Admitting: Family Medicine

## 2018-02-10 ENCOUNTER — Encounter: Payer: Self-pay | Admitting: Emergency Medicine

## 2018-02-10 ENCOUNTER — Ambulatory Visit (INDEPENDENT_AMBULATORY_CARE_PROVIDER_SITE_OTHER): Payer: 59 | Admitting: Emergency Medicine

## 2018-02-10 VITALS — BP 134/77 | HR 81 | Temp 98.2°F | Resp 16 | Ht 68.25 in | Wt 265.4 lb

## 2018-02-10 DIAGNOSIS — M25552 Pain in left hip: Secondary | ICD-10-CM

## 2018-02-10 DIAGNOSIS — Z23 Encounter for immunization: Secondary | ICD-10-CM

## 2018-02-10 DIAGNOSIS — Z0000 Encounter for general adult medical examination without abnormal findings: Secondary | ICD-10-CM

## 2018-02-10 DIAGNOSIS — M1612 Unilateral primary osteoarthritis, left hip: Secondary | ICD-10-CM

## 2018-02-10 DIAGNOSIS — R7303 Prediabetes: Secondary | ICD-10-CM

## 2018-02-10 LAB — POCT GLYCOSYLATED HEMOGLOBIN (HGB A1C): HEMOGLOBIN A1C: 6.1 % — AB (ref 4.0–5.6)

## 2018-02-10 NOTE — Progress Notes (Signed)
Donald [name] 57 y.o.   Chief Complaint  Patient presents with  . Annual Exam  . Hip Pain    x 7-8 months   Lab Results  Component Value Date   HGBA1C 6.1 (H) 06/01/2017    HISTORY OF PRESENT ILLNESS: This is a 57 y.o. male here today for annual exam. Complaining of chronic left-sided hip pain for the past 7 to 8 months.  Denies any injuries.  X-ray on November 2018 reviewed.  It showed moderate degenerative joint disease of both hips left worse than right.  Patient has a history of prediabetes and he is on metformin daily. Also has a history of sleep apnea and uses CPAP machine nightly. No other significant complaints or medical concerns. Wt Readings from Last 3 Encounters:  02/10/18 265 lb 6.4 oz (120.4 kg)  07/26/17 245 lb (111.1 kg)  06/01/17 256 lb 9.6 oz (116.4 kg)   BP Readings from Last 3 Encounters:  02/10/18 134/77  07/27/17 127/85  06/01/17 124/84     HPI   Prior to Admission medications   Medication Sig Start Date End Date Taking? Authorizing Provider  aspirin 81 MG tablet Take 81 mg by mouth daily.      [provider]  Aspirin-Salicylamide-Caffeine (BC FAST PAIN RELIEF) 650-195-33.3 MG PACK Take 1 Package by mouth every 8 (eight) hours as needed (pain).    [provider]  blood glucose meter kit and supplies KIT Dispense based on patient and insurance preference. Use up to four times daily as directed. (FOR ICD-9 250.00, 250.01). 03/28/16   Alveda Reasons, MD  blood glucose meter kit and supplies Dispense based on patient and insurance preference. Use up to four times daily as directed. (FOR ICD-9 250.00, 250.01). 03/28/16   [name], MD  cholecalciferol (VITAMIN D) 1000 UNITS tablet Take 1,000 Units by mouth daily.    [provider]  cyclobenzaprine (FLEXERIL) 10 MG tablet Take 0.5-1 tablets (5-10 mg total) by mouth 3 (three) times daily as needed for muscle spasms. 06/01/17   [name], MD  ibuprofen (ADVIL,MOTRIN) 800 MG  tablet Take 1 tablet (800 mg total) by mouth 3 (three) times daily. 07/27/17   Larene Pickett, PA-C  metFORMIN (GLUCOPHAGE-XR) 500 MG 24 hr tablet Take 2 tablets (1,000 mg total) by mouth daily with breakfast. 06/01/17   [name], MD  Multiple Vitamin (MULTIVITAMIN) tablet Take 1 tablet by mouth daily.    [provider]  naproxen (NAPROSYN) 500 MG tablet Take 1 tablet (500 mg total) by mouth 2 (two) times daily with a meal. 06/18/17   [name], MD  traMADol (ULTRAM) 50 MG tablet Take 1-2 tablets (50-100 mg total) by mouth every 6 (six) hours as needed for moderate pain. Patient not taking: Reported on 07/27/2017 06/01/17   [name], MD    No Known Allergies  Patient Active Problem List   Diagnosis Date Noted  . Severe obesity (BMI >= 40) ([place]) 04/09/2013  . Type 2 diabetes mellitus ([place]) 04/01/2013  . Anemia, unspecified 04/01/2013  . Iron deficiency anemia, unspecified 11/21/2010    Past Medical History:  Diagnosis Date  . Anemia   . DM (diabetes mellitus) ([place])   . Hypertension   . Obesity   . Sleep apnea    on CPAP    Past Surgical History:  Procedure Laterality Date  . COLONOSCOPY  2009  . FOOT SURGERY      Social History  Socioeconomic History  . Marital status: Married    Spouse name: Not on file  . Number of children: Not on file  . Years of education: Not on file  . Highest education level: Not on file  Occupational History  . Occupation: Surveyor, quantity: [employer]  . Financial resource strain: Not on file  . Food insecurity:    Worry: Not on file    Inability: Not on file  . Transportation needs:    Medical: Not on file    Non-medical: Not on file  Tobacco Use  . Smoking status: Never Smoker  . Smokeless tobacco: Never Used  Substance and Sexual Activity  . Alcohol use: No  . Drug use: No  . Sexual activity: Not on file  Lifestyle  . Physical activity:    Days per week: Not on file    Minutes per session: Not on file   . Stress: Not on file  Relationships  . Social connections:    Talks on phone: Not on file    Gets together: Not on file    Attends religious service: Not on file    Active member of club or organization: Not on file    Attends meetings of clubs or organizations: Not on file    Relationship status: Not on file  . Intimate partner violence:    Fear of current or ex partner: Not on file    Emotionally abused: Not on file    Physically abused: Not on file    Forced sexual activity: Not on file  Other Topics Concern  . Not on file  Social History Narrative   Exercise calisthenics    Family History  Problem Relation Age of Onset  . Diabetes Sister 6  . Colon cancer Neg Hx      Review of Systems  Constitutional: Negative.  Negative for chills and fever.  HENT: Negative for hearing loss and sore throat.   Eyes: Negative.  Negative for blurred vision and double vision.  Respiratory: Negative.  Negative for cough, shortness of breath and wheezing.   Cardiovascular: Negative.  Negative for chest pain, palpitations and leg swelling.  Gastrointestinal: Negative for abdominal pain, blood in stool, diarrhea, melena, nausea and vomiting.  Genitourinary: Negative.  Negative for dysuria and hematuria.  Musculoskeletal: Positive for joint pain (hips). Negative for back pain, myalgias and neck pain.  Skin: Negative.  Negative for rash.  Neurological: Negative.  Negative for dizziness, sensory change, focal weakness and headaches.  Endo/Heme/Allergies: Negative.   All other systems reviewed and are negative.   Vitals:   02/10/18 0831  BP: 134/77  Pulse: 81  Resp: 16  Temp: 98.2 F (36.8 C)  SpO2: 99%    Physical Exam  Constitutional: He is oriented to person, place, and time. He appears well-developed and well-nourished.  HENT:  Head: Normocephalic and atraumatic.  Right Ear: External ear normal.  Left Ear: External ear normal.  Nose: Nose normal.  Mouth/Throat: Oropharynx  is clear and moist.  Eyes: Pupils are equal, round, and reactive to light. Conjunctivae and EOM are normal.  Neck: Normal range of motion. Neck supple. No JVD present. No thyromegaly present.  Cardiovascular: Normal rate, regular rhythm, normal heart sounds and intact distal pulses.  Pulmonary/Chest: Effort normal and breath sounds normal.  Abdominal: Soft. Bowel sounds are normal. He exhibits no distension. There is no tenderness.  Musculoskeletal: Normal range of motion. He exhibits no edema or tenderness.  Lymphadenopathy:  He has no cervical adenopathy.  Neurological: He is alert and oriented to person, place, and time. No sensory deficit. He exhibits normal muscle tone. Coordination normal.  Skin: Skin is warm and dry. Capillary refill takes less than 2 seconds. No rash noted.  Psychiatric: He has a normal mood and affect. His behavior is normal.  Vitals reviewed.    ASSESSMENT & PLAN: Nai was seen today for annual exam and hip pain.  Diagnoses and all orders for this visit:  Routine general medical examination at a health care facility -     Lipid panel -     Comprehensive metabolic panel  Prediabetes -     POCT glycosylated hemoglobin (Hb A1C)  Pain of left hip joint -     Ambulatory referral to Orthopedic Surgery  Osteoarthritis of left hip, unspecified osteoarthritis type -     Ambulatory referral to Orthopedic Surgery  Need for prophylactic vaccination and inoculation against influenza -     Flu Vaccine QUAD 36+ mos IM    Patient Instructions       If you have lab work done today you will be contacted with your lab results within the next 2 weeks.  If you have not heard from [place] then please contact us. The fastest way to get your results is to register for My Chart.   IF you received an x-ray today, you will receive an invoice from [facility]. Please contact [facility] at [phone] with questions or concerns regarding your invoice.     IF you received labwork today, you will receive an invoice from [facility]. Please contact [facility] at [phone] with questions or concerns regarding your invoice.   Our billing staff will not be able to assist you with questions regarding bills from these companies.  You will be contacted with the lab results as soon as they are available. The fastest way to get your results is to activate your My Chart account. Instructions are located on the last page of this paperwork. If you have not heard from [place] regarding the results in 2 weeks, please contact this office.      Health Maintenance, Male A healthy lifestyle and preventive care is important for your health and wellness. Ask your health care provider about what schedule of regular examinations is right for you. What should I know about weight and diet? Eat a Healthy Diet  Eat plenty of vegetables, fruits, whole grains, low-fat dairy products, and lean protein.  Do not eat a lot of foods high in solid fats, added sugars, or salt.  Maintain a Healthy Weight Regular exercise can help you achieve or maintain a healthy weight. You should:  Do at least 150 minutes of exercise each week. The exercise should increase your heart rate and make you sweat (moderate-intensity exercise).  Do strength-training exercises at least twice a week.  Watch Your Levels of Cholesterol and Blood Lipids  Have your blood tested for lipids and cholesterol every 5 years starting at 58 years of age. If you are at high risk for heart disease, you should start having your blood tested when you are 58 years old. You may need to have your cholesterol levels checked more often if: ? Your lipid or cholesterol levels are high. ? You are older than 58 years of age. ? You are at high risk for heart disease.  What should I know about cancer screening? Many types of cancers can be detected early and may often be prevented. Lung  Cancer  You should be screened  every year for lung cancer if: ? You are a current smoker who has smoked for at least 30 years. ? You are a former smoker who has quit within the past 15 years.  Talk to your health care provider about your screening options, when you should start screening, and how often you should be screened.  Colorectal Cancer  Routine colorectal cancer screening usually begins at 58 years of age and should be repeated every 5-10 years until you are 58 years old. You may need to be screened more often if early forms of precancerous polyps or small growths are found. Your health care provider may recommend screening at an earlier age if you have risk factors for colon cancer.  Your health care provider may recommend using home test kits to check for hidden blood in the stool.  A small camera at the end of a tube can be used to examine your colon (sigmoidoscopy or colonoscopy). This checks for the earliest forms of colorectal cancer.  Prostate and Testicular Cancer  Depending on your age and overall health, your health care provider may do certain tests to screen for prostate and testicular cancer.  Talk to your health care provider about any symptoms or concerns you have about testicular or prostate cancer.  Skin Cancer  Check your skin from head to toe regularly.  Tell your health care provider about any new moles or changes in moles, especially if: ? There is a change in a mole's size, shape, or color. ? You have a mole that is larger than a pencil eraser.  Always use sunscreen. Apply sunscreen liberally and repeat throughout the day.  Protect yourself by wearing long sleeves, pants, a wide-brimmed hat, and sunglasses when outside.  What should I know about heart disease, diabetes, and high blood pressure?  If you are 4-31 years of age, have your blood pressure checked every 3-5 years. If you are 23 years of age or older, have your blood pressure checked every year. You should have your blood  pressure measured twice-once when you are at a hospital or clinic, and once when you are not at a hospital or clinic. Record the average of the two measurements. To check your blood pressure when you are not at a hospital or clinic, you can use: ? An automated blood pressure machine at a pharmacy. ? A home blood pressure monitor.  Talk to your health care provider about your target blood pressure.  If you are between 6-104 years old, ask your health care provider if you should take aspirin to prevent heart disease.  Have regular diabetes screenings by checking your fasting blood sugar level. ? If you are at a normal weight and have a low risk for diabetes, have this test once every three years after the age of 53. ? If you are overweight and have a high risk for diabetes, consider being tested at a younger age or more often.  A one-time screening for abdominal aortic aneurysm (AAA) by ultrasound is recommended for men aged 57-75 years who are current or former smokers. What should I know about preventing infection? Hepatitis B If you have a higher risk for hepatitis B, you should be screened for this virus. Talk with your health care provider to find out if you are at risk for hepatitis B infection. Hepatitis C Blood testing is recommended for:  Everyone born from [birth year] through 1966.  Anyone with known risk factors for hepatitis  C.  Sexually Transmitted Diseases (STDs)  You should be screened each year for STDs including gonorrhea and chlamydia if: ? You are sexually active and are younger than 58 years of age. ? You are older than 58 years of age and your health care provider tells you that you are at risk for this type of infection. ? Your sexual activity has changed since you were last screened and you are at an increased risk for chlamydia or gonorrhea. Ask your health care provider if you are at risk.  Talk with your health care provider about whether you are at high risk of being  infected with HIV. Your health care provider may recommend a prescription medicine to help prevent HIV infection.  What else can I do?  Schedule regular health, dental, and eye exams.  Stay current with your vaccines (immunizations).  Do not use any tobacco products, such as cigarettes, chewing tobacco, and e-cigarettes. If you need help quitting, ask your health care provider.  Limit alcohol intake to no more than 2 drinks per day. One drink equals 12 ounces of beer, 5 ounces of wine, or 1 ounces of hard liquor.  Do not use street drugs.  Do not share needles.  Ask your health care provider for help if you need support or information about quitting drugs.  Tell your health care provider if you often feel depressed.  Tell your health care provider if you have ever been abused or do not feel safe at home. This information is not intended to replace advice given to you by your health care provider. Make sure you discuss any questions you have with your health care provider. Document Released: 10/25/2007 Document Revised: 12/26/2015 Document Reviewed: 01/30/2015 Elsevier Interactive Patient Education  2018 [employer].  Prediabetes Prediabetes is the condition of having a blood sugar (blood glucose) level that is higher than it should be, but not high enough for you to be diagnosed with type 2 diabetes. Having prediabetes puts you at risk for developing type 2 diabetes (type 2 diabetes mellitus). Prediabetes may be called impaired glucose tolerance or impaired fasting glucose. Prediabetes usually does not cause symptoms. Your health care provider can diagnose this condition with blood tests. You may be tested for prediabetes if you are overweight and if you have at least one other risk factor for prediabetes. Risk factors for prediabetes include:  Having a family member with type 2 diabetes.  Being overweight or obese.  Being older than age 39.  Being of American-Indian,  African-American, Hispanic/Latino, or Asian/Pacific Islander descent.  Having an inactive (sedentary) lifestyle.  Having a history of gestational diabetes or polycystic ovarian syndrome (PCOS).  Having low levels of good cholesterol (HDL-C) or high levels of blood fats (triglycerides).  Having high blood pressure.  What is blood glucose and how is blood glucose measured?  Blood glucose refers to the amount of glucose in your bloodstream. Glucose comes from eating foods that contain sugars and starches (carbohydrates) that the body breaks down into glucose. Your blood glucose level may be measured in mg/dL (milligrams per deciliter) or mmol/L (millimoles per liter).Your blood glucose may be checked with one or more of the following blood tests:  A fasting blood glucose (FBG) test. You will not be allowed to eat (you will fast) for at least 8 hours before a blood sample is taken. ? A normal range for FBG is 70-100 mg/dl (3.9-5.6 mmol/L).  An A1c (hemoglobin A1c) blood test. This test provides information about blood  glucose control over the previous 2?20month.  An oral glucose tolerance test (OGTT). This test measures your blood glucose twice: ? After fasting. This is your baseline level. ? Two hours after you drink a beverage that contains glucose.  You may be diagnosed with prediabetes:  If your FBG is 100?125 mg/dL (5.6-6.9 mmol/L).  If your A1c level is 5.7?6.4%.  If your OGGT result is 140?199 mg/dL (7.8-11 mmol/L).  These blood tests may be repeated to confirm your diagnosis. What happens if blood glucose is too high? The pancreas produces a hormone (insulin) that helps move glucose from the bloodstream into cells. When cells in the body do not respond properly to insulin that the body makes (insulin resistance), excess glucose builds up in the blood instead of going into cells. As a result, high blood glucose (hyperglycemia) can develop, which can cause many complications.  This is a symptom of prediabetes. What can happen if blood glucose stays higher than normal for a long time? Having high blood glucose for a long time is dangerous. Too much glucose in your blood can damage your nerves and blood vessels. Long-term damage can lead to complications from diabetes, which may include:  Heart disease.  Stroke.  Blindness.  Kidney disease.  Depression.  Poor circulation in the feet and legs, which could lead to surgical removal (amputation) in severe cases.  How can prediabetes be prevented from turning into type 2 diabetes?  To help prevent type 2 diabetes, take the following actions:  Be physically active. ? Do moderate-intensity physical activity for at least 30 minutes on at least 5 days of the week, or as much as told by your health care provider. This could be brisk walking, biking, or water aerobics. ? Ask your health care provider what activities are safe for you. A mix of physical activities may be best, such as walking, swimming, cycling, and strength training.  Lose weight as told by your health care provider. ? Losing 5-7% of your body weight can reverse insulin resistance. ? Your health care provider can determine how much weight loss is best for you and can help you lose weight safely.  Follow a healthy meal plan. This includes eating lean proteins, complex carbohydrates, fresh fruits and vegetables, low-fat dairy products, and healthy fats. ? Follow instructions from your health care provider about eating or drinking restrictions. ? Make an appointment to see a diet and nutrition specialist (registered dietitian) to help you create a healthy eating plan that is right for you.  Do not smoke or use any tobacco products, such as cigarettes, chewing tobacco, and e-cigarettes. If you need help quitting, ask your health care provider.  Take over-the-counter and prescription medicines as told by your health care provider. You may be prescribed  medicines that help lower the risk of type 2 diabetes.  This information is not intended to replace advice given to you by your health care provider. Make sure you discuss any questions you have with your health care provider. Document Released: 08/20/2015 Document Revised: 10/04/2015 Document Reviewed: 06/19/2015 Elsevier Interactive Patient Education  2018 [place]  Hip Pain The hip is the joint between the upper legs and the lower pelvis. The bones, cartilage, tendons, and muscles of your hip joint support your body and allow you to move around. Hip pain can range from a minor ache to severe pain in one or both of your hips. The pain may be felt on the inside of the hip joint near the groin,  or the outside near the buttocks and upper thigh. You may also have swelling or stiffness. Follow these instructions at home: Managing pain, stiffness, and swelling  If directed, apply ice to the injured area. ? Put ice in a plastic bag. ? Place a towel between your skin and the bag. ? Leave the ice on for 20 minutes, 2-3 times a day  Sleep with a pillow between your legs on your most comfortable side.  Avoid any activities that cause pain. General instructions  Take over-the-counter and prescription medicines only as told by your health care provider.  Do any exercises as told by your health care provider.  Record the following: ? How often you have hip pain. ? The location of your pain. ? What the pain feels like. ? What makes the pain worse.  Keep all follow-up visits as told by your health care provider. This is important. Contact a health care provider if:  You cannot put weight on your leg.  Your pain or swelling continues or gets worse after one week.  It gets harder to walk.  You have a fever. Get help right away if:  You fall.  You have a sudden increase in pain and swelling in your hip.  Your hip is red or swollen or very tender to touch. Summary  Hip pain can  range from a minor ache to severe pain in one or both of your hips.  The pain may be felt on the inside of the hip joint near the groin, or the outside near the buttocks and upper thigh.  Avoid any activities that cause pain.  Record how often you have hip pain, the location of the pain, what makes it worse and what it feels like. This information is not intended to replace advice given to you by your health care provider. Make sure you discuss any questions you have with your health care provider. Document Released: 10/16/2009 Document Revised: 03/31/2016 Document Reviewed: 03/31/2016 Elsevier Interactive Patient Education  2018 [employer]      Agustina Caroli, MD [facility]

## 2018-02-10 NOTE — Patient Instructions (Addendum)
   If you have lab work done today you will be contacted with your lab results within the next 2 weeks.  If you have not heard from us then please contact us. The fastest way to get your results is to register for My Chart.   IF you received an x-ray today, you will receive an invoice from [facility]. Please contact [facility] at [phone] with questions or concerns regarding your invoice.   IF you received labwork today, you will receive an invoice from [facility]. Please contact [facility] at [phone] with questions or concerns regarding your invoice.   Our billing staff will not be able to assist you with questions regarding bills from these companies.  You will be contacted with the lab results as soon as they are available. The fastest way to get your results is to activate your My Chart account. Instructions are located on the last page of this paperwork. If you have not heard from us regarding the results in 2 weeks, please contact this office.      Health Maintenance, Male A healthy lifestyle and preventive care is important for your health and wellness. Ask your health care provider about what schedule of regular examinations is right for you. What should I know about weight and diet? Eat a Healthy Diet  Eat plenty of vegetables, fruits, whole grains, low-fat dairy products, and lean protein.  Do not eat a lot of foods high in solid fats, added sugars, or salt.  Maintain a Healthy Weight Regular exercise can help you achieve or maintain a healthy weight. You should:  Do at least 150 minutes of exercise each week. The exercise should increase your heart rate and make you sweat (moderate-intensity exercise).  Do strength-training exercises at least twice a week.  Watch Your Levels of Cholesterol and Blood Lipids  Have your blood tested for lipids and cholesterol every 5 years starting at 58 years of age. If you are at high risk for heart disease, you  should start having your blood tested when you are 58 years old. You may need to have your cholesterol levels checked more often if: ? Your lipid or cholesterol levels are high. ? You are older than 58 years of age. ? You are at high risk for heart disease.  What should I know about cancer screening? Many types of cancers can be detected early and may often be prevented. Lung Cancer  You should be screened every year for lung cancer if: ? You are a current smoker who has smoked for at least 30 years. ? You are a former smoker who has quit within the past 15 years.  Talk to your health care provider about your screening options, when you should start screening, and how often you should be screened.  Colorectal Cancer  Routine colorectal cancer screening usually begins at 58 years of age and should be repeated every 5-10 years until you are 58 years old. You may need to be screened more often if early forms of precancerous polyps or small growths are found. Your health care provider may recommend screening at an earlier age if you have risk factors for colon cancer.  Your health care provider may recommend using home test kits to check for hidden blood in the stool.  A small camera at the end of a tube can be used to examine your colon (sigmoidoscopy or colonoscopy). This checks for the earliest forms of colorectal cancer.  Prostate and Testicular Cancer  Depending   on your age and overall health, your health care provider may do certain tests to screen for prostate and testicular cancer.  Talk to your health care provider about any symptoms or concerns you have about testicular or prostate cancer.  Skin Cancer  Check your skin from head to toe regularly.  Tell your health care provider about any new moles or changes in moles, especially if: ? There is a change in a mole's size, shape, or color. ? You have a mole that is larger than a pencil eraser.  Always use sunscreen. Apply  sunscreen liberally and repeat throughout the day.  Protect yourself by wearing long sleeves, pants, a wide-brimmed hat, and sunglasses when outside.  What should I know about heart disease, diabetes, and high blood pressure?  If you are 18-39 years of age, have your blood pressure checked every 3-5 years. If you are 40 years of age or older, have your blood pressure checked every year. You should have your blood pressure measured twice-once when you are at a hospital or clinic, and once when you are not at a hospital or clinic. Record the average of the two measurements. To check your blood pressure when you are not at a hospital or clinic, you can use: ? An automated blood pressure machine at a pharmacy. ? A home blood pressure monitor.  Talk to your health care provider about your target blood pressure.  If you are between 45-79 years old, ask your health care provider if you should take aspirin to prevent heart disease.  Have regular diabetes screenings by checking your fasting blood sugar level. ? If you are at a normal weight and have a low risk for diabetes, have this test once every three years after the age of 45. ? If you are overweight and have a high risk for diabetes, consider being tested at a younger age or more often.  A one-time screening for abdominal aortic aneurysm (AAA) by ultrasound is recommended for men aged 65-75 years who are current or former smokers. What should I know about preventing infection? Hepatitis B If you have a higher risk for hepatitis B, you should be screened for this virus. Talk with your health care provider to find out if you are at risk for hepatitis B infection. Hepatitis C Blood testing is recommended for:  Everyone born from 1945 through 1965.  Anyone with known risk factors for hepatitis C.  Sexually Transmitted Diseases (STDs)  You should be screened each year for STDs including gonorrhea and chlamydia if: ? You are sexually active  and are younger than 58 years of age. ? You are older than 58 years of age and your health care provider tells you that you are at risk for this type of infection. ? Your sexual activity has changed since you were last screened and you are at an increased risk for chlamydia or gonorrhea. Ask your health care provider if you are at risk.  Talk with your health care provider about whether you are at high risk of being infected with HIV. Your health care provider may recommend a prescription medicine to help prevent HIV infection.  What else can I do?  Schedule regular health, dental, and eye exams.  Stay current with your vaccines (immunizations).  Do not use any tobacco products, such as cigarettes, chewing tobacco, and e-cigarettes. If you need help quitting, ask your health care provider.  Limit alcohol intake to no more than 2 drinks per day. One drink equals   12 ounces of beer, 5 ounces of wine, or 1 ounces of hard liquor.  Do not use street drugs.  Do not share needles.  Ask your health care provider for help if you need support or information about quitting drugs.  Tell your health care provider if you often feel depressed.  Tell your health care provider if you have ever been abused or do not feel safe at home. This information is not intended to replace advice given to you by your health care provider. Make sure you discuss any questions you have with your health care provider. Document Released: 10/25/2007 Document Revised: 12/26/2015 Document Reviewed: 01/30/2015 Elsevier Interactive Patient Education  2018 [employer].  Prediabetes Prediabetes is the condition of having a blood sugar (blood glucose) level that is higher than it should be, but not high enough for you to be diagnosed with type 2 diabetes. Having prediabetes puts you at risk for developing type 2 diabetes (type 2 diabetes mellitus). Prediabetes may be called impaired glucose tolerance or impaired fasting  glucose. Prediabetes usually does not cause symptoms. Your health care provider can diagnose this condition with blood tests. You may be tested for prediabetes if you are overweight and if you have at least one other risk factor for prediabetes. Risk factors for prediabetes include:  Having a family member with type 2 diabetes.  Being overweight or obese.  Being older than age 42.  Being of American-Indian, African-American, Hispanic/Latino, or Asian/Pacific Islander descent.  Having an inactive (sedentary) lifestyle.  Having a history of gestational diabetes or polycystic ovarian syndrome (PCOS).  Having low levels of good cholesterol (HDL-C) or high levels of blood fats (triglycerides).  Having high blood pressure.  What is blood glucose and how is blood glucose measured?  Blood glucose refers to the amount of glucose in your bloodstream. Glucose comes from eating foods that contain sugars and starches (carbohydrates) that the body breaks down into glucose. Your blood glucose level may be measured in mg/dL (milligrams per deciliter) or mmol/L (millimoles per liter).Your blood glucose may be checked with one or more of the following blood tests:  A fasting blood glucose (FBG) test. You will not be allowed to eat (you will fast) for at least 8 hours before a blood sample is taken. ? A normal range for FBG is 70-100 mg/dl (3.9-5.6 mmol/L).  An A1c (hemoglobin A1c) blood test. This test provides information about blood glucose control over the previous 2?12months.  An oral glucose tolerance test (OGTT). This test measures your blood glucose twice: ? After fasting. This is your baseline level. ? Two hours after you drink a beverage that contains glucose.  You may be diagnosed with prediabetes:  If your FBG is 100?125 mg/dL (5.6-6.9 mmol/L).  If your A1c level is 5.7?6.4%.  If your OGGT result is 140?199 mg/dL (7.8-11 mmol/L).  These blood tests may be repeated to confirm your  diagnosis. What happens if blood glucose is too high? The pancreas produces a hormone (insulin) that helps move glucose from the bloodstream into cells. When cells in the body do not respond properly to insulin that the body makes (insulin resistance), excess glucose builds up in the blood instead of going into cells. As a result, high blood glucose (hyperglycemia) can develop, which can cause many complications. This is a symptom of prediabetes. What can happen if blood glucose stays higher than normal for a long time? Having high blood glucose for a long time is dangerous. Too much glucose in your  blood can damage your nerves and blood vessels. Long-term damage can lead to complications from diabetes, which may include:  Heart disease.  Stroke.  Blindness.  Kidney disease.  Depression.  Poor circulation in the feet and legs, which could lead to surgical removal (amputation) in severe cases.  How can prediabetes be prevented from turning into type 2 diabetes?  To help prevent type 2 diabetes, take the following actions:  Be physically active. ? Do moderate-intensity physical activity for at least 30 minutes on at least 5 days of the week, or as much as told by your health care provider. This could be brisk walking, biking, or water aerobics. ? Ask your health care provider what activities are safe for you. A mix of physical activities may be best, such as walking, swimming, cycling, and strength training.  Lose weight as told by your health care provider. ? Losing 5-7% of your body weight can reverse insulin resistance. ? Your health care provider can determine how much weight loss is best for you and can help you lose weight safely.  Follow a healthy meal plan. This includes eating lean proteins, complex carbohydrates, fresh fruits and vegetables, low-fat dairy products, and healthy fats. ? Follow instructions from your health care provider about eating or drinking  restrictions. ? Make an appointment to see a diet and nutrition specialist (registered dietitian) to help you create a healthy eating plan that is right for you.  Do not smoke or use any tobacco products, such as cigarettes, chewing tobacco, and e-cigarettes. If you need help quitting, ask your health care provider.  Take over-the-counter and prescription medicines as told by your health care provider. You may be prescribed medicines that help lower the risk of type 2 diabetes.  This information is not intended to replace advice given to you by your health care provider. Make sure you discuss any questions you have with your health care provider. Document Released: 08/20/2015 Document Revised: 10/04/2015 Document Reviewed: 06/19/2015 Elsevier Interactive Patient Education  2018 [employer].  Hip Pain The hip is the joint between the upper legs and the lower pelvis. The bones, cartilage, tendons, and muscles of your hip joint support your body and allow you to move around. Hip pain can range from a minor ache to severe pain in one or both of your hips. The pain may be felt on the inside of the hip joint near the groin, or the outside near the buttocks and upper thigh. You may also have swelling or stiffness. Follow these instructions at home: Managing pain, stiffness, and swelling  If directed, apply ice to the injured area. ? Put ice in a plastic bag. ? Place a towel between your skin and the bag. ? Leave the ice on for 20 minutes, 2-3 times a day  Sleep with a pillow between your legs on your most comfortable side.  Avoid any activities that cause pain. General instructions  Take over-the-counter and prescription medicines only as told by your health care provider.  Do any exercises as told by your health care provider.  Record the following: ? How often you have hip pain. ? The location of your pain. ? What the pain feels like. ? What makes the pain worse.  Keep all follow-up  visits as told by your health care provider. This is important. Contact a health care provider if:  You cannot put weight on your leg.  Your pain or swelling continues or gets worse after one week.  It gets  harder to walk.  You have a fever. Get help right away if:  You fall.  You have a sudden increase in pain and swelling in your hip.  Your hip is red or swollen or very tender to touch. Summary  Hip pain can range from a minor ache to severe pain in one or both of your hips.  The pain may be felt on the inside of the hip joint near the groin, or the outside near the buttocks and upper thigh.  Avoid any activities that cause pain.  Record how often you have hip pain, the location of the pain, what makes it worse and what it feels like. This information is not intended to replace advice given to you by your health care provider. Make sure you discuss any questions you have with your health care provider. Document Released: 10/16/2009 Document Revised: 03/31/2016 Document Reviewed: 03/31/2016 Elsevier Interactive Patient Education  [employer].

## 2018-02-11 LAB — COMPREHENSIVE METABOLIC PANEL
ALBUMIN: 4.2 g/dL (ref 3.5–5.5)
ALK PHOS: 60 IU/L (ref 39–117)
ALT: 20 IU/L (ref 0–44)
AST: 19 IU/L (ref 0–40)
Albumin/Globulin Ratio: 1.3 (ref 1.2–2.2)
BILIRUBIN TOTAL: 1 mg/dL (ref 0.0–1.2)
BUN / CREAT RATIO: 16 (ref 9–20)
BUN: 9 mg/dL (ref 6–24)
CO2: 23 mmol/L (ref 20–29)
Calcium: 9.2 mg/dL (ref 8.7–10.2)
Chloride: 102 mmol/L (ref 96–106)
Creatinine, Ser: 0.58 mg/dL — ABNORMAL LOW (ref 0.76–1.27)
GFR, EST AFRICAN AMERICAN: 130 mL/min/{1.73_m2} (ref >59)
GFR, EST NON AFRICAN AMERICAN: 112 mL/min/{1.73_m2} (ref >59)
Globulin, Total: 3.3 g/dL (ref 1.5–4.5)
Glucose: 112 mg/dL — ABNORMAL HIGH (ref 65–99)
Potassium: 4.3 mmol/L (ref 3.5–5.2)
Sodium: 139 mmol/L (ref 134–144)
Total Protein: 7.5 g/dL (ref 6.0–8.5)

## 2018-02-11 LAB — LIPID PANEL
CHOLESTEROL TOTAL: 142 mg/dL (ref 100–199)
Chol/HDL Ratio: 3.8 ratio (ref 0.0–5.0)
HDL: 37 mg/dL — AB (ref >39)
LDL Calculated: 88 mg/dL (ref 0–99)
TRIGLYCERIDES: 84 mg/dL (ref 0–149)
VLDL CHOLESTEROL CAL: 17 mg/dL (ref 5–40)

## 2018-03-04 NOTE — Telephone Encounter (Signed)
DONE

## 2018-04-12 ENCOUNTER — Encounter (INDEPENDENT_AMBULATORY_CARE_PROVIDER_SITE_OTHER): Payer: Self-pay | Admitting: Orthopaedic Surgery

## 2018-04-12 ENCOUNTER — Ambulatory Visit (INDEPENDENT_AMBULATORY_CARE_PROVIDER_SITE_OTHER): Payer: 59 | Admitting: Orthopaedic Surgery

## 2018-04-12 VITALS — BP 135/79 | HR 96 | Resp 18 | Ht 68.0 in | Wt 271.5 lb

## 2018-04-12 DIAGNOSIS — Z6841 Body Mass Index (BMI) 40.0 and over, adult: Secondary | ICD-10-CM

## 2018-04-12 DIAGNOSIS — M25552 Pain in left hip: Secondary | ICD-10-CM | POA: Diagnosis not present

## 2018-04-12 NOTE — Progress Notes (Signed)
Office Visit Note   Patient: Donald [name]           Date of Birth: 05-19-1960           MRN: [id number] Visit Date: 04/12/2018              Requested by: Horald Pollen, MD [place], [place] [id number] PCP: [name], MD   Assessment & Plan: Visit Diagnoses:  1. Pain of left hip joint   2. Class 3 severe obesity due to excess calories without serious comorbidity with body mass index (BMI) of 40.0 to 44.9 in adult [facility])     Plan: Osteoarthritis left hip.  Long discussion regarding diagnosis and treatment options.  He would like to avoid surgery.  We will try intra-articular cortisone injection  Follow-Up Instructions: Return if symptoms worsen or fail to improve.   Orders:  No orders of the defined types were placed in this encounter.  No orders of the defined types were placed in this encounter.     Procedures: No procedures performed   Clinical Data: No additional findings.   Subjective: Chief Complaint  Patient presents with  . Left Hip - Pain  . Hip Pain    Left hip pain, diabetic, no injury, no surgery to left hip, difficulty walking, difficulty sleeping at night, Aleve helps  Mr. Donald [name] is 57 years old visited the office for evaluation of left hip pain.  He is been having trouble for well over a year.  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  He has been diagnosed with osteoarthritis by prior films.  He has been taking 2 Aleve twice a day with reasonable results.  He denies any numbness or tingling.  Has some mild low back pain but his biggest problem is discomfort in his left groin and anterior right thigh.  Has history of obesity with BMI over 40 with type 2 diabetes. He has had prior films of his left hip in November 2018 demonstrating arthritic changes with decrease in the superior joint space associated with subchondral cysts and sclerosis  HPI  Review of Systems  Constitutional: Negative for fatigue.    HENT: Negative for trouble swallowing.   Eyes: Negative for pain.  Respiratory: Negative for shortness of breath.   Cardiovascular: Negative for leg swelling.  Gastrointestinal: Negative for constipation.  Endocrine: Negative for cold intolerance.  Genitourinary: Negative for difficulty urinating.  Musculoskeletal: Positive for back pain.  Skin: Negative for rash.  Allergic/Immunologic: Negative for food allergies.  Neurological: Negative for weakness.  Hematological: Does not bruise/bleed easily.  Psychiatric/Behavioral: Positive for sleep disturbance.     Objective: Vital Signs: BP 135/79 (BP Location: Left Arm, Patient Position: Sitting, Cuff Size: Normal)   Pulse 96   Resp 18   Ht 5\' 8"  (1.727 m)   Wt 271 lb 8 oz (123.2 kg)   BMI 41.28 kg/m   Physical Exam  Constitutional: He is oriented to person, place, and time. He appears well-developed and well-nourished.  HENT:  Mouth/Throat: Oropharynx is clear and moist.  Eyes: Pupils are equal, round, and reactive to light. EOM are normal.  Pulmonary/Chest: Effort normal.  Neurological: He is alert and oriented to person, place, and time.  Skin: Skin is warm and dry.  Psychiatric: He has a normal mood and affect. His behavior is normal.    Ortho Exam awake alert and oriented x3.  Comfortable sitting.  Definite limp  related to his left hip.  Significant decreased motion of his left hip compared to his right with only about 10 to 15 degrees of internal and external rotation.  Motor exam intact.  Straight leg raise negative.  No percussible tenderness of lumbar spine  Specialty Comments:  No specialty comments available.  Imaging: No results found.   PMFS History: Patient Active Problem List   Diagnosis Date Noted  . Severe obesity (BMI >= 40) ([place]) 04/09/2013  . Type 2 diabetes mellitus ([place]) 04/01/2013  . Anemia, unspecified 04/01/2013  . Iron deficiency anemia, unspecified 11/21/2010   Past Medical History:   Diagnosis Date  . Anemia   . DM (diabetes mellitus) ([place])   . Hypertension   . Obesity   . Sleep apnea    on CPAP    Family History  Problem Relation Age of Onset  . Diabetes Sister 36  . Colon cancer Neg Hx     Past Surgical History:  Procedure Laterality Date  . COLONOSCOPY  2009  . FOOT SURGERY     Social History   Occupational History  . Occupation: Surveyor, quantity: [employer]  Tobacco Use  . Smoking status: Never Smoker  . Smokeless tobacco: Never Used  Substance and Sexual Activity  . Alcohol use: No  . Drug use: No  . Sexual activity: Not on file

## 2018-04-20 ENCOUNTER — Ambulatory Visit
Admission: RE | Admit: 2018-04-20 | Discharge: 2018-04-20 | Disposition: A | Discharge status: Home / Self Care | Payer: 59 | Source: Ambulatory Visit | Attending: Orthopaedic Surgery | Admitting: Orthopaedic Surgery

## 2018-04-20 DIAGNOSIS — M25552 Pain in left hip: Secondary | ICD-10-CM

## 2018-04-20 MED ORDER — METHYLPREDNISOLONE ACETATE 40 MG/ML INJ SUSP (RADIOLOG
120.0000 mg | Freq: Once | INTRAMUSCULAR | Status: AC
  Administered 2018-04-20: 120 mg via INTRA_ARTICULAR

## 2018-04-20 MED ORDER — IOPAMIDOL (ISOVUE-M 200) INJECTION 41%
1.0000 mL | Freq: Once | INTRAMUSCULAR | Status: AC
  Administered 2018-04-20: 1 mL via INTRA_ARTICULAR

## 2018-05-24 ENCOUNTER — Other Ambulatory Visit: Payer: Self-pay | Admitting: Family Medicine

## 2018-08-25 ENCOUNTER — Other Ambulatory Visit: Payer: Self-pay | Admitting: Family Medicine

## 2018-08-25 DIAGNOSIS — E119 Type 2 diabetes mellitus without complications: Secondary | ICD-10-CM

## 2018-08-25 NOTE — Telephone Encounter (Signed)
Requested medication (s) are due for refill today: n/a  Requested medication (s) are on the active medication list: n/a  Last refill:  03/29/2012  Future visit scheduled: No  Notes to clinic:  Dr. Brigitte Pulse pt. Not on medication list.    Requested Prescriptions  Pending Prescriptions Disp Refills   glucose blood (ONE TOUCH ULTRA TEST) test strip 100 each 3    Sig: Use as directed     Endocrinology: Diabetes - Testing Supplies Passed - 08/25/2018  9:00 AM      Passed - Valid encounter within last 12 months    Recent Outpatient Visits          6 months ago Routine general medical examination at a health care facility   [facility] [place], [place], MD   1 year ago Left-sided low back pain without sciatica, unspecified chronicity   [facility], Laurey Arrow, MD   1 year ago Left hip pain   [facility], Ranell Patrick, MD   1 year ago Acute midline low back pain without sciatica   [facility] [place], [place] D, [place]   2 years ago Type 2 diabetes mellitus without complication, without long-term current use of insulin [facility])   [facility] [place], [place], [place]

## 2018-08-27 MED ORDER — GLUCOSE BLOOD VI STRP: ORAL_STRIP | 3 refills | Status: AC

## 2018-10-03 DIAGNOSIS — R52 Pain, unspecified: Secondary | ICD-10-CM | POA: Diagnosis not present

## 2018-10-11 ENCOUNTER — Other Ambulatory Visit: Payer: Self-pay

## 2018-10-11 ENCOUNTER — Encounter (HOSPITAL_COMMUNITY): Payer: Self-pay | Admitting: *Deleted

## 2018-10-11 ENCOUNTER — Emergency Department (HOSPITAL_COMMUNITY)
Admission: EM | Admit: 2018-10-11 | Discharge: 2018-10-11 | Disposition: A | Discharge status: Home / Self Care | Payer: 59 | Attending: Emergency Medicine | Admitting: Emergency Medicine

## 2018-10-11 ENCOUNTER — Emergency Department (HOSPITAL_COMMUNITY): Payer: 59

## 2018-10-11 DIAGNOSIS — Z7982 Long term (current) use of aspirin: Secondary | ICD-10-CM | POA: Diagnosis not present

## 2018-10-11 DIAGNOSIS — Z79899 Other long term (current) drug therapy: Secondary | ICD-10-CM | POA: Insufficient documentation

## 2018-10-11 DIAGNOSIS — E119 Type 2 diabetes mellitus without complications: Secondary | ICD-10-CM | POA: Diagnosis not present

## 2018-10-11 DIAGNOSIS — I10 Essential (primary) hypertension: Secondary | ICD-10-CM | POA: Diagnosis not present

## 2018-10-11 DIAGNOSIS — G8929 Other chronic pain: Secondary | ICD-10-CM | POA: Diagnosis not present

## 2018-10-11 DIAGNOSIS — M25552 Pain in left hip: Secondary | ICD-10-CM | POA: Diagnosis not present

## 2018-10-11 DIAGNOSIS — Z7984 Long term (current) use of oral hypoglycemic drugs: Secondary | ICD-10-CM | POA: Diagnosis not present

## 2018-10-11 NOTE — ED Provider Notes (Signed)
[facility] EMERGENCY DEPARTMENT Provider Note   CSN: [id number] Arrival date & time: 10/11/18  1440    History   Chief Complaint Chief Complaint  Patient presents with  . Hip Pain    HPI HUIE GHUMAN is a 58 y.o. male with past medical history of diabetes, hypertension, chronic left hip pain, presenting to the emergency department from urgent care with concern for possible femoral neck fracture.  Patient states he has chronic left hip pain, followed by orthopedics.  He states he has been out of work for 10 weeks and recently went back.  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  He developed worsening pain that is worse in the left lateral hip/buttock region.  He was evaluated at urgent care today and had an x-ray done.  He states they could not rule out a possible "compression fracture" of the femoral neck and therefore he presents to the ED for further evaluation.  He states he had no particular new injury.  No back pain or radiation of pain down his leg.  No new numbness or weakness. Of note, patient followed by Dr. Durward Fortes with [facility] orthopedics.    The history is provided by the patient.    Past Medical History:  Diagnosis Date  . Anemia   . DM (diabetes mellitus) ([place])   . Hypertension   . Obesity   . Sleep apnea    on CPAP    Patient Active Problem List   Diagnosis Date Noted  . Severe obesity (BMI >= 40) ([place]) 04/09/2013  . Type 2 diabetes mellitus ([place]) 04/01/2013  . Anemia, unspecified 04/01/2013  . Iron deficiency anemia, unspecified 11/21/2010    Past Surgical History:  Procedure Laterality Date  . COLONOSCOPY  2009  . FOOT SURGERY          Home Medications    Prior to Admission medications   Medication Sig Start Date End Date Taking? Authorizing Provider  aspirin 81 MG tablet Take 81 mg by mouth daily.      [provider]  Aspirin-Salicylamide-Caffeine (BC FAST PAIN RELIEF) 650-195-33.3 MG PACK  Take 1 Package by mouth every 8 (eight) hours as needed (pain).    [provider]  blood glucose meter kit and supplies KIT Dispense based on patient and insurance preference. Use up to four times daily as directed. (FOR ICD-9 250.00, 250.01). 03/28/16   Alveda Reasons, MD  blood glucose meter kit and supplies Dispense based on patient and insurance preference. Use up to four times daily as directed. (FOR ICD-9 250.00, 250.01). 03/28/16   [name], MD  cholecalciferol (VITAMIN D) 1000 UNITS tablet Take 1,000 Units by mouth daily.    [provider]  cyclobenzaprine (FLEXERIL) 10 MG tablet Take 0.5-1 tablets (5-10 mg total) by mouth 3 (three) times daily as needed for muscle spasms. 06/01/17   [name], MD  glucose blood (ONE TOUCH ULTRA TEST) test strip Use as directed 08/27/18   Forrest Moron, MD  ibuprofen (ADVIL,MOTRIN) 800 MG tablet Take 1 tablet (800 mg total) by mouth 3 (three) times daily. 07/27/17   Larene Pickett, PA-C  MAGNESIUM PO Take by mouth.    [provider]  metFORMIN (GLUCOPHAGE-XR) 500 MG 24 hr tablet TAKE 1 TABLET (500 MG TOTAL) BY MOUTH 2 (TWO) TIMES DAILY. 05/25/18   [name], MD  Multiple Vitamin (MULTIVITAMIN) tablet Take 1 tablet by mouth daily.  [provider]  naproxen (NAPROSYN) 500 MG tablet Take 1 tablet (500 mg total) by mouth 2 (two) times daily with a meal. 06/18/17   [name], MD  traMADol (ULTRAM) 50 MG tablet Take 1-2 tablets (50-100 mg total) by mouth every 6 (six) hours as needed for moderate pain. 06/01/17   [name], MD    Family History Family History  Problem Relation Age of Onset  . Diabetes Sister 36  . Colon cancer Neg Hx     Social History Social History   Tobacco Use  . Smoking status: Never Smoker  . Smokeless tobacco: Never Used  Substance Use Topics  . Alcohol use: No  . Drug use: No     Allergies   Patient has no known allergies.   Review of Systems Review of Systems   Musculoskeletal: Positive for arthralgias.  All other systems reviewed and are negative.    Physical Exam Updated Vital Signs BP (!) 160/71 (BP Location: Right Arm)   Pulse 86   Temp 98.5 F (36.9 C) (Oral)   Resp 18   SpO2 99%   Physical Exam Vitals signs and nursing note reviewed.  Constitutional:      General: He is not in acute distress.    Appearance: He is well-developed. He is obese.  HENT:     Head: Normocephalic and atraumatic.  Eyes:     Conjunctiva/sclera: Conjunctivae normal.  Cardiovascular:     Rate and Rhythm: Normal rate.  Pulmonary:     Effort: Pulmonary effort is normal.  Abdominal:     Palpations: Abdomen is soft.     Tenderness: There is no abdominal tenderness.  Musculoskeletal:     Comments: Some tenderness to the left gluteal region.  No pain to the midline spine or left proximal leg.  No pain with internal and external rotation of the hip.  Normal gait.  Normal distal sensation.  No redness or swelling.  Skin:    General: Skin is warm.  Neurological:     Mental Status: He is alert.  Psychiatric:        Behavior: Behavior normal.      ED Treatments / Results  Labs (all labs ordered are listed, but only abnormal results are displayed) Labs Reviewed - No data to display  EKG None  Radiology Dg Hip Unilat With Pelvis 2-3 Views Left  Result Date: 10/11/2018 CLINICAL DATA:  Chronic pain in left hip. EXAM: DG HIP (WITH OR WITHOUT PELVIS) 2-3V LEFT COMPARISON:  March 23, 2017 FINDINGS: There are degenerative changes in the left hip with osteophytes off the femoral head and loss of joint space superiorly. No convincing evidence of fracture. No dislocation. No other acute abnormalities. IMPRESSION: Degenerative changes in the left hip with loss of joint space superiorly and osteophytes. No convincing evidence of fracture. Electronically Signed   By: Dorise Bullion III M.D   On: 10/11/2018 21:43    Procedures Procedures (including critical  care time)  Medications Ordered in ED Medications - No data to display   Initial Impression / Assessment and Plan / ED Course  I have reviewed the triage vital signs and the nursing notes.  Pertinent labs & imaging results that were available during my care of the patient were reviewed by me and considered in my medical decision making (see chart for details).        Patient with chronic left hip pain, presenting with worsening pain after returning to work, after being off  for 10 weeks.  He stands for long periods of time, he is a Training and development officer.  He is chronic left hip pain secondary to arthritis, followed by Dr. Durward Fortes.  He was evaluated in urgent care today who obtained an x-ray and was interpreted as unable to r/o compression fx. he was sent to the ED for further evaluation.  He denies any new injuries or falls.  He takes over-the-counter medications for his symptoms.  He has not followed up with orthopedics.  His pain is located in the left gluteal region, no radiation of pain or neuro symptoms.  Exam is without red flags.  Ambulatory in the ED with normal gait.  Patient provided disc of imaging, however very poor quality, therefore repeat x-ray was done.  X-ray read with " no convincing evidence of fracture.".  This was discussed with Dr. Vanita Panda, at this time do not feel further advanced imaging is indicated, with absence of new injury and normal gait.  Suspect this is secondary to recent prolonged time off of work and then sudden change in activity level, likely exacerbated his chronic pain.  Patient is agreeable to this plan, was prescribed cyclobenzaprine by urgent care.  Instructed to take this as prescribed.  Topical ice or heat as needed.  Patient agreeable to plan.  Follow-up with Ortho.  Safe for discharge.  Discussed results, findings, treatment and follow up. Patient advised of return precautions. Patient verbalized understanding and agreed with plan.   Final Clinical Impressions(s) /  ED Diagnoses   Final diagnoses:  Chronic left hip pain    ED Discharge Orders    None       Doxie Augenstein, [place] N, PA-C 10/11/18 2216    Carmin Muskrat, MD 10/12/18 2344

## 2018-10-11 NOTE — ED Triage Notes (Signed)
Pt in c/o worsened left hip pain, states the pain is chronic, went to an urgent care today and they did an xray that could not r/o a femoral neck fx, sent for further eval, ambulatory

## 2018-10-11 NOTE — ED Notes (Signed)
Patient verbalizes understanding of discharge instructions . Opportunity for questions and answers were provided . Armband removed by staff ,Pt discharged from ED. W/C  offered at D/C  and Declined W/C at D/C and was escorted to lobby by RN.  

## 2018-10-11 NOTE — ED Notes (Addendum)
Pt here from UC to get a CT scan done of his left hip. Pt has been having left hip pain going on 3-4 months now.

## 2018-10-11 NOTE — Discharge Instructions (Addendum)
Your x-ray here looks reassuring.  There is no evidence of fracture.   Take the muscle answer as prescribed by urgent care. Apply ice or heat symptom relief. Follow-up with your orthopedic specialist to discuss your chronic hip pain.

## 2018-12-20 ENCOUNTER — Encounter: Payer: Self-pay | Admitting: Emergency Medicine

## 2018-12-20 ENCOUNTER — Telehealth (INDEPENDENT_AMBULATORY_CARE_PROVIDER_SITE_OTHER): Payer: 59 | Admitting: Emergency Medicine

## 2018-12-20 VITALS — Ht 69.0 in | Wt 284.0 lb

## 2018-12-20 DIAGNOSIS — R197 Diarrhea, unspecified: Secondary | ICD-10-CM | POA: Diagnosis not present

## 2018-12-20 DIAGNOSIS — B349 Viral infection, unspecified: Secondary | ICD-10-CM

## 2018-12-20 NOTE — Progress Notes (Signed)
Called patient to triage for appointment. Patient states it started yesterday with a fever of 100 degrees, diarrhea and swelling of his gumline. Patient states symptoms are better and this morning the temp was 98.5 degrees. Patient states at 1:00 pm today he took Ibuprofen.

## 2018-12-20 NOTE — Progress Notes (Signed)
Telemedicine Encounter- SOAP NOTE Established Patient  This telephone encounter was conducted with the patient's (or proxy's) verbal consent via audio telecommunications: yes/no: Yes Patient was instructed to have this encounter in a suitably private space; and to only have persons present to whom they give permission to participate. In addition, patient identity was confirmed by use of name plus two identifiers (DOB and address).  I discussed the limitations, risks, security and privacy concerns of performing an evaluation and management service by telephone and the availability of in person appointments. I also discussed with the patient that there may be a patient responsible charge related to this service. The patient expressed understanding and agreed to proceed.  I spent a total of TIME; 0 MIN TO 60 MIN: 15 minutes talking with the patient or their proxy.  No chief complaint on file. Diarrhea  Subjective   Donald [name] is a 58 y.o. male established patient. Telephone visit today complaining of diarrhea that started on Sunday morning and much better today.  Denies nausea or vomiting.  Had abdominal cramping with low-grade fever.  Nonbloody diarrhea.  Has been mostly on fluids the last 24 hours and feels much improved.  No other significant symptoms.  HPI   Patient Active Problem List   Diagnosis Date Noted  . Severe obesity (BMI >= 40) ([place]) 04/09/2013  . Type 2 diabetes mellitus ([place]) 04/01/2013  . Iron deficiency anemia, unspecified 11/21/2010    Past Medical History:  Diagnosis Date  . Anemia   . DM (diabetes mellitus) ([place])   . Hypertension   . Obesity   . Sleep apnea    on CPAP    Current Outpatient Medications  Medication Sig Dispense Refill  . aspirin 81 MG tablet Take 81 mg by mouth daily.      . cholecalciferol (VITAMIN D) 1000 UNITS tablet Take 1,000 Units by mouth daily.    . cyclobenzaprine (FLEXERIL) 10 MG tablet Take 0.5-1 tablets (5-10 mg total) by  mouth 3 (three) times daily as needed for muscle spasms. 30 tablet 1  . glucose blood (ONE TOUCH ULTRA TEST) test strip Use as directed 100 each 3  . ibuprofen (ADVIL,MOTRIN) 800 MG tablet Take 1 tablet (800 mg total) by mouth 3 (three) times daily. 21 tablet 0  . MAGNESIUM PO Take by mouth.    . metFORMIN (GLUCOPHAGE-XR) 500 MG 24 hr tablet TAKE 1 TABLET (500 MG TOTAL) BY MOUTH 2 (TWO) TIMES DAILY. 180 tablet 1  . Multiple Vitamin (MULTIVITAMIN) tablet Take 1 tablet by mouth daily.    . Aspirin-Salicylamide-Caffeine (BC FAST PAIN RELIEF) 650-195-33.3 MG PACK Take 1 Package by mouth every 8 (eight) hours as needed (pain).    . blood glucose meter kit and supplies KIT Dispense based on patient and insurance preference. Use up to four times daily as directed. (FOR ICD-9 250.00, 250.01). 1 each 0  . blood glucose meter kit and supplies Dispense based on patient and insurance preference. Use up to four times daily as directed. (FOR ICD-9 250.00, 250.01). 1 each 0  . naproxen (NAPROSYN) 500 MG tablet Take 1 tablet (500 mg total) by mouth 2 (two) times daily with a meal. (Patient not taking: Reported on 12/20/2018) 60 tablet 0  . traMADol (ULTRAM) 50 MG tablet Take 1-2 tablets (50-100 mg total) by mouth every 6 (six) hours as needed for moderate pain. (Patient not taking: Reported on 12/20/2018) 30 tablet 0   No current facility-administered medications for this visit.  No Known Allergies  Social History   Socioeconomic History  . Marital status: Married    Spouse name: Not on file  . Number of children: Not on file  . Years of education: Not on file  . Highest education level: Not on file  Occupational History  . Occupation: Surveyor, quantity: [employer]  . Financial resource strain: Not on file  . Food insecurity    Worry: Not on file    Inability: Not on file  . Transportation needs    Medical: Not on file    Non-medical: Not on file  Tobacco Use  . Smoking status: Never  Smoker  . Smokeless tobacco: Never Used  Substance and Sexual Activity  . Alcohol use: No  . Drug use: No  . Sexual activity: Not on file  Lifestyle  . Physical activity    Days per week: Not on file    Minutes per session: Not on file  . Stress: Not on file  Relationships  . Social Herbalist on phone: Not on file    Gets together: Not on file    Attends religious service: Not on file    Active member of club or organization: Not on file    Attends meetings of clubs or organizations: Not on file    Relationship status: Not on file  . Intimate partner violence    Fear of current or ex partner: Not on file    Emotionally abused: Not on file    Physically abused: Not on file    Forced sexual activity: Not on file  Other Topics Concern  . Not on file  Social History Narrative   Exercise calisthenics    Review of Systems  Constitutional: Positive for fever.  HENT: Negative.  Negative for congestion and sore throat.   Respiratory: Negative.  Negative for cough and shortness of breath.   Cardiovascular: Negative for chest pain and palpitations.  Gastrointestinal: Positive for diarrhea.  Genitourinary: Negative.  Negative for dysuria.  Musculoskeletal: Negative.  Negative for myalgias.  Skin: Negative.  Negative for rash.  Neurological: Negative for dizziness and headaches.  All other systems reviewed and are negative.   Objective   Vitals as reported by the patient: Today's Vitals   12/20/18 1643  Weight: 284 lb (128.8 kg)  Height: 5' 9"  (1.753 m)  Alert and oriented x3 in no apparent respiratory distress.  There are no diagnoses linked to this encounter.  Diagnoses and all orders for this visit:  Diarrhea of presumed infectious origin  Viral illness    Clinically stable and rapidly improving.  No red flag signs or symptoms.  No medical concerns identified during this visit. Diarrhea instructions given.  ED precautions given. Contact the office if no  better in the next 48 hours.  I discussed the assessment and treatment plan with the patient. The patient was provided an opportunity to ask questions and all were answered. The patient agreed with the plan and demonstrated an understanding of the instructions.   The patient was advised to call back or seek an in-person evaluation if the symptoms worsen or if the condition fails to improve as anticipated.  I provided 15 minutes of non-face-to-face time during this encounter.  Horald Pollen, MD  Primary Care at [facility]

## 2019-03-01 ENCOUNTER — Encounter: Payer: Self-pay | Admitting: Emergency Medicine

## 2019-03-01 ENCOUNTER — Other Ambulatory Visit: Payer: Self-pay

## 2019-03-01 ENCOUNTER — Ambulatory Visit (INDEPENDENT_AMBULATORY_CARE_PROVIDER_SITE_OTHER): Payer: 59 | Admitting: Emergency Medicine

## 2019-03-01 VITALS — BP 130/81 | HR 95 | Temp 98.1°F | Resp 16 | Ht 69.0 in | Wt 297.2 lb

## 2019-03-01 DIAGNOSIS — R399 Unspecified symptoms and signs involving the genitourinary system: Secondary | ICD-10-CM

## 2019-03-01 DIAGNOSIS — Z8639 Personal history of other endocrine, nutritional and metabolic disease: Secondary | ICD-10-CM

## 2019-03-01 DIAGNOSIS — Z8739 Personal history of other diseases of the musculoskeletal system and connective tissue: Secondary | ICD-10-CM | POA: Diagnosis not present

## 2019-03-01 DIAGNOSIS — Z0001 Encounter for general adult medical examination with abnormal findings: Secondary | ICD-10-CM | POA: Diagnosis not present

## 2019-03-01 DIAGNOSIS — Z130 Encounter for screening for diseases of the blood and blood-forming organs and certain disorders involving the immune mechanism: Secondary | ICD-10-CM

## 2019-03-01 DIAGNOSIS — Z23 Encounter for immunization: Secondary | ICD-10-CM

## 2019-03-01 DIAGNOSIS — Z13220 Encounter for screening for lipoid disorders: Secondary | ICD-10-CM

## 2019-03-01 DIAGNOSIS — Z125 Encounter for screening for malignant neoplasm of prostate: Secondary | ICD-10-CM

## 2019-03-01 DIAGNOSIS — G8929 Other chronic pain: Secondary | ICD-10-CM

## 2019-03-01 DIAGNOSIS — M25552 Pain in left hip: Secondary | ICD-10-CM | POA: Diagnosis not present

## 2019-03-01 NOTE — Progress Notes (Addendum)
Donald [name] 58 y.o.   Chief Complaint  Patient presents with  . Annual Exam    HISTORY OF PRESENT ILLNESS: This is a 58 y.o. male here for his annual exam.  Has a history of well-controlled diabetes.  On Metformin twice a day. Also has a history of osteoarthritis and chronic left hip pain.  Has seen orthopedist in the past. Complaining of lower urinary tract symptoms. No other complaints or medical concerns today. Health maintenance reviewed with patient.  HPI   Prior to Admission medications   Medication Sig Start Date End Date Taking? Authorizing Provider  aspirin 81 MG tablet Take 81 mg by mouth daily.     Yes [provider]  Aspirin-Salicylamide-Caffeine (BC FAST PAIN RELIEF) 650-195-33.3 MG PACK Take 1 Package by mouth every 8 (eight) hours as needed (pain).   Yes [provider]  cholecalciferol (VITAMIN D) 1000 UNITS tablet Take 1,000 Units by mouth daily.   Yes [provider]  ibuprofen (ADVIL,MOTRIN) 800 MG tablet Take 1 tablet (800 mg total) by mouth 3 (three) times daily. 07/27/17  Yes Larene Pickett, PA-C  MAGNESIUM PO Take by mouth.   Yes [provider]  metFORMIN (GLUCOPHAGE-XR) 500 MG 24 hr tablet TAKE 1 TABLET (500 MG TOTAL) BY MOUTH 2 (TWO) TIMES DAILY. 05/25/18  Yes [name], MD  Multiple Vitamin (MULTIVITAMIN) tablet Take 1 tablet by mouth daily.   Yes [provider]  naproxen (NAPROSYN) 500 MG tablet Take 1 tablet (500 mg total) by mouth 2 (two) times daily with a meal. 06/18/17  Yes [name], MD  blood glucose meter kit and supplies KIT Dispense based on patient and insurance preference. Use up to four times daily as directed. (FOR ICD-9 250.00, 250.01). 03/28/16   Alveda Reasons, MD  blood glucose meter kit and supplies Dispense based on patient and insurance preference. Use up to four times daily as directed. (FOR ICD-9 250.00, 250.01). 03/28/16   [name], MD  cyclobenzaprine (FLEXERIL) 10 MG tablet Take  0.5-1 tablets (5-10 mg total) by mouth 3 (three) times daily as needed for muscle spasms. Patient not taking: Reported on 03/01/2019 06/01/17   [name], MD  glucose blood (ONE TOUCH ULTRA TEST) test strip Use as directed 08/27/18   Forrest Moron, MD  traMADol (ULTRAM) 50 MG tablet Take 1-2 tablets (50-100 mg total) by mouth every 6 (six) hours as needed for moderate pain. Patient not taking: Reported on 03/01/2019 06/01/17   [name], MD    No Known Allergies  Patient Active Problem List   Diagnosis Date Noted  . Severe obesity (BMI >= 40) ([place]) 04/09/2013  . Type 2 diabetes mellitus ([place]) 04/01/2013  . Iron deficiency anemia, unspecified 11/21/2010    Past Medical History:  Diagnosis Date  . Anemia   . DM (diabetes mellitus) ([place])   . Hypertension   . Obesity   . Sleep apnea    on CPAP    Past Surgical History:  Procedure Laterality Date  . COLONOSCOPY  2009  . FOOT SURGERY      Social History   Socioeconomic History  . Marital status: Married    Spouse name: Not on file  . Number of children: Not on file  . Years of education: Not on file  . Highest education level: Not on file  Occupational History  . Occupation: Surveyor, quantity: [employer]  . Financial resource strain:  Not on file  . Food insecurity    Worry: Not on file    Inability: Not on file  . Transportation needs    Medical: Not on file    Non-medical: Not on file  Tobacco Use  . Smoking status: Never Smoker  . Smokeless tobacco: Never Used  Substance and Sexual Activity  . Alcohol use: No  . Drug use: No  . Sexual activity: Not on file  Lifestyle  . Physical activity    Days per week: Not on file    Minutes per session: Not on file  . Stress: Not on file  Relationships  . Social Herbalist on phone: Not on file    Gets together: Not on file    Attends religious service: Not on file    Active member of club or organization: Not on file    Attends meetings of  clubs or organizations: Not on file    Relationship status: Not on file  . Intimate partner violence    Fear of current or ex partner: Not on file    Emotionally abused: Not on file    Physically abused: Not on file    Forced sexual activity: Not on file  Other Topics Concern  . Not on file  Social History Narrative   Exercise calisthenics    Family History  Problem Relation Age of Onset  . Diabetes Sister 59  . Colon cancer Neg Hx      Review of Systems  Constitutional: Negative.  Negative for chills and fever.  HENT: Negative.  Negative for congestion and sore throat.   Eyes: Negative.   Respiratory: Negative.  Negative for shortness of breath.   Cardiovascular: Negative.  Negative for chest pain and palpitations.  Gastrointestinal: Negative.  Negative for abdominal pain, blood in stool, diarrhea, melena, nausea and vomiting.  Genitourinary: Positive for frequency. Negative for hematuria.       Nocturia and frequency.  Musculoskeletal: Positive for joint pain (Chronic left hip pain).  Skin: Negative.  Negative for rash.  Neurological: Negative for dizziness and headaches.  All other systems reviewed and are negative.  Vitals:   03/01/19 1020  BP: 130/81  Pulse: 95  Resp: 16  Temp: 98.1 F (36.7 C)  SpO2: 95%     Physical Exam Vitals signs reviewed.  Constitutional:      Appearance: Normal appearance. He is obese.  HENT:     Head: Normocephalic.  Eyes:     Extraocular Movements: Extraocular movements intact.     Conjunctiva/sclera: Conjunctivae normal.     Pupils: Pupils are equal, round, and reactive to light.  Neck:     Musculoskeletal: Normal range of motion and neck supple.  Cardiovascular:     Rate and Rhythm: Normal rate and regular rhythm.     Pulses: Normal pulses.     Heart sounds: Normal heart sounds.  Pulmonary:     Effort: Pulmonary effort is normal.     Breath sounds: Normal breath sounds.  Abdominal:     General: There is no distension.      Palpations: Abdomen is soft. There is no mass.     Tenderness: There is no abdominal tenderness.  Genitourinary:    Prostate: Normal. Not tender and no nodules present.     Rectum: Normal. Guaiac result negative. No mass or external hemorrhoid.  Musculoskeletal:        General: No swelling or tenderness.     Right lower leg:  No edema.     Left lower leg: No edema.  Skin:    General: Skin is warm and dry.     Capillary Refill: Capillary refill takes less than 2 seconds.  Neurological:     General: No focal deficit present.     Mental Status: He is alert and oriented to person, place, and time.  Psychiatric:        Mood and Affect: Mood normal.        Behavior: Behavior normal.      ASSESSMENT & PLAN: Jeric was seen today for annual exam.  Diagnoses and all orders for this visit:  Encounter for general adult medical examination with abnormal findings  History of diabetes mellitus, type II -     CBC with Differential/Platelet -     Comprehensive metabolic panel -     Lipid panel -     Microalbumin, urine -     Hemoglobin A1c -     HM Diabetes Foot Exam  Prostate cancer screening -     PSA(Must document that pt has been informed of limitations of PSA testing.)  Lower urinary tract symptoms (LUTS) -     Ambulatory referral to Urology  Chronic left hip pain  History of osteoarthritis  Screening for deficiency anemia  Screening for lipoid disorders  Need for prophylactic vaccination and inoculation against influenza  Other orders -     Flu Vaccine QUAD 36+ mos IM    Patient Instructions       If you have lab work done today you will be contacted with your lab results within the next 2 weeks.  If you have not heard from [place] then please contact us. The fastest way to get your results is to register for My Chart.   IF you received an x-ray today, you will receive an invoice from [facility]. Please contact [facility] at [phone] with  questions or concerns regarding your invoice.   IF you received labwork today, you will receive an invoice from [place]. Please contact [facility] at [phone] with questions or concerns regarding your invoice.   Our billing staff will not be able to assist you with questions regarding bills from these companies.  You will be contacted with the lab results as soon as they are available. The fastest way to get your results is to activate your My Chart account. Instructions are located on the last page of this paperwork. If you have not heard from [place] regarding the results in 2 weeks, please contact this office.      Health Maintenance, Male Adopting a healthy lifestyle and getting preventive care are important in promoting health and wellness. Ask your health care provider about:  The right schedule for you to have regular tests and exams.  Things you can do on your own to prevent diseases and keep yourself healthy. What should I know about diet, weight, and exercise? Eat a healthy diet   Eat a diet that includes plenty of vegetables, fruits, low-fat dairy products, and lean protein.  Do not eat a lot of foods that are high in solid fats, added sugars, or sodium. Maintain a healthy weight Body mass index (BMI) is a measurement that can be used to identify possible weight problems. It estimates body fat based on height and weight. Your health care provider can help determine your BMI and help you achieve or maintain a healthy weight. Get regular exercise Get regular exercise. This is one of the most  important things you can do for your health. Most adults should:  Exercise for at least 150 minutes each week. The exercise should increase your heart rate and make you sweat (moderate-intensity exercise).  Do strengthening exercises at least twice a week. This is in addition to the moderate-intensity exercise.  Spend less time sitting. Even light physical activity can be beneficial.  Watch cholesterol and blood lipids Have your blood tested for lipids and cholesterol at 59 years of age, then have this test every 5 years. You may need to have your cholesterol levels checked more often if:  Your lipid or cholesterol levels are high.  You are older than 59 years of age.  You are at high risk for heart disease. What should I know about cancer screening? Many types of cancers can be detected early and may often be prevented. Depending on your health history and family history, you may need to have cancer screening at various ages. This may include screening for:  Colorectal cancer.  Prostate cancer.  Skin cancer.  Lung cancer. What should I know about heart disease, diabetes, and high blood pressure? Blood pressure and heart disease  High blood pressure causes heart disease and increases the risk of stroke. This is more likely to develop in people who have high blood pressure readings, are of African descent, or are overweight.  Talk with your health care provider about your target blood pressure readings.  Have your blood pressure checked: ? Every 3-5 years if you are 61-5 years of age. ? Every year if you are 64 years old or older.  If you are between the ages of 85 and 66 and are a current or former smoker, ask your health care provider if you should have a one-time screening for abdominal aortic aneurysm (AAA). Diabetes Have regular diabetes screenings. This checks your fasting blood sugar level. Have the screening done:  Once every three years after age 39 if you are at a normal weight and have a low risk for diabetes.  More often and at a younger age if you are overweight or have a high risk for diabetes. What should I know about preventing infection? Hepatitis B If you have a higher risk for hepatitis B, you should be screened for this virus. Talk with your health care provider to find out if you are at risk for hepatitis B infection. Hepatitis C  Blood testing is recommended for:  Everyone born from [birth year] through 1966.  Anyone with known risk factors for hepatitis C. Sexually transmitted infections (STIs)  You should be screened each year for STIs, including gonorrhea and chlamydia, if: ? You are sexually active and are younger than 59 years of age. ? You are older than 58 years of age and your health care provider tells you that you are at risk for this type of infection. ? Your sexual activity has changed since you were last screened, and you are at increased risk for chlamydia or gonorrhea. Ask your health care provider if you are at risk.  Ask your health care provider about whether you are at high risk for HIV. Your health care provider may recommend a prescription medicine to help prevent HIV infection. If you choose to take medicine to prevent HIV, you should first get tested for HIV. You should then be tested every 3 months for as long as you are taking the medicine. Follow these instructions at home: Lifestyle  Do not use any products that contain nicotine or tobacco, such  as cigarettes, e-cigarettes, and chewing tobacco. If you need help quitting, ask your health care provider.  Do not use street drugs.  Do not share needles.  Ask your health care provider for help if you need support or information about quitting drugs. Alcohol use  Do not drink alcohol if your health care provider tells you not to drink.  If you drink alcohol: ? Limit how much you have to 0-2 drinks a day. ? Be aware of how much alcohol is in your drink. In the [place] one drink equals one 12 oz bottle of beer (355 mL), one 5 oz glass of wine (148 mL), or one 1 oz glass of hard liquor (44 mL). General instructions  Schedule regular health, dental, and eye exams.  Stay current with your vaccines.  Tell your health care provider if: ? You often feel depressed. ? You have ever been abused or do not feel safe at home. Summary  Adopting a  healthy lifestyle and getting preventive care are important in promoting health and wellness.  Follow your health care provider's instructions about healthy diet, exercising, and getting tested or screened for diseases.  Follow your health care provider's instructions on monitoring your cholesterol and blood pressure. This information is not intended to replace advice given to you by your health care provider. Make sure you discuss any questions you have with your health care provider. Document Released: 10/25/2007 Document Revised: 04/21/2018 Document Reviewed: 04/21/2018 Elsevier Patient Education  2020 Elsevier Inc.      Agustina Caroli, MD [facility]

## 2019-03-01 NOTE — Patient Instructions (Addendum)
   If you have lab work done today you will be contacted with your lab results within the next 2 weeks.  If you have not heard from us then please contact us. The fastest way to get your results is to register for My Chart.   IF you received an x-ray today, you will receive an invoice from [facility]. Please contact [facility] at [phone] with questions or concerns regarding your invoice.   IF you received labwork today, you will receive an invoice from [facility]. Please contact [facility] at [phone] with questions or concerns regarding your invoice.   Our billing staff will not be able to assist you with questions regarding bills from these companies.  You will be contacted with the lab results as soon as they are available. The fastest way to get your results is to activate your My Chart account. Instructions are located on the last page of this paperwork. If you have not heard from us regarding the results in 2 weeks, please contact this office.     Health Maintenance, Male Adopting a healthy lifestyle and getting preventive care are important in promoting health and wellness. Ask your health care provider about:  The right schedule for you to have regular tests and exams.  Things you can do on your own to prevent diseases and keep yourself healthy. What should I know about diet, weight, and exercise? Eat a healthy diet   Eat a diet that includes plenty of vegetables, fruits, low-fat dairy products, and lean protein.  Do not eat a lot of foods that are high in solid fats, added sugars, or sodium. Maintain a healthy weight Body mass index (BMI) is a measurement that can be used to identify possible weight problems. It estimates body fat based on height and weight. Your health care provider can help determine your BMI and help you achieve or maintain a healthy weight. Get regular exercise Get regular exercise. This is one of the most important things you  can do for your health. Most adults should:  Exercise for at least 150 minutes each week. The exercise should increase your heart rate and make you sweat (moderate-intensity exercise).  Do strengthening exercises at least twice a week. This is in addition to the moderate-intensity exercise.  Spend less time sitting. Even light physical activity can be beneficial. Watch cholesterol and blood lipids Have your blood tested for lipids and cholesterol at 59 years of age, then have this test every 5 years. You may need to have your cholesterol levels checked more often if:  Your lipid or cholesterol levels are high.  You are older than 59 years of age.  You are at high risk for heart disease. What should I know about cancer screening? Many types of cancers can be detected early and may often be prevented. Depending on your health history and family history, you may need to have cancer screening at various ages. This may include screening for:  Colorectal cancer.  Prostate cancer.  Skin cancer.  Lung cancer. What should I know about heart disease, diabetes, and high blood pressure? Blood pressure and heart disease  High blood pressure causes heart disease and increases the risk of stroke. This is more likely to develop in people who have high blood pressure readings, are of African descent, or are overweight.  Talk with your health care provider about your target blood pressure readings.  Have your blood pressure checked: ? Every 3-5 years if you are 18-39 years   of age. ? Every year if you are 40 years old or older.  If you are between the ages of 65 and 75 and are a current or former smoker, ask your health care provider if you should have a one-time screening for abdominal aortic aneurysm (AAA). Diabetes Have regular diabetes screenings. This checks your fasting blood sugar level. Have the screening done:  Once every three years after age 45 if you are at a normal weight and have  a low risk for diabetes.  More often and at a younger age if you are overweight or have a high risk for diabetes. What should I know about preventing infection? Hepatitis B If you have a higher risk for hepatitis B, you should be screened for this virus. Talk with your health care provider to find out if you are at risk for hepatitis B infection. Hepatitis C Blood testing is recommended for:  Everyone born from 1945 through 1965.  Anyone with known risk factors for hepatitis C. Sexually transmitted infections (STIs)  You should be screened each year for STIs, including gonorrhea and chlamydia, if: ? You are sexually active and are younger than 59 years of age. ? You are older than 59 years of age and your health care provider tells you that you are at risk for this type of infection. ? Your sexual activity has changed since you were last screened, and you are at increased risk for chlamydia or gonorrhea. Ask your health care provider if you are at risk.  Ask your health care provider about whether you are at high risk for HIV. Your health care provider may recommend a prescription medicine to help prevent HIV infection. If you choose to take medicine to prevent HIV, you should first get tested for HIV. You should then be tested every 3 months for as long as you are taking the medicine. Follow these instructions at home: Lifestyle  Do not use any products that contain nicotine or tobacco, such as cigarettes, e-cigarettes, and chewing tobacco. If you need help quitting, ask your health care provider.  Do not use street drugs.  Do not share needles.  Ask your health care provider for help if you need support or information about quitting drugs. Alcohol use  Do not drink alcohol if your health care provider tells you not to drink.  If you drink alcohol: ? Limit how much you have to 0-2 drinks a day. ? Be aware of how much alcohol is in your drink. In the [place] one drink equals one 12  oz bottle of beer (355 mL), one 5 oz glass of wine (148 mL), or one 1 oz glass of hard liquor (44 mL). General instructions  Schedule regular health, dental, and eye exams.  Stay current with your vaccines.  Tell your health care provider if: ? You often feel depressed. ? You have ever been abused or do not feel safe at home. Summary  Adopting a healthy lifestyle and getting preventive care are important in promoting health and wellness.  Follow your health care provider's instructions about healthy diet, exercising, and getting tested or screened for diseases.  Follow your health care provider's instructions on monitoring your cholesterol and blood pressure. This information is not intended to replace advice given to you by your health care provider. Make sure you discuss any questions you have with your health care provider. Document Released: 10/25/2007 Document Revised: 04/21/2018 Document Reviewed: 04/21/2018 Elsevier Patient Education  2020 Elsevier Inc.  

## 2019-03-02 ENCOUNTER — Other Ambulatory Visit: Payer: Self-pay | Admitting: Emergency Medicine

## 2019-03-02 DIAGNOSIS — Z8639 Personal history of other endocrine, nutritional and metabolic disease: Secondary | ICD-10-CM

## 2019-03-02 LAB — CBC WITH DIFFERENTIAL/PLATELET
Basophils Absolute: 0 10*3/uL (ref 0.0–0.2)
Basos: 1 %
EOS (ABSOLUTE): 0.1 10*3/uL (ref 0.0–0.4)
Eos: 2 %
Hematocrit: 42.4 % (ref 37.5–51.0)
Hemoglobin: 13.3 g/dL (ref 13.0–17.7)
Immature Grans (Abs): 0 10*3/uL (ref 0.0–0.1)
Immature Granulocytes: 0 %
Lymphocytes Absolute: 1.8 10*3/uL (ref 0.7–3.1)
Lymphs: 27 %
MCH: 25.1 pg — ABNORMAL LOW (ref 26.6–33.0)
MCHC: 31.4 g/dL — ABNORMAL LOW (ref 31.5–35.7)
MCV: 80 fL (ref 79–97)
Monocytes Absolute: 0.7 10*3/uL (ref 0.1–0.9)
Monocytes: 10 %
Neutrophils Absolute: 4.1 10*3/uL (ref 1.4–7.0)
Neutrophils: 60 %
Platelets: 260 10*3/uL (ref 150–450)
RBC: 5.29 x10E6/uL (ref 4.14–5.80)
RDW: 14 % (ref 11.6–15.4)
WBC: 6.7 10*3/uL (ref 3.4–10.8)

## 2019-03-02 LAB — COMPREHENSIVE METABOLIC PANEL
ALT: 32 IU/L (ref 0–44)
AST: 25 IU/L (ref 0–40)
Albumin/Globulin Ratio: 1.2 (ref 1.2–2.2)
Albumin: 4.2 g/dL (ref 3.8–4.9)
Alkaline Phosphatase: 58 IU/L (ref 39–117)
BUN/Creatinine Ratio: 16 (ref 9–20)
BUN: 10 mg/dL (ref 6–24)
Bilirubin Total: 0.7 mg/dL (ref 0.0–1.2)
CO2: 22 mmol/L (ref 20–29)
Calcium: 9.3 mg/dL (ref 8.7–10.2)
Chloride: 102 mmol/L (ref 96–106)
Creatinine, Ser: 0.63 mg/dL — ABNORMAL LOW (ref 0.76–1.27)
GFR calc Af Amer: 125 mL/min/{1.73_m2} (ref >59)
GFR calc non Af Amer: 108 mL/min/{1.73_m2} (ref >59)
Globulin, Total: 3.5 g/dL (ref 1.5–4.5)
Glucose: 107 mg/dL — ABNORMAL HIGH (ref 65–99)
Potassium: 4.4 mmol/L (ref 3.5–5.2)
Sodium: 137 mmol/L (ref 134–144)
Total Protein: 7.7 g/dL (ref 6.0–8.5)

## 2019-03-02 LAB — MICROALBUMIN, URINE: Microalbumin, Urine: 9.7 ug/mL

## 2019-03-02 LAB — LIPID PANEL
Chol/HDL Ratio: 4.4 ratio (ref 0.0–5.0)
Cholesterol, Total: 146 mg/dL (ref 100–199)
HDL: 33 mg/dL — ABNORMAL LOW (ref >39)
LDL Chol Calc (NIH): 96 mg/dL (ref 0–99)
Triglycerides: 86 mg/dL (ref 0–149)
VLDL Cholesterol Cal: 17 mg/dL (ref 5–40)

## 2019-03-02 LAB — HEMOGLOBIN A1C
Est. average glucose Bld gHb Est-mCnc: 137 mg/dL
Hgb A1c MFr Bld: 6.4 % — ABNORMAL HIGH (ref 4.8–5.6)

## 2019-03-02 LAB — PSA: Prostate Specific Ag, Serum: 1.6 ng/mL (ref 0.0–4.0)

## 2019-03-02 MED ORDER — ROSUVASTATIN CALCIUM 5 MG PO TABS: 5.0000 mg | ORAL_TABLET | Freq: Every day | ORAL | 3 refills | Status: DC

## 2019-04-12 ENCOUNTER — Other Ambulatory Visit: Payer: Self-pay | Admitting: Emergency Medicine

## 2019-04-13 NOTE — Telephone Encounter (Signed)
Requested medication (s) are due for refill today: yes  Requested medication (s) are on the active medication list: yes  Last refill:  08/14/2018  Future visit scheduled: no  Notes to clinic:  Written by Dr [name]    Requested Prescriptions  Pending Prescriptions Disp Refills   metFORMIN (GLUCOPHAGE-XR) 500 MG 24 hr tablet [Pharmacy Med Name: METFORMIN HCL ER 500 MG TABLET] 180 tablet 1    Sig: TAKE 1 TABLET BY [facility]     Endocrinology:  Diabetes - Biguanides Failed - 04/12/2019  6:27 PM      Failed - Cr in normal range and within 360 days    Creat  Date Value Ref Range Status  05/12/2015 0.63 (L) 0.70 - 1.33 mg/dL Final   Creatinine, Ser  Date Value Ref Range Status  03/01/2019 0.63 (L) 0.76 - 1.27 mg/dL Final         Passed - HBA1C is between 0 and 7.9 and within 180 days    Hgb A1c MFr Bld  Date Value Ref Range Status  03/01/2019 6.4 (H) 4.8 - 5.6 % Final    Comment:             Prediabetes: 5.7 - 6.4          Diabetes: >6.4          Glycemic control for adults with diabetes: <7.0          Passed - eGFR in normal range and within 360 days    GFR, Est African American  Date Value Ref Range Status  05/12/2015 >89 >=60 mL/min Final   GFR calc Af Amer  Date Value Ref Range Status  03/01/2019 125 >59 mL/min/1.73 Final   GFR, Est Non African American  Date Value Ref Range Status  05/12/2015 >89 >=60 mL/min Final    Comment:      The estimated GFR is a calculation valid for adults (>=3 years old) that uses the CKD-EPI algorithm to adjust for age and sex. It is   not to be used for children, pregnant women, hospitalized patients,    patients on dialysis, or with rapidly changing kidney function. According to the NKDEP, eGFR >89 is normal, 60-89 shows mild impairment, 30-59 shows moderate impairment, 15-29 shows severe impairment and <15 is ESRD.      GFR calc non Af Amer  Date Value Ref Range Status  03/01/2019 108 >59 mL/min/1.73 Final          Passed - Valid encounter within last 6 months    Recent Outpatient Visits          1 month ago Encounter for general adult medical examination with abnormal findings   [facility] at [facility], [place], MD   3 months ago Diarrhea of presumed infectious origin   [facility] at [place], Ines Bloomer, MD   1 year ago Routine general medical examination at a health care facility   [facility] [place], [place], MD   1 year ago Left-sided low back pain without sciatica, unspecified chronicity   [facility], Laurey Arrow, MD   2 years ago Left hip pain   [facility], Ranell Patrick, MD

## 2019-07-05 ENCOUNTER — Ambulatory Visit: Payer: 59 | Admitting: Family Medicine

## 2019-07-05 ENCOUNTER — Other Ambulatory Visit: Payer: Self-pay

## 2019-07-05 ENCOUNTER — Encounter: Payer: Self-pay | Admitting: Family Medicine

## 2019-07-05 VITALS — BP 116/72 | HR 100 | Wt 300.0 lb

## 2019-07-05 DIAGNOSIS — G4730 Sleep apnea, unspecified: Secondary | ICD-10-CM | POA: Insufficient documentation

## 2019-07-05 DIAGNOSIS — E1169 Type 2 diabetes mellitus with other specified complication: Secondary | ICD-10-CM

## 2019-07-05 DIAGNOSIS — M1612 Unilateral primary osteoarthritis, left hip: Secondary | ICD-10-CM

## 2019-07-05 DIAGNOSIS — Z0000 Encounter for general adult medical examination without abnormal findings: Secondary | ICD-10-CM

## 2019-07-05 DIAGNOSIS — G4733 Obstructive sleep apnea (adult) (pediatric): Secondary | ICD-10-CM

## 2019-07-05 DIAGNOSIS — M25552 Pain in left hip: Secondary | ICD-10-CM

## 2019-07-05 LAB — POCT GLYCOSYLATED HEMOGLOBIN (HGB A1C): HbA1c, POC (controlled diabetic range): 6.3 % (ref 0.0–7.0)

## 2019-07-05 NOTE — Assessment & Plan Note (Signed)
Patient reports longstanding history of obstructive sleep apnea.  He says that he has a CPAP but that it no longer fits and he needs refitting.  He says that his last sleep evaluation was more than 10 years ago and that his CPAP is also greater than 60 years old. -Referral sent to pulmonology for sleep studies to evaluate and refit

## 2019-07-05 NOTE — Progress Notes (Signed)
    SUBJECTIVE:   CHIEF COMPLAINT / HPI:  New patient visit Current concerns include arthritic pain in left hip. Last shot was in March 2019.   Weight concerns. Decreasing sugar intake. Drinks 3 sodas a day. 24 oz sodas. Would like to speak with dietician.   [place] Arthritis-2017 Diabetes-2015 Sleep apnea-2014 needs new CPAP   Past surgical history Foot surgery in 2000  Family history: Diabetes sister  Hypertension in father  Allergies No known drug allergies  Social Married with 3 step children and 6 grandchildren. Does not smoke tobaco never has. One beer every 2 months and occasional mixed drink. No other illicit substances. No marijuana. Sexually active with wife.   Routine health maintenance Last colonoscopy was in 2009 and was negative. Due for one.   OBJECTIVE:   BP 116/72   Pulse 100   Wt 300 lb (136.1 kg)   SpO2 98%   BMI 44.30 kg/m   General: NAD, obese, sitting comfortably in chair.  Has difficulty ambulating from chair to exam table due to hip pain HEENT: Atraumatic. Normocephalic. Normal oropharynx without erythema, lesions, exudate.  Neck: No cervical lymphadenopathy.  Cardiac: RRR, no m/r/g Respiratory: CTAB, normal work of breathing Abdomen: soft, nontender, nondistended, bowel sounds normal Skin: warm and dry, no rashes noted Neuro: alert and oriented MSK: Difficulty with flexion and extension of the left hip.  No tenderness to palpation of hip.  No issues with flexion extension of knees.   ASSESSMENT/PLAN:   Type 2 diabetes mellitus (HCC) Hemoglobin A1c today is 6.3 from 6.4 at last check in October 2020.  Patient currently on Metformin 500 mg XR twice daily -Patient requests trying 1000 mg once daily to help with his medication compliance.  Patient will take both 500 mg in the morning. -Follow-up in 3 months for repeat hemoglobin A1c check -Referral sent to dietitians to help with weight loss and diabetes control  Severe obesity (BMI >=  40) Patient weight today was 300 pounds.  His BMI is 44.3.  Patient has desire to lose weight.  He reports he has been trying to make dietary changes but has issues with decreasing his soda intake.  Here for reports 324 ounce sodas daily. -Referral sent for dietitian consult -Encouraged to decrease soda intake  Osteoarthritis of left hip Patient reports long history of osteoarthritis of the left hip.  He reports having "several" injections in the left hip that were done at [facility] radiology.  He reports some improvement with the injections but it has been several years since his last injection.  Patient is interested in meeting with orthopedics out of concern that he may need hip replacement. -Encouraged weight loss using dietary changes and exercise -Referral sent to orthopedics for evaluation -We will follow orthopedics recommendations  Routine health maintenance Patient reports last colonoscopy was over 10 years ago. -Referral sent to GI for colonoscopy scheduling  Sleep apnea Patient reports longstanding history of obstructive sleep apnea.  He says that he has a CPAP but that it no longer fits and he needs refitting.  He says that his last sleep evaluation was more than 10 years ago and that his CPAP is also greater than 35 years old. -Referral sent to pulmonology for sleep studies to evaluate and refit   Gifford Shave, MD [place]

## 2019-07-05 NOTE — Patient Instructions (Addendum)
It was a pleasure to meet you today.  Regarding concerns with your arthritic hip pain I am going to refer you to orthopedics.  They will be contacting you to schedule an appointment.   I know we were planning on switching your Metformin to 1000 mg in the morning. It looks like you are already prescribed 500 mg XR twice daily. Try taking both of those at one time in the morning and see if he has issues with stomach problems.   Regarding her weight concerns I am going to put a referral in for our dietitian at this office.  I have also given you the number to call.  You call this number and schedule an appointment with Dr. Jenne Campus.  I would like you to try to decrease your soda intake and you can replace it with water.  I have also put in a referral to pulmonology for a sleep specialist.  They can help [place] that she with a new CPAP.  And lastly I put in a referral to gastroenterology for your regular schedule colonoscopy.  They will call regarding when to schedule the. If you have any questions or concerns that come up please feel free to call the clinic and schedule an appointment. Have a wonderful day!

## 2019-07-05 NOTE — Assessment & Plan Note (Signed)
Patient reports long history of osteoarthritis of the left hip.  He reports having "several" injections in the left hip that were done at [facility] radiology.  He reports some improvement with the injections but it has been several years since his last injection.  Patient is interested in meeting with orthopedics out of concern that he may need hip replacement. -Encouraged weight loss using dietary changes and exercise -Referral sent to orthopedics for evaluation -We will follow orthopedics recommendations

## 2019-07-05 NOTE — Assessment & Plan Note (Signed)
Patient reports last colonoscopy was over 10 years ago. -Referral sent to GI for colonoscopy scheduling

## 2019-07-05 NOTE — Assessment & Plan Note (Signed)
Patient weight today was 300 pounds.  His BMI is 44.3.  Patient has desire to lose weight.  He reports he has been trying to make dietary changes but has issues with decreasing his soda intake.  Here for reports 324 ounce sodas daily. -Referral sent for dietitian consult -Encouraged to decrease soda intake

## 2019-07-05 NOTE — Assessment & Plan Note (Signed)
Hemoglobin A1c today is 6.3 from 6.4 at last check in October 2020.  Patient currently on Metformin 500 mg XR twice daily -Patient requests trying 1000 mg once daily to help with his medication compliance.  Patient will take both 500 mg in the morning. -Follow-up in 3 months for repeat hemoglobin A1c check -Referral sent to dietitians to help with weight loss and diabetes control

## 2019-07-06 ENCOUNTER — Encounter: Payer: Self-pay | Admitting: Gastroenterology

## 2019-08-29 ENCOUNTER — Encounter: Payer: 59 | Admitting: Gastroenterology

## 2019-09-07 ENCOUNTER — Other Ambulatory Visit: Payer: Self-pay

## 2019-09-07 ENCOUNTER — Ambulatory Visit (AMBULATORY_SURGERY_CENTER): Payer: Self-pay

## 2019-09-07 VITALS — Temp 96.5°F | Ht 69.0 in | Wt 288.0 lb

## 2019-09-07 DIAGNOSIS — Z1211 Encounter for screening for malignant neoplasm of colon: Secondary | ICD-10-CM

## 2019-09-07 DIAGNOSIS — Z01818 Encounter for other preprocedural examination: Secondary | ICD-10-CM

## 2019-09-07 MED ORDER — NA SULFATE-K SULFATE-MG SULF 17.5-3.13-1.6 GM/177ML PO SOLN
1.0000 | Freq: Once | ORAL | 0 refills | Status: AC
Start: 2019-09-07 — End: 2019-09-07

## 2019-09-07 NOTE — Progress Notes (Signed)
No allergies to soy or egg Pt is not on blood thinners or diet pills Denies issues with sedation/intubation Denies atrial flutter/fib Denies constipation   Emmi instructions given to pt  Pt is aware of Covid safety and care partner requirements.  

## 2019-09-16 ENCOUNTER — Encounter: Payer: Self-pay | Admitting: Gastroenterology

## 2019-09-16 ENCOUNTER — Ambulatory Visit (INDEPENDENT_AMBULATORY_CARE_PROVIDER_SITE_OTHER): Payer: 59

## 2019-09-16 ENCOUNTER — Other Ambulatory Visit: Payer: Self-pay | Admitting: Gastroenterology

## 2019-09-16 ENCOUNTER — Other Ambulatory Visit: Payer: Self-pay

## 2019-09-16 DIAGNOSIS — Z1159 Encounter for screening for other viral diseases: Secondary | ICD-10-CM

## 2019-09-17 LAB — SARS CORONAVIRUS 2 (TAT 6-24 HRS): SARS Coronavirus 2: NEGATIVE

## 2019-09-21 ENCOUNTER — Other Ambulatory Visit: Payer: Self-pay

## 2019-09-21 ENCOUNTER — Encounter: Payer: Self-pay | Admitting: Gastroenterology

## 2019-09-21 ENCOUNTER — Ambulatory Visit (AMBULATORY_SURGERY_CENTER): Payer: 59 | Admitting: Gastroenterology

## 2019-09-21 VITALS — BP 140/91 | HR 75 | Temp 96.6°F | Resp 12 | Ht 69.0 in | Wt 288.0 lb

## 2019-09-21 DIAGNOSIS — D127 Benign neoplasm of rectosigmoid junction: Secondary | ICD-10-CM

## 2019-09-21 DIAGNOSIS — Z1211 Encounter for screening for malignant neoplasm of colon: Secondary | ICD-10-CM

## 2019-09-21 DIAGNOSIS — D123 Benign neoplasm of transverse colon: Secondary | ICD-10-CM | POA: Diagnosis not present

## 2019-09-21 MED ORDER — SODIUM CHLORIDE 0.9 % IV SOLN: 500.0000 mL | INTRAVENOUS | Status: DC

## 2019-09-21 NOTE — Op Note (Addendum)
Donald [name]: Donald [name] Procedure Date: 09/21/2019 11:28 AM MRN: [id number] Endoscopist: Remo Lipps P. Havery Moros , MD Age: 59 Referring MD:  Date of Birth: [birth year] Gender: Male Account #: [id number] Procedure:                Colonoscopy Indications:              Screening for colorectal malignant neoplasm Medicines:                Monitored Anesthesia Care Procedure:                Pre-Anesthesia Assessment:                           - Prior to the procedure, a History and Physical                            was performed, and patient medications and                            allergies were reviewed. The patient's tolerance of                            previous anesthesia was also reviewed. The risks                            and benefits of the procedure and the sedation                            options and risks were discussed with the patient.                            All questions were answered, and informed consent                            was obtained. Prior Anticoagulants: The patient has                            taken no previous anticoagulant or antiplatelet                            agents. ASA Grade Assessment: III - A patient with                            severe systemic disease. After reviewing the risks                            and benefits, the patient was deemed in                            satisfactory condition to undergo the procedure.                           After obtaining informed consent, the colonoscope  was passed under direct vision. Throughout the                            procedure, the patient's blood pressure, pulse, and                            oxygen saturations were monitored continuously. The                            Colonoscope was introduced through the anus and                            advanced to the the cecum, identified by                            appendiceal orifice and  ileocecal valve. The                            colonoscopy was performed without difficulty. The                            patient tolerated the procedure well. The quality                            of the bowel preparation was good. The ileocecal                            valve, appendiceal orifice, and rectum were                            photographed. Scope In: 11:35:24 AM Scope Out: [id number] AM Scope Withdrawal Time: 0 hours 19 minutes 7 seconds  Total Procedure Duration: 0 hours 21 minutes 19 seconds  Findings:                 The perianal and digital rectal examinations were                            normal.                           A 4 mm polyp was found in the splenic flexure. The                            polyp was sessile. The polyp was removed with a                            cold snare. Resection and retrieval were complete.                           A 3 mm polyp was found in the recto-sigmoid colon.                            The polyp was sessile. The polyp was removed with a  cold snare. Resection and retrieval were complete.                           A few small-mouthed diverticula were found in the                            sigmoid colon.                           Internal hemorrhoids were found during                            retroflexion. The hemorrhoids were small.                           The exam was otherwise without abnormality. Complications:            No immediate complications. Estimated blood loss:                            Minimal. Estimated Blood Loss:     Estimated blood loss was minimal. Impression:               - One 4 mm polyp at the splenic flexure, removed                            with a cold snare. Resected and retrieved.                           - One 3 mm polyp at the recto-sigmoid colon,                            removed with a cold snare. Resected and retrieved.                           -  Diverticulosis in the sigmoid colon.                           - Internal hemorrhoids.                           - The examination was otherwise normal. Recommendation:           - Patient has a contact number available for                            emergencies. The signs and symptoms of potential                            delayed complications were discussed with the                            patient. Return to normal activities tomorrow.                            Written discharge instructions were provided to the  patient.                           - Resume previous diet.                           - Continue present medications.                           - Await pathology results. Remo Lipps P. Nakai Pollio, MD 09/21/2019 12:00:07 PM This report has been signed electronically.

## 2019-09-21 NOTE — Patient Instructions (Signed)
Discharge instructions given. Handouts on polyps,diverticulosis and hemorrhoids. Resume previous medications. YOU HAD AN ENDOSCOPIC PROCEDURE TODAY AT THE [facility]:   Refer to the procedure report that was given to you for any specific questions about what was found during the examination.  If the procedure report does not answer your questions, please call your gastroenterologist to clarify.  If you requested that your care partner not be given the details of your procedure findings, then the procedure report has been included in a sealed envelope for you to review at your convenience later.  YOU SHOULD EXPECT: Some feelings of bloating in the abdomen. Passage of more gas than usual.  Walking can help get rid of the air that was put into your GI tract during the procedure and reduce the bloating. If you had a lower endoscopy (such as a colonoscopy or flexible sigmoidoscopy) you may notice spotting of blood in your stool or on the toilet paper. If you underwent a bowel prep for your procedure, you may not have a normal bowel movement for a few days.  Please Note:  You might notice some irritation and congestion in your nose or some drainage.  This is from the oxygen used during your procedure.  There is no need for concern and it should clear up in a day or so.  SYMPTOMS TO REPORT IMMEDIATELY:   Following lower endoscopy (colonoscopy or flexible sigmoidoscopy):  Excessive amounts of blood in the stool  Significant tenderness or worsening of abdominal pains  Swelling of the abdomen that is new, acute  Fever of 100F or higher   For urgent or emergent issues, a gastroenterologist can be reached at any hour by calling [phone]. Do not use MyChart messaging for urgent concerns.    DIET:  We do recommend a small meal at first, but then you may proceed to your regular diet.  Drink plenty of fluids but you should avoid alcoholic beverages for 24 hours.  ACTIVITY:  You should  plan to take it easy for the rest of today and you should NOT DRIVE or use heavy machinery until tomorrow (because of the sedation medicines used during the test).    FOLLOW UP: Our staff will call the number listed on your records 48-72 hours following your procedure to check on you and address any questions or concerns that you may have regarding the information given to you following your procedure. If we do not reach you, we will leave a message.  We will attempt to reach you two times.  During this call, we will ask if you have developed any symptoms of COVID 19. If you develop any symptoms (ie: fever, flu-like symptoms, shortness of breath, cough etc.) before then, please call [phone].  If you test positive for Covid 19 in the 2 weeks post procedure, please call and report this information to us.    If any biopsies were taken you will be contacted by phone or by letter within the next 1-3 weeks.  Please call us at [phone] if you have not heard about the biopsies in 3 weeks.    SIGNATURES/CONFIDENTIALITY: You and/or your care partner have signed paperwork which will be entered into your electronic medical record.  These signatures attest to the fact that that the information above on your After Visit Summary has been reviewed and is understood.  Full responsibility of the confidentiality of this discharge information lies with you and/or your care-partner. 

## 2019-09-21 NOTE — Progress Notes (Signed)
To PACU, VSS. Report to [email or web address] 

## 2019-09-21 NOTE — Progress Notes (Signed)
Called to room to assist during endoscopic procedure.  Patient ID and intended procedure confirmed with present staff. Received instructions for my participation in the procedure from the performing physician.  

## 2019-09-23 ENCOUNTER — Telehealth: Payer: Self-pay

## 2019-09-23 NOTE — Telephone Encounter (Signed)
Covid-19 screening questions   Do you now or have you had a fever in the last 14 days? No.  Do you have any respiratory symptoms of shortness of breath or cough now or in the last 14 days? No.  Do you have any family members or close contacts with diagnosed or suspected Covid-19 in the past 14 days? No.  Have you been tested for Covid-19 and found to be positive? No.       Follow up Call-  Call back number 09/21/2019  Post procedure Call Back phone  # [phone]  Permission to leave phone message Yes  Some recent data might be hidden     Patient questions:  Do you have a fever, pain , or abdominal swelling? No. Pain Score  0 *  Have you tolerated food without any problems? Yes.    Have you been able to return to your normal activities? Yes.    Do you have any questions about your discharge instructions: Diet   No. Medications  No. Follow up visit  No.  Do you have questions or concerns about your Care? No.  Actions: * If pain score is 4 or above: No action needed, pain <4.

## 2019-09-27 ENCOUNTER — Encounter: Payer: Self-pay | Admitting: Gastroenterology

## 2019-10-15 ENCOUNTER — Other Ambulatory Visit: Payer: Self-pay | Admitting: Emergency Medicine

## 2019-10-18 ENCOUNTER — Other Ambulatory Visit: Payer: Self-pay | Admitting: Family Medicine

## 2019-11-02 IMAGING — CR DG HIP (WITH OR WITHOUT PELVIS) 2-3V LEFT
3 series · 3 of 3 positions shown · non-contrast
Comparison: March 23, 2017

CLINICAL DATA: Chronic pain in left hip.

EXAM:
DG HIP (WITH OR WITHOUT PELVIS) 2-3V LEFT

[pelvis ap]
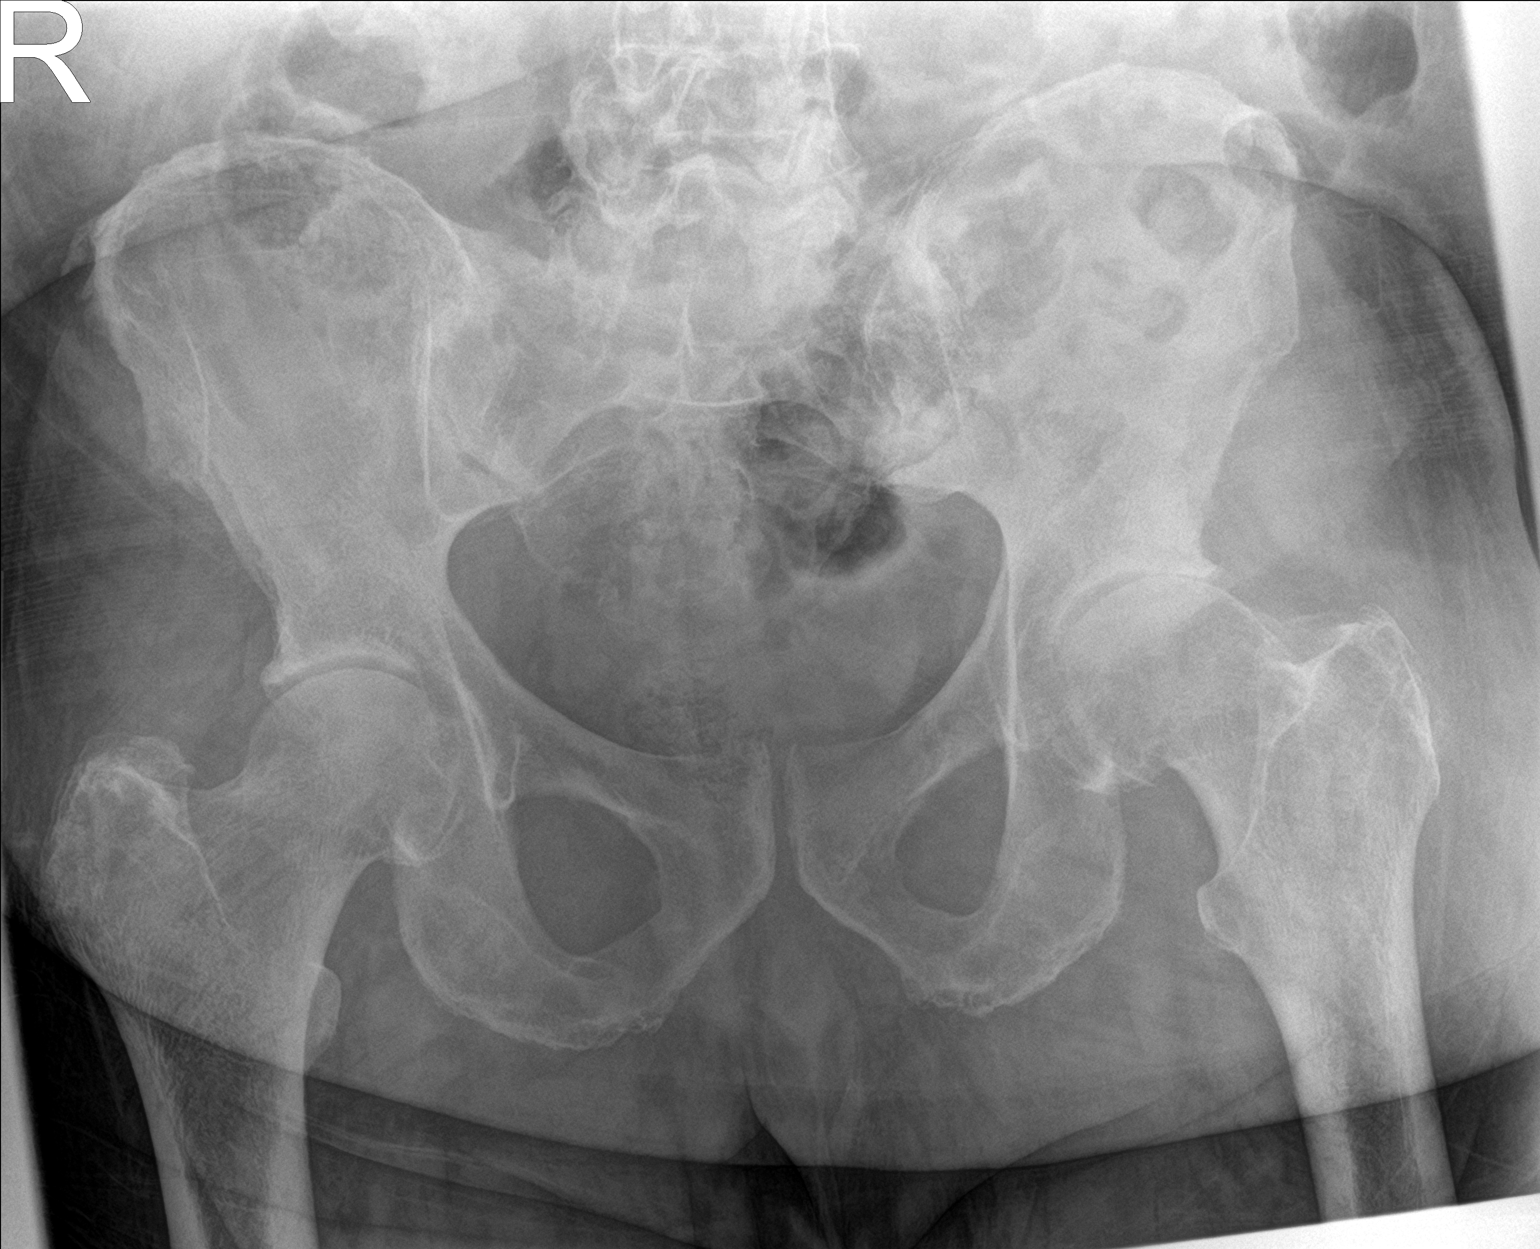

[hip ap]
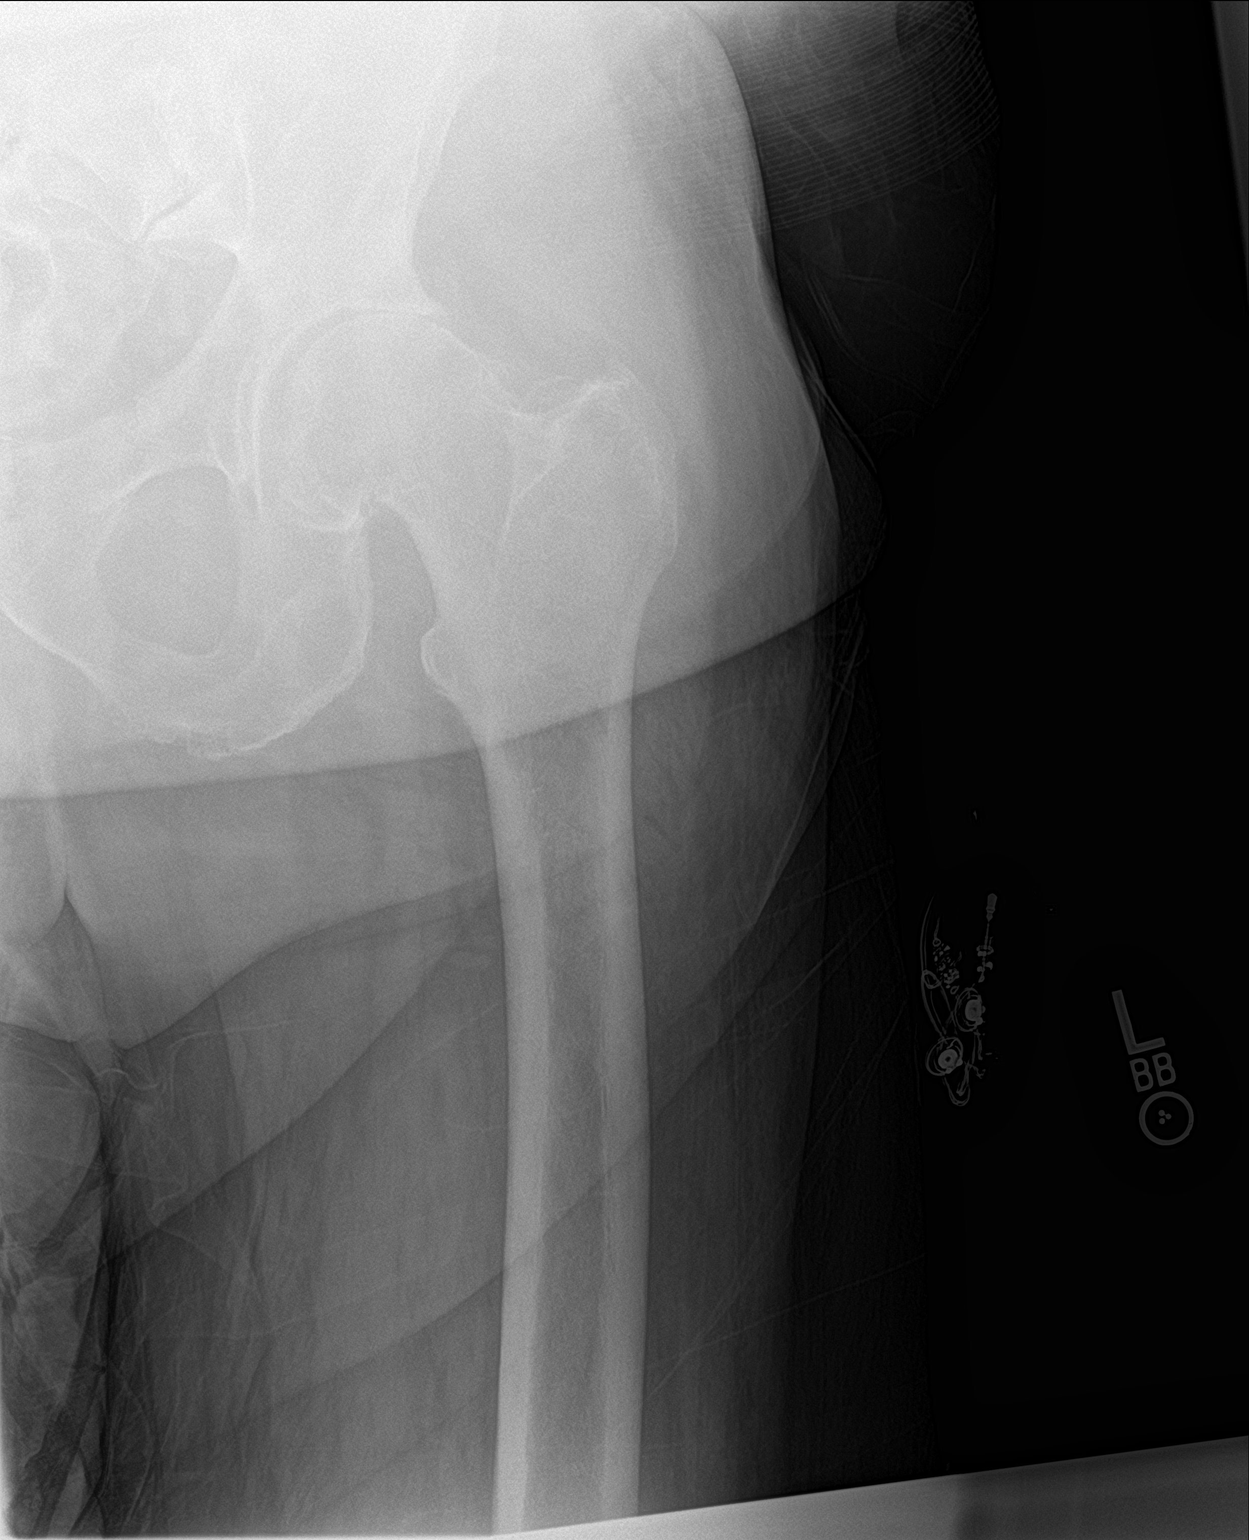

[hip lat]
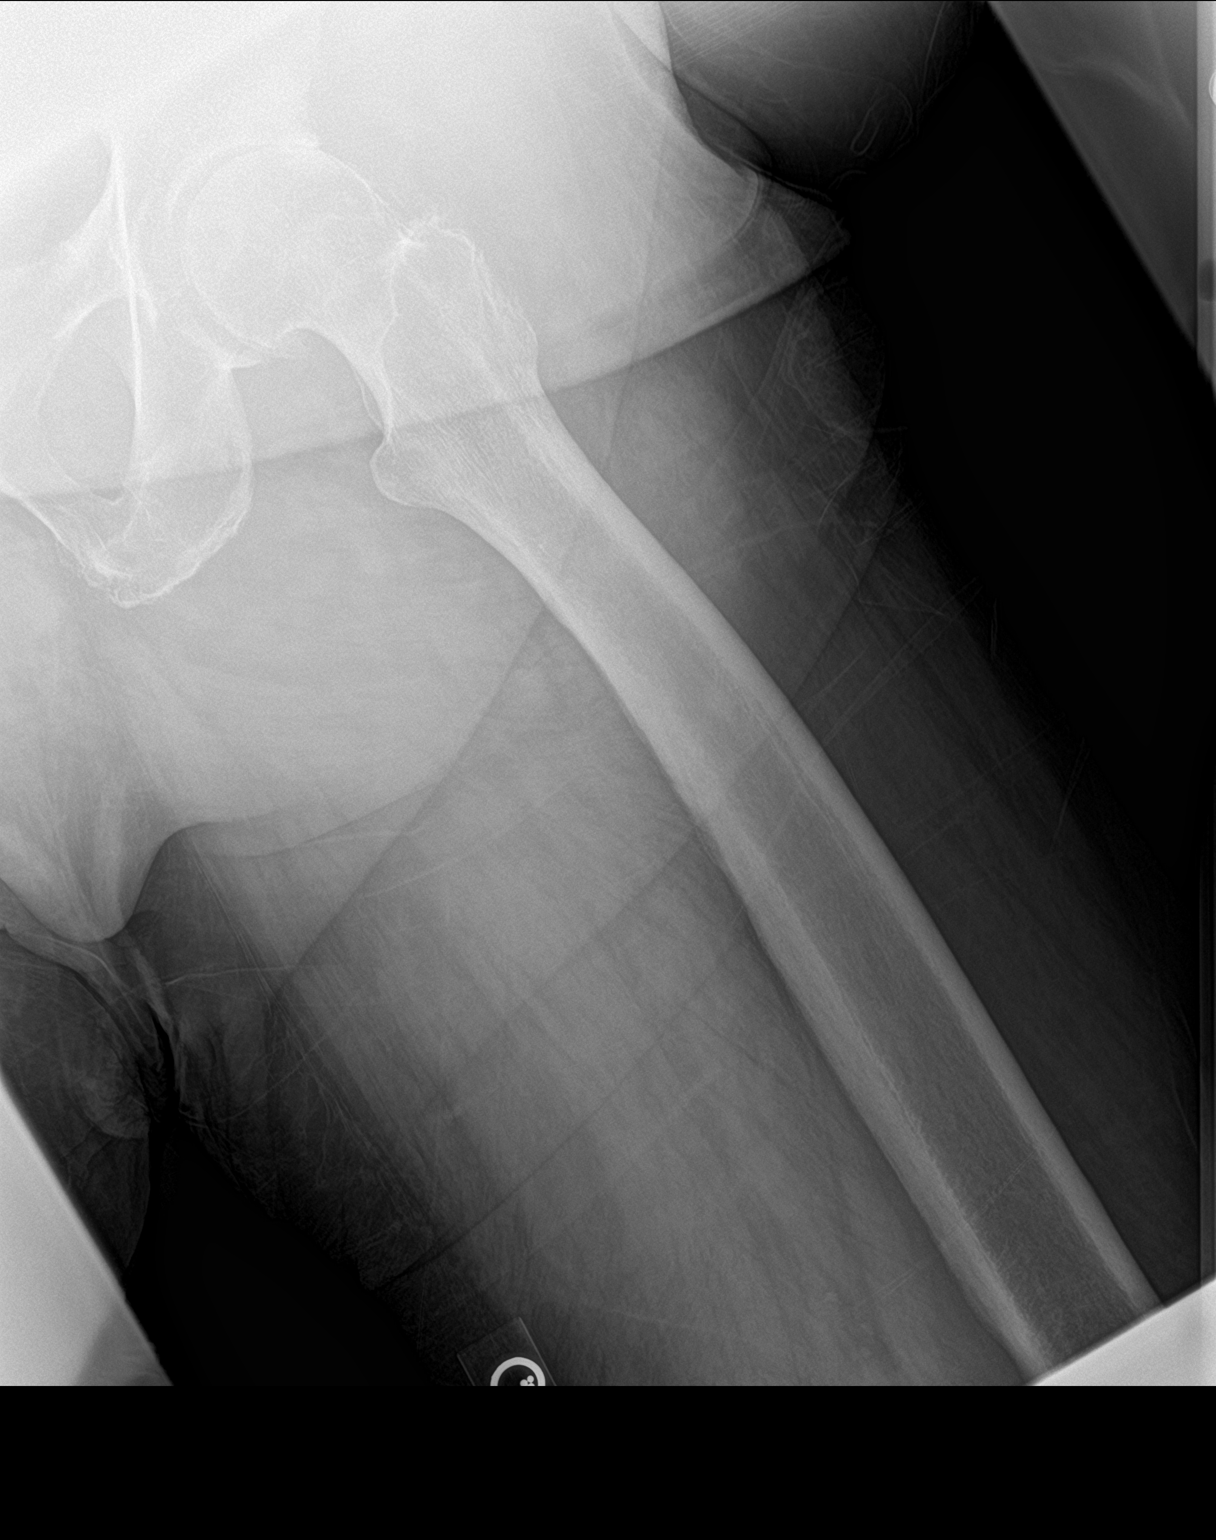

[3 of 3 positions shown; findings below may reference images not displayed]

FINDINGS: There are degenerative changes in the left hip with osteophytes off
the femoral head and loss of joint space superiorly. No convincing
evidence of fracture. No dislocation. No other acute abnormalities.
IMPRESSION: Degenerative changes in the left hip with loss of joint space
superiorly and osteophytes. No convincing evidence of fracture.

## 2020-03-03 ENCOUNTER — Other Ambulatory Visit: Payer: Self-pay | Admitting: Emergency Medicine

## 2020-03-03 DIAGNOSIS — Z8639 Personal history of other endocrine, nutritional and metabolic disease: Secondary | ICD-10-CM

## 2020-04-02 ENCOUNTER — Encounter: Payer: Self-pay | Admitting: Family Medicine

## 2020-04-02 ENCOUNTER — Other Ambulatory Visit: Payer: Self-pay

## 2020-04-02 ENCOUNTER — Ambulatory Visit: Payer: 59 | Admitting: Family Medicine

## 2020-04-02 VITALS — BP 154/72 | HR 92 | Ht 69.0 in | Wt 283.0 lb

## 2020-04-02 DIAGNOSIS — E782 Mixed hyperlipidemia: Secondary | ICD-10-CM | POA: Diagnosis not present

## 2020-04-02 DIAGNOSIS — E785 Hyperlipidemia, unspecified: Secondary | ICD-10-CM

## 2020-04-02 DIAGNOSIS — Z794 Long term (current) use of insulin: Secondary | ICD-10-CM

## 2020-04-02 DIAGNOSIS — E119 Type 2 diabetes mellitus without complications: Secondary | ICD-10-CM | POA: Diagnosis not present

## 2020-04-02 DIAGNOSIS — E1169 Type 2 diabetes mellitus with other specified complication: Secondary | ICD-10-CM

## 2020-04-02 LAB — POCT GLYCOSYLATED HEMOGLOBIN (HGB A1C): Hemoglobin A1C: 6.2 % — AB (ref 4.0–5.6)

## 2020-04-02 NOTE — Patient Instructions (Signed)
It was great seeing you today.  You are doing wonderful work on your diabetes as well as your weight!  I want you to keep up the good work.  Regarding your diabetes I want you to continue on your current medication regimen.  For your back and hip pain I want you to discontinue the Aleve and start taking Tylenol arthritic pain.  You can take this up to every 6 hours and please do not take more than 4 g/day.  I am collecting some routine lab work on you today and will post those results to your MyChart or call you with the results.  If you have any issues, questions, concerns please feel free to call the clinic.  I hope you have a wonderful afternoon!

## 2020-04-02 NOTE — Progress Notes (Signed)
    SUBJECTIVE:   CHIEF COMPLAINT / HPI:   Diabetes Checkup  Patient reports his diabetes has been doing well and that he has been very compliant with his medication as well as work on diet.  He is losing weight because he wants to have a hip surgery.  He has lost 30 pound so far.  I congratulated him on his strong work.  His recent hemoglobin A1c was 6.3.  Denies any signs or symptoms of hypoglycemia and reports his morning blood sugars generally run in the low 100s.  Back and hip pain Patient has chronic back and hip pain.  He is working to have hip procedure done at this time but needs to lose an additional 30 pounds.  He has been taking 800 mg ibuprofen every morning to help with the pain.  I discussed switching to acetaminophen to decrease the risk of gastric ulcers and bleeds.  Patient is agreeable to this and will switch to acetaminophen arthritic pain.  PERTINENT  PMH / PSH: T2DM  OBJECTIVE:   BP (!) 154/72   Pulse 92   Ht 5\' 9"  (1.753 m)   Wt 283 lb (128.4 kg)   SpO2 98%   BMI 41.79 kg/m   General: Well-appearing, no acute distress Respiratory: Normal work of breathing, lungs clear to auscultation bilaterally Cardiac: Regular rate and rhythm, no murmurs appreciated Abdomen: Soft, nontender, positive bowel sounds MSK: Patient ambulates with a cane, has left hip pain and knee pain as well as diffuse lower back pain  Diabetic foot exam was performed with the following findings:   No deformities, ulcerations, or other skin breakdown Intact posterior tibialis and dorsalis pedis pulses Decreased sensation on fifth digit of right foot.       ASSESSMENT/PLAN:   Type 2 diabetes mellitus (HCC) Hemoglobin A1c 6.2 from 6.3 at previous visit in February 2021.  Current medication includes Metformin 1000 mg daily.  Diabetic foot exam performed today showing decreased sensation in right fifth digit -Continue Metformin XR 1000 mg in the morning. -Continue dietary changes to help  with weight loss -Follow-up in 6 months for repeat hemoglobin A1c and diabetes check -BMP today   Hyperlipidemia Patient's most recent lipid panel showed total cholesterol 146, HDL 33, LDL 96.  Patient currently on Crestor and is compliant with this medication. -Repeat lipid panel today -We will adjust Crestor as necessary   Chronic low back pain Patient will discontinue use of 800 mg ibuprofen daily and transition to Tylenol arthritic pain.  Gifford Shave, MD [place]

## 2020-04-03 DIAGNOSIS — E785 Hyperlipidemia, unspecified: Secondary | ICD-10-CM | POA: Insufficient documentation

## 2020-04-03 LAB — BASIC METABOLIC PANEL
BUN/Creatinine Ratio: 18 (ref 10–24)
BUN: 12 mg/dL (ref 8–27)
CO2: 25 mmol/L (ref 20–29)
Calcium: 9.1 mg/dL (ref 8.6–10.2)
Chloride: 100 mmol/L (ref 96–106)
Creatinine, Ser: 0.68 mg/dL — ABNORMAL LOW (ref 0.76–1.27)
GFR calc Af Amer: 120 mL/min/{1.73_m2} (ref >59)
GFR calc non Af Amer: 104 mL/min/{1.73_m2} (ref >59)
Glucose: 95 mg/dL (ref 65–99)
Potassium: 4.3 mmol/L (ref 3.5–5.2)
Sodium: 136 mmol/L (ref 134–144)

## 2020-04-03 LAB — LIPID PANEL
Chol/HDL Ratio: 4.6 ratio (ref 0.0–5.0)
Cholesterol, Total: 153 mg/dL (ref 100–199)
HDL: 33 mg/dL — ABNORMAL LOW (ref >39)
LDL Chol Calc (NIH): 104 mg/dL — ABNORMAL HIGH (ref 0–99)
Triglycerides: 84 mg/dL (ref 0–149)
VLDL Cholesterol Cal: 16 mg/dL (ref 5–40)

## 2020-04-03 NOTE — Assessment & Plan Note (Addendum)
Hemoglobin A1c 6.2 from 6.3 at previous visit in February 2021.  Current medication includes Metformin 1000 mg daily.  Diabetic foot exam performed today showing decreased sensation in right fifth digit -Continue Metformin XR 1000 mg in the morning. -Continue dietary changes to help with weight loss -Follow-up in 6 months for repeat hemoglobin A1c and diabetes check -BMP today

## 2020-04-03 NOTE — Assessment & Plan Note (Signed)
Patient's most recent lipid panel showed total cholesterol 146, HDL 33, LDL 96.  Patient currently on Crestor and is compliant with this medication. -Repeat lipid panel today -We will adjust Crestor as necessary

## 2020-04-11 LAB — HM DIABETES EYE EXAM

## 2020-04-15 ENCOUNTER — Other Ambulatory Visit: Payer: Self-pay | Admitting: Family Medicine

## 2020-07-19 DIAGNOSIS — M25552 Pain in left hip: Secondary | ICD-10-CM | POA: Diagnosis not present

## 2020-08-06 DIAGNOSIS — I739 Peripheral vascular disease, unspecified: Secondary | ICD-10-CM | POA: Diagnosis not present

## 2020-08-06 DIAGNOSIS — L84 Corns and callosities: Secondary | ICD-10-CM | POA: Diagnosis not present

## 2020-08-06 DIAGNOSIS — L603 Nail dystrophy: Secondary | ICD-10-CM | POA: Diagnosis not present

## 2020-08-06 DIAGNOSIS — E1151 Type 2 diabetes mellitus with diabetic peripheral angiopathy without gangrene: Secondary | ICD-10-CM | POA: Diagnosis not present

## 2020-10-16 ENCOUNTER — Telehealth: Payer: Self-pay

## 2020-10-16 NOTE — Progress Notes (Addendum)
COVID Vaccine Completed: Date COVID Vaccine completed: Has received booster: COVID vaccine manufacturer: [place]   Date of COVID positive in last 90 days:  PCP - Gifford Shave, MD Cardiologist -   Chest x-ray -  EKG - 10-19-20 Epic Stress Test -  ECHO -  Cardiac Cath -  Pacemaker/ICD device last checked: Spinal Cord Stimulator:  Sleep Study -  CPAP -   Fasting Blood Sugar -  Checks Blood Sugar _____ times a day  Blood Thinner Instructions:on aspirin Aspirin Instructions:  ASA 81 mg Last Dose:  Activity level:  Can go up a flight of stairs and perform activities of daily living without stopping and without symptoms of chest pain or shortness of breath.   Able to exercise without symptoms  Unable to go up a flight of stairs without symptoms of      Anesthesia review: DM, sleep apnea, HTN  Patient denies shortness of breath, fever, cough and chest pain at PAT appointment   Patient verbalized understanding of instructions that were given to them at the PAT appointment. Patient was also instructed that they will need to review over the PAT instructions again at home before surgery.

## 2020-10-16 NOTE — Telephone Encounter (Signed)
[employer] Placard form dropped off for at front desk for completion.  Verified that patient section of form has been completed.  Last DOS/WCC with PCP was 04/02/2020 .  Placed form in team folder to be completed by clinical staff.  [employer]

## 2020-10-16 NOTE — Patient Instructions (Addendum)
DUE TO COVID-19 ONLY ONE VISITOR IS ALLOWED TO COME WITH YOU AND STAY IN THE WAITING ROOM ONLY DURING PRE OP AND PROCEDURE.   **NO VISITORS ARE ALLOWED IN THE SHORT STAY AREA OR RECOVERY ROOM!!**  IF YOU WILL BE ADMITTED INTO THE HOSPITAL YOU ARE ALLOWED ONLY TWO SUPPORT PEOPLE DURING VISITATION HOURS ONLY (10AM -8PM)   The support person(s) may change daily. The support person(s) must pass our screening, gel in and out, and wear a mask at all times, including in the patient's room. Patients must also wear a mask when staff or their support person are in the room.  No visitors under the age of 40. Any visitor under the age of 75 must be accompanied by an adult.    COVID SWAB TESTING MUST BE COMPLETED ON:  10/22/20 @   [address] [place],  [address]   You are not required to quarantine, however you are required to wear a well-fitted mask when you are out and around people not in your household.  Hand Hygiene often Do NOT share personal items Notify your provider if you are in close contact with someone who has COVID or you develop fever 100.4 or greater, new onset of sneezing, cough, sore throat, shortness of breath or body aches.   Your procedure is scheduled on: 10/24/20   Report to [facility] Main  Entrance    Report to admitting at 11:00 AM   Call this number if you have problems the morning of surgery [phone]   Do not eat food :After Midnight.   May have liquids until  10:30 AM  day of surgery  CLEAR LIQUID DIET  Foods Allowed                                                                     Foods Excluded  Water, Black Coffee and tea, regular and decaf             liquids that you cannot  Plain Jell-O in any flavor  (No red)                                    see through such as: Fruit ices (not with fruit pulp)                                            milk, soups, orange juice              Iced Popsicles (No red)                                                 All solid food                                   Apple juices Sports drinks like Gatorade (No red) Lightly seasoned clear broth  or consume(fat free) Sugar, honey syrup     Complete one G2 drink the morning of surgery at  10:30 AM the day of surgery.     The day of surgery:  Drink ONE (1) G2 by 10:30 am the morning of surgery. Drink in one sitting. Do not sip.  This drink was given to you during your hospital  pre-op appointment visit. Nothing else to drink after completing the  or G2.          If you have questions, please contact your surgeon's office.     Oral Hygiene is also important to reduce your risk of infection.                                    Remember - BRUSH YOUR TEETH THE MORNING OF SURGERY WITH YOUR REGULAR TOOTHPASTE   Do NOT smoke after Midnight   Take these medicines the morning of surgery with A SIP OF WATER:  Rosuvastatin.   DO NOT TAKE ANY ORAL DIABETIC MEDICATIONS DAY OF YOUR SURGERY  How to Manage Your Diabetes Before and After Surgery  Why is it important to control my blood sugar before and after surgery? Improving blood sugar levels before and after surgery helps healing and can limit problems. A way of improving blood sugar control is eating a healthy diet by:  Eating less sugar and carbohydrates  Increasing activity/exercise  Talking with your doctor about reaching your blood sugar goals High blood sugars (greater than 180 mg/dL) can raise your risk of infections and slow your recovery, so you will need to focus on controlling your diabetes during the weeks before surgery. Make sure that the doctor who takes care of your diabetes knows about your planned surgery including the date and location.  How do I manage my blood sugar before surgery? Check your blood sugar at least 4 times a day, starting 2 days before surgery, to make sure that the level is not too high or low. Check your blood sugar the morning of your surgery when you  wake up and every 2 hours until you get to the Short Stay unit. If your blood sugar is less than 70 mg/dL, you will need to treat for low blood sugar: Do not take insulin. Treat a low blood sugar (less than 70 mg/dL) with  cup of clear juice (cranberry or apple), 4 glucose tablets, OR glucose gel. Recheck blood sugar in 15 minutes after treatment (to make sure it is greater than 70 mg/dL). If your blood sugar is not greater than 70 mg/dL on recheck, call [phone] for further instructions. Report your blood sugar to the short stay nurse when you get to Short Stay.  If you are admitted to the hospital after surgery: Your blood sugar will be checked by the staff and you will probably be given insulin after surgery (instead of oral diabetes medicines) to make sure you have good blood sugar levels. The goal for blood sugar control after surgery is 80-180 mg/dL.   WHAT DO I DO ABOUT MY DIABETES MEDICATION?  Do not take oral diabetes medicines (pills) the morning of surgery.  THE DAY BEFORE SURGERY, take Metformin as prescribed.       THE MORNING OF SURGERY, do not take Metformin.    Reviewed and Endorsed by [facility] Patient Education Committee, August 2015  You may not have any metal on your body including jewelry, and body piercing             Do not wear lotions, powders, cologne, or deodorant              Men may shave face and neck.   Do not bring valuables to the hospital. [place].   Contacts, dentures or bridgework may not be worn into surgery.   Bring small overnight bag day of surgery.    Special Instructions: Bring a copy of your healthcare power of attorney and living will documents         the day of surgery if you haven't scanned them  in before.              Please read over the following fact sheets you were given: IF YOU HAVE QUESTIONS ABOUT YOUR PRE OP INSTRUCTIONS PLEASE CALL   [phone]   [place] - Preparing for Surgery Before surgery, you can play an important role.  Because skin is not sterile, your skin needs to be as free of germs as possible.  You can reduce the number of germs on your skin by washing with CHG (chlorahexidine gluconate) soap before surgery.  CHG is an antiseptic cleaner which kills germs and bonds with the skin to continue killing germs even after washing. Please DO NOT use if you have an allergy to CHG or antibacterial soaps.  If your skin becomes reddened/irritated stop using the CHG and inform your nurse when you arrive at Short Stay. Do not shave (including legs and underarms) for at least 48 hours prior to the first CHG shower.  You may shave your face/neck.  Please follow these instructions carefully:  1.  Shower with CHG Soap the night before surgery and the  morning of surgery.  2.  If you choose to wash your hair, wash your hair first as usual with your normal  shampoo.  3.  After you shampoo, rinse your hair and body thoroughly to remove the shampoo.                             4.  Use CHG as you would any other liquid soap.  You can apply chg directly to the skin and wash.  Gently with a scrungie or clean washcloth.  5.  Apply the CHG Soap to your body ONLY FROM THE NECK DOWN.   Do   not use on face/ open                           Wound or open sores. Avoid contact with eyes, ears mouth and   genitals (private parts).                       Wash face,  Genitals (private parts) with your normal soap.             6.  Wash thoroughly, paying special attention to the area where your    surgery  will be performed.  7.  Thoroughly rinse your body with warm water from the neck down.  8.  DO NOT shower/wash with your normal soap after using and rinsing off the CHG Soap.                9.  Pat yourself  dry with a clean towel.            10.  Wear clean pajamas.            11.  Place clean sheets on your bed the night of your first shower  and do not  sleep with pets. Day of Surgery : Do not apply any lotions/deodorants the morning of surgery.  Please wear clean clothes to the hospital/surgery center.  FAILURE TO FOLLOW THESE INSTRUCTIONS MAY RESULT IN THE CANCELLATION OF YOUR SURGERY  PATIENT SIGNATURE_________________________________  NURSE SIGNATURE__________________________________  ________________________________________________________________________   Adam Phenix  An incentive spirometer is a tool that can help keep your lungs clear and active. This tool measures how well you are filling your lungs with each breath. Taking long deep breaths may help reverse or decrease the chance of developing breathing (pulmonary) problems (especially infection) following: A long period of time when you are unable to move or be active. BEFORE THE PROCEDURE  If the spirometer includes an indicator to show your best effort, your nurse or respiratory therapist will set it to a desired goal. If possible, sit up straight or lean slightly forward. Try not to slouch. Hold the incentive spirometer in an upright position. INSTRUCTIONS FOR USE  Sit on the edge of your bed if possible, or sit up as far as you can in bed or on a chair. Hold the incentive spirometer in an upright position. Breathe out normally. Place the mouthpiece in your mouth and seal your lips tightly around it. Breathe in slowly and as deeply as possible, raising the piston or the ball toward the top of the column. Hold your breath for 3-5 seconds or for as long as possible. Allow the piston or ball to fall to the bottom of the column. Remove the mouthpiece from your mouth and breathe out normally. Rest for a few seconds and repeat Steps 1 through 7 at least 10 times every 1-2 hours when you are awake. Take your time and take a few normal breaths between deep breaths. The spirometer may include an indicator to show your best effort. Use the indicator as a goal  to work toward during each repetition. After each set of 10 deep breaths, practice coughing to be sure your lungs are clear. If you have an incision (the cut made at the time of surgery), support your incision when coughing by placing a pillow or rolled up towels firmly against it. Once you are able to get out of bed, walk around indoors and cough well. You may stop using the incentive spirometer when instructed by your caregiver.  RISKS AND COMPLICATIONS Take your time so you do not get dizzy or light-headed. If you are in pain, you may need to take or ask for pain medication before doing incentive spirometry. It is harder to take a deep breath if you are having pain. AFTER USE Rest and breathe slowly and easily. It can be helpful to keep track of a log of your progress. Your caregiver can provide you with a simple table to help with this. If you are using the spirometer at home, follow these instructions: [place] IF:  You are having difficultly using the spirometer. You have trouble using the spirometer as often as instructed. Your pain medication is not giving enough relief while using the spirometer. You develop fever of 100.5 F (38.1 C) or higher. SEEK IMMEDIATE MEDICAL CARE IF:  You cough up bloody sputum that had not been present  before. You develop fever of 102 F (38.9 C) or greater. You develop worsening pain at or near the incision site. MAKE SURE YOU:  Understand these instructions. Will watch your condition. Will get help right away if you are not doing well or get worse. Document Released: 09/08/2006 Document Revised: 07/21/2011 Document Reviewed: 11/09/2006 [employer] Patient Information 2014 [employer], [place].   ________________________________________________________________________  WHAT IS A BLOOD TRANSFUSION? Blood Transfusion Information  A transfusion is the replacement of blood or some of its parts. Blood is made up of multiple cells which provide  different functions. Red blood cells carry oxygen and are used for blood loss replacement. White blood cells fight against infection. Platelets control bleeding. Plasma helps clot blood. Other blood products are available for specialized needs, such as hemophilia or other clotting disorders. BEFORE THE TRANSFUSION  Who gives blood for transfusions?  Healthy volunteers who are fully evaluated to make sure their blood is safe. This is blood bank blood. Transfusion therapy is the safest it has ever been in the practice of medicine. Before blood is taken from a donor, a complete history is taken to make sure that person has no history of diseases nor engages in risky social behavior (examples are intravenous drug use or sexual activity with multiple partners). The donor's travel history is screened to minimize risk of transmitting infections, such as malaria. The donated blood is tested for signs of infectious diseases, such as HIV and hepatitis. The blood is then tested to be sure it is compatible with you in order to minimize the chance of a transfusion reaction. If you or a relative donates blood, this is often done in anticipation of surgery and is not appropriate for emergency situations. It takes many days to process the donated blood. RISKS AND COMPLICATIONS Although transfusion therapy is very safe and saves many lives, the main dangers of transfusion include:  Getting an infectious disease. Developing a transfusion reaction. This is an allergic reaction to something in the blood you were given. Every precaution is taken to prevent this. The decision to have a blood transfusion has been considered carefully by your caregiver before blood is given. Blood is not given unless the benefits outweigh the risks. AFTER THE TRANSFUSION Right after receiving a blood transfusion, you will usually feel much better and more energetic. This is especially true if your red blood cells have gotten low (anemic).  The transfusion raises the level of the red blood cells which carry oxygen, and this usually causes an energy increase. The nurse administering the transfusion will monitor you carefully for complications. HOME CARE INSTRUCTIONS  No special instructions are needed after a transfusion. You may find your energy is better. Speak with your caregiver about any limitations on activity for underlying diseases you may have. SEEK MEDICAL CARE IF:  Your condition is not improving after your transfusion. You develop redness or irritation at the intravenous (IV) site. SEEK IMMEDIATE MEDICAL CARE IF:  Any of the following symptoms occur over the next 12 hours: Shaking chills. You have a temperature by mouth above 102 F (38.9 C), not controlled by medicine. Chest, back, or muscle pain. People around you feel you are not acting correctly or are confused. Shortness of breath or difficulty breathing. Dizziness and fainting. You get a rash or develop hives. You have a decrease in urine output. Your urine turns a dark color or changes to pink, red, or brown. Any of the following symptoms occur over the next 10 days: You have a  temperature by mouth above 102 F (38.9 C), not controlled by medicine. Shortness of breath. Weakness after normal activity. The white part of the eye turns yellow (jaundice). You have a decrease in the amount of urine or are urinating less often. Your urine turns a dark color or changes to pink, red, or brown. Document Released: 04/25/2000 Document Revised: 07/21/2011 Document Reviewed: 12/13/2007 [employer] Patient Information 2014 [place], [place].  _______________________________________________________________________

## 2020-10-17 ENCOUNTER — Other Ambulatory Visit: Payer: Self-pay | Admitting: Family Medicine

## 2020-10-19 ENCOUNTER — Encounter (HOSPITAL_COMMUNITY): Payer: Self-pay

## 2020-10-19 ENCOUNTER — Encounter (HOSPITAL_COMMUNITY)
Admission: RE | Admit: 2020-10-19 | Discharge: 2020-10-19 | Disposition: A | Discharge status: Home / Self Care | Payer: Self-pay | Source: Ambulatory Visit | Attending: Anesthesiology | Admitting: Anesthesiology

## 2020-10-19 NOTE — Telephone Encounter (Signed)
Clinical info completed on Hand[employer] form.  Place form in Dr. Hulen Luster box for completion.  Ottis Stain, CMA

## 2020-10-24 ENCOUNTER — Ambulatory Visit (HOSPITAL_COMMUNITY): Admission: RE | Admit: 2020-10-24 | Payer: 59 | Source: Home / Self Care | Admitting: Orthopedic Surgery

## 2020-10-24 ENCOUNTER — Encounter (HOSPITAL_COMMUNITY): Admission: RE | Payer: Self-pay | Source: Home / Self Care

## 2020-10-24 SURGERY — ARTHROPLASTY, HIP, TOTAL, ANTERIOR APPROACH
Anesthesia: Choice | Site: Hip | Laterality: Left

## 2020-10-24 NOTE — Telephone Encounter (Signed)
Pt returned call. Pt informed. Pt asked for the form to be mailed because he has already received one copy of the form in the mail. Ottis Stain, CMA

## 2020-11-29 NOTE — Patient Instructions (Addendum)
DUE TO COVID-19 ONLY ONE VISITOR IS ALLOWED TO COME WITH YOU AND STAY IN THE WAITING ROOM ONLY DURING PRE OP AND PROCEDURE.   **NO VISITORS ARE ALLOWED IN THE SHORT STAY AREA OR RECOVERY ROOM!!**  IF YOU WILL BE ADMITTED INTO THE HOSPITAL YOU ARE ALLOWED ONLY TWO SUPPORT PEOPLE DURING VISITATION HOURS ONLY (10AM -8PM)   The support person(s) may change daily. The support person(s) must pass our screening, gel in and out, and wear a mask at all times, including in the patient's room. Patients must also wear a mask when staff or their support person are in the room.  No visitors under the age of 78. Any visitor under the age of 38 must be accompanied by an adult.    COVID SWAB TESTING MUST BE COMPLETED ON:  12/06/20 @ 9:00 AM   [address] [place], [place] [address]   [place] (backside of the building) You are not required to quarantine, however you are required to wear a well-fitted mask when you are out and around people not in your household.  Hand Hygiene often Do NOT share personal items Notify your provider if you are in close contact with someone who has COVID or you develop fever 100.4 or greater, new onset of sneezing, cough, sore throat, shortness of breath or body aches.       Your procedure is scheduled on: 12/10/20    Report to [facility] Main  Entrance    Report to admitting at 11:15 AM   Call this number if you have problems the morning of surgery [phone]   Do not eat food :After Midnight.   May have liquids until  10:45 AM  day of surgery  CLEAR LIQUID DIET  Foods Allowed                                                                     Foods Excluded  Water, Black Coffee and tea, regular and decaf               liquids that you cannot  Plain Jell-O in any flavor  (No red)                                     see through such as: Fruit ices (not with fruit pulp)                                             milk, soups,  orange juice              Iced Popsicles (No red)                                                All solid food  Apple juices Sports drinks like Gatorade (No red) Lightly seasoned clear broth or consume(fat free) Sugar, honey syrup     The day of surgery:  Drink ONE (1) Pre-Surgery G2 by 10:45 am the morning of surgery. Drink in one sitting. Do not sip.  This drink was given to you during your hospital  pre-op appointment visit. Nothing else to drink after completing the  Pre-Surgery G2.          If you have questions, please contact your surgeon's office.     Oral Hygiene is also important to reduce your risk of infection.                                    Remember - BRUSH YOUR TEETH THE MORNING OF SURGERY WITH YOUR REGULAR TOOTHPASTE   Do NOT smoke after Midnight   Take these medicines the morning of surgery with A SIP OF WATER: Acetaminophen, Rosuvastatin.   DO NOT TAKE ANY ORAL DIABETIC MEDICATIONS DAY OF YOUR SURGERY  How to Manage Your Diabetes Before and After Surgery  Why is it important to control my blood sugar before and after surgery? Improving blood sugar levels before and after surgery helps healing and can limit problems. A way of improving blood sugar control is eating a healthy diet by:  Eating less sugar and carbohydrates  Increasing activity/exercise  Talking with your doctor about reaching your blood sugar goals High blood sugars (greater than 180 mg/dL) can raise your risk of infections and slow your recovery, so you will need to focus on controlling your diabetes during the weeks before surgery. Make sure that the doctor who takes care of your diabetes knows about your planned surgery including the date and location.  How do I manage my blood sugar before surgery? Check your blood sugar at least 4 times a day, starting 2 days before surgery, to make sure that the level is not too high or low. Check your blood sugar the  morning of your surgery when you wake up and every 2 hours until you get to the Short Stay unit. If your blood sugar is less than 70 mg/dL, you will need to treat for low blood sugar: Do not take insulin. Treat a low blood sugar (less than 70 mg/dL) with  cup of clear juice (cranberry or apple), 4 glucose tablets, OR glucose gel. Recheck blood sugar in 15 minutes after treatment (to make sure it is greater than 70 mg/dL). If your blood sugar is not greater than 70 mg/dL on recheck, call [phone] for further instructions. Report your blood sugar to the short stay nurse when you get to Short Stay.  If you are admitted to the hospital after surgery: Your blood sugar will be checked by the staff and you will probably be given insulin after surgery (instead of oral diabetes medicines) to make sure you have good blood sugar levels. The goal for blood sugar control after surgery is 80-180 mg/dL.   WHAT DO I DO ABOUT MY DIABETES MEDICATION?  Do not take oral diabetes medicines (pills) the morning of surgery.  THE DAY BEFORE SURGERY, take Metformin as prescribed.      THE MORNING OF SURGERY, do not take Metformin.   Reviewed and Endorsed by [facility] Patient Education Committee, August 2015  You may not have any metal on your body including hair pins, jewelry, and body piercing             Do not wear lotions, powders, cologne, or deodorant              Men may shave face and neck.   Do not bring valuables to the hospital. [place].   Contacts, dentures or bridgework may not be worn into surgery.   Bring small overnight bag day of surgery.   Please read over the following fact sheets you were given: IF YOU HAVE QUESTIONS ABOUT YOUR PRE OP INSTRUCTIONS PLEASE CALL [place] - Preparing for Surgery Before surgery, you can play an important role.  Because skin is not sterile, your  skin needs to be as free of germs as possible.  You can reduce the number of germs on your skin by washing with CHG (chlorahexidine gluconate) soap before surgery.  CHG is an antiseptic cleaner which kills germs and bonds with the skin to continue killing germs even after washing. Please DO NOT use if you have an allergy to CHG or antibacterial soaps.  If your skin becomes reddened/irritated stop using the CHG and inform your nurse when you arrive at Short Stay. Do not shave (including legs and underarms) for at least 48 hours prior to the first CHG shower.  You may shave your face/neck.  Please follow these instructions carefully:  1.  Shower with CHG Soap the night before surgery and the  morning of surgery.  2.  If you choose to wash your hair, wash your hair first as usual with your normal  shampoo.  3.  After you shampoo, rinse your hair and body thoroughly to remove the shampoo.                             4.  Use CHG as you would any other liquid soap.  You can apply chg directly to the skin and wash.  Gently with a scrungie or clean washcloth.  5.  Apply the CHG Soap to your body ONLY FROM THE NECK DOWN.   Do   not use on face/ open                           Wound or open sores. Avoid contact with eyes, ears mouth and   genitals (private parts).                       Wash face,  Genitals (private parts) with your normal soap.             6.  Wash thoroughly, paying special attention to the area where your    surgery  will be performed.  7.  Thoroughly rinse your body with warm water from the neck down.  8.  DO NOT shower/wash with your normal soap after using and rinsing off the CHG Soap.                9.  Pat yourself dry with a clean towel.            10.  Wear clean pajamas.            11.  Place clean sheets  on your bed the night of your first shower and do not  sleep with pets. Day of Surgery : Do not apply any lotions/deodorants the morning of surgery.  Please wear clean clothes to  the hospital/surgery center.  FAILURE TO FOLLOW THESE INSTRUCTIONS MAY RESULT IN THE CANCELLATION OF YOUR SURGERY  PATIENT SIGNATURE_________________________________  NURSE SIGNATURE__________________________________  ________________________________________________________________________   Adam Phenix  An incentive spirometer is a tool that can help keep your lungs clear and active. This tool measures how well you are filling your lungs with each breath. Taking long deep breaths may help reverse or decrease the chance of developing breathing (pulmonary) problems (especially infection) following: A long period of time when you are unable to move or be active. BEFORE THE PROCEDURE  If the spirometer includes an indicator to show your best effort, your nurse or respiratory therapist will set it to a desired goal. If possible, sit up straight or lean slightly forward. Try not to slouch. Hold the incentive spirometer in an upright position. INSTRUCTIONS FOR USE  Sit on the edge of your bed if possible, or sit up as far as you can in bed or on a chair. Hold the incentive spirometer in an upright position. Breathe out normally. Place the mouthpiece in your mouth and seal your lips tightly around it. Breathe in slowly and as deeply as possible, raising the piston or the ball toward the top of the column. Hold your breath for 3-5 seconds or for as long as possible. Allow the piston or ball to fall to the bottom of the column. Remove the mouthpiece from your mouth and breathe out normally. Rest for a few seconds and repeat Steps 1 through 7 at least 10 times every 1-2 hours when you are awake. Take your time and take a few normal breaths between deep breaths. The spirometer may include an indicator to show your best effort. Use the indicator as a goal to work toward during each repetition. After each set of 10 deep breaths, practice coughing to be sure your lungs are clear. If you have  an incision (the cut made at the time of surgery), support your incision when coughing by placing a pillow or rolled up towels firmly against it. Once you are able to get out of bed, walk around indoors and cough well. You may stop using the incentive spirometer when instructed by your caregiver.  RISKS AND COMPLICATIONS Take your time so you do not get dizzy or light-headed. If you are in pain, you may need to take or ask for pain medication before doing incentive spirometry. It is harder to take a deep breath if you are having pain. AFTER USE Rest and breathe slowly and easily. It can be helpful to keep track of a log of your progress. Your caregiver can provide you with a simple table to help with this. If you are using the spirometer at home, follow these instructions: [place] IF:  You are having difficultly using the spirometer. You have trouble using the spirometer as often as instructed. Your pain medication is not giving enough relief while using the spirometer. You develop fever of 100.5 F (38.1 C) or higher. SEEK IMMEDIATE MEDICAL CARE IF:  You cough up bloody sputum that had not been present before. You develop fever of 102 F (38.9 C) or greater. You develop worsening pain at or near the incision site. MAKE SURE YOU:  Understand these instructions. Will watch your condition. Will get help right away  if you are not doing well or get worse. Document Released: 09/08/2006 Document Revised: 07/21/2011 Document Reviewed: 11/09/2006 [employer] Patient Information 2014 [employer], [place].   ________________________________________________________________________  WHAT IS A BLOOD TRANSFUSION? Blood Transfusion Information  A transfusion is the replacement of blood or some of its parts. Blood is made up of multiple cells which provide different functions. Red blood cells carry oxygen and are used for blood loss replacement. White blood cells fight against  infection. Platelets control bleeding. Plasma helps clot blood. Other blood products are available for specialized needs, such as hemophilia or other clotting disorders. BEFORE THE TRANSFUSION  Who gives blood for transfusions?  Healthy volunteers who are fully evaluated to make sure their blood is safe. This is blood bank blood. Transfusion therapy is the safest it has ever been in the practice of medicine. Before blood is taken from a donor, a complete history is taken to make sure that person has no history of diseases nor engages in risky social behavior (examples are intravenous drug use or sexual activity with multiple partners). The donor's travel history is screened to minimize risk of transmitting infections, such as malaria. The donated blood is tested for signs of infectious diseases, such as HIV and hepatitis. The blood is then tested to be sure it is compatible with you in order to minimize the chance of a transfusion reaction. If you or a relative donates blood, this is often done in anticipation of surgery and is not appropriate for emergency situations. It takes many days to process the donated blood. RISKS AND COMPLICATIONS Although transfusion therapy is very safe and saves many lives, the main dangers of transfusion include:  Getting an infectious disease. Developing a transfusion reaction. This is an allergic reaction to something in the blood you were given. Every precaution is taken to prevent this. The decision to have a blood transfusion has been considered carefully by your caregiver before blood is given. Blood is not given unless the benefits outweigh the risks. AFTER THE TRANSFUSION Right after receiving a blood transfusion, you will usually feel much better and more energetic. This is especially true if your red blood cells have gotten low (anemic). The transfusion raises the level of the red blood cells which carry oxygen, and this usually causes an energy increase. The  nurse administering the transfusion will monitor you carefully for complications. HOME CARE INSTRUCTIONS  No special instructions are needed after a transfusion. You may find your energy is better. Speak with your caregiver about any limitations on activity for underlying diseases you may have. SEEK MEDICAL CARE IF:  Your condition is not improving after your transfusion. You develop redness or irritation at the intravenous (IV) site. SEEK IMMEDIATE MEDICAL CARE IF:  Any of the following symptoms occur over the next 12 hours: Shaking chills. You have a temperature by mouth above 102 F (38.9 C), not controlled by medicine. Chest, back, or muscle pain. People around you feel you are not acting correctly or are confused. Shortness of breath or difficulty breathing. Dizziness and fainting. You get a rash or develop hives. You have a decrease in urine output. Your urine turns a dark color or changes to pink, red, or brown. Any of the following symptoms occur over the next 10 days: You have a temperature by mouth above 102 F (38.9 C), not controlled by medicine. Shortness of breath. Weakness after normal activity. The white part of the eye turns yellow (jaundice). You have a decrease in the amount of urine  or are urinating less often. Your urine turns a dark color or changes to pink, red, or brown. Document Released: 04/25/2000 Document Revised: 07/21/2011 Document Reviewed: 12/13/2007 [facility] Patient Information 2014 [place], [place].  _______________________________________________________________________

## 2020-11-29 NOTE — Progress Notes (Addendum)
COVID Vaccine Completed: yes x2 Date COVID Vaccine completed: Has received booster: COVID vaccine manufacturer: Pfizer      Date of COVID positive in last 90 days: N/A  PCP - Gifford Shave, MD Cardiologist - N/A  Chest x-ray - N/A EKG - 11/30/20 Epic Stress Test - N/A ECHO - N/A Cardiac Cath - N/A Pacemaker/ICD device last checked:N/A Spinal Cord Stimulator: N/A  Sleep Study - yes positive  CPAP - no CPAP  Fasting Blood Sugar - 85-120 Checks Blood Sugar _1_ times a day  Blood Thinner Instructions: Aspirin Instructions: ASA 81mg  Last Dose: 12/03/20  Activity level: Can go up a flight of stairs and perform activities of daily living without stopping and without symptoms of chest pain or shortness of breath.        Anesthesia review: DM, OSA, HTN  Patient denies shortness of breath, fever, cough and chest pain at PAT appointment   Patient verbalized understanding of instructions that were given to them at the PAT appointment. Patient was also instructed that they will need to review over the PAT instructions again at home before surgery.

## 2020-11-30 ENCOUNTER — Encounter (HOSPITAL_COMMUNITY): Payer: Self-pay

## 2020-11-30 ENCOUNTER — Encounter (INDEPENDENT_AMBULATORY_CARE_PROVIDER_SITE_OTHER): Payer: Self-pay

## 2020-11-30 ENCOUNTER — Encounter (HOSPITAL_COMMUNITY)
Admission: RE | Admit: 2020-11-30 | Discharge: 2020-11-30 | Disposition: A | Discharge status: Home / Self Care | Payer: BC Managed Care – PPO | Source: Ambulatory Visit | Attending: Orthopedic Surgery | Admitting: Orthopedic Surgery

## 2020-11-30 ENCOUNTER — Other Ambulatory Visit: Payer: Self-pay

## 2020-11-30 DIAGNOSIS — Z01818 Encounter for other preprocedural examination: Secondary | ICD-10-CM | POA: Insufficient documentation

## 2020-11-30 LAB — CBC
HCT: 42.4 % (ref 39.0–52.0)
Hemoglobin: 13 g/dL (ref 13.0–17.0)
MCH: 25.2 pg — ABNORMAL LOW (ref 26.0–34.0)
MCHC: 30.7 g/dL (ref 30.0–36.0)
MCV: 82.2 fL (ref 80.0–100.0)
Platelets: 236 10*3/uL (ref 150–400)
RBC: 5.16 MIL/uL (ref 4.22–5.81)
RDW: 13.2 % (ref 11.5–15.5)
WBC: 4.6 10*3/uL (ref 4.0–10.5)
nRBC: 0 % (ref 0.0–0.2)

## 2020-11-30 LAB — COMPREHENSIVE METABOLIC PANEL
ALT: 16 U/L (ref 0–44)
AST: 18 U/L (ref 15–41)
Albumin: 4.2 g/dL (ref 3.5–5.0)
Alkaline Phosphatase: 62 U/L (ref 38–126)
Anion gap: 9 (ref 5–15)
BUN: 9 mg/dL (ref 8–23)
CO2: 28 mmol/L (ref 22–32)
Calcium: 9.4 mg/dL (ref 8.9–10.3)
Chloride: 104 mmol/L (ref 98–111)
Creatinine, Ser: 0.53 mg/dL — ABNORMAL LOW (ref 0.61–1.24)
GFR, Estimated: >60 mL/min (ref >60)
Glucose, Bld: 107 mg/dL — ABNORMAL HIGH (ref 70–99)
Potassium: 4.2 mmol/L (ref 3.5–5.1)
Sodium: 141 mmol/L (ref 135–145)
Total Bilirubin: 1 mg/dL (ref 0.3–1.2)
Total Protein: 8.4 g/dL — ABNORMAL HIGH (ref 6.5–8.1)

## 2020-11-30 LAB — SURGICAL PCR SCREEN
MRSA, PCR: NEGATIVE
Staphylococcus aureus: NEGATIVE

## 2020-11-30 LAB — PROTIME-INR
INR: 1.1 (ref 0.8–1.2)
Prothrombin Time: 13.7 seconds (ref 11.4–15.2)

## 2020-11-30 LAB — GLUCOSE, CAPILLARY: Glucose-Capillary: 100 mg/dL — ABNORMAL HIGH (ref 70–99)

## 2020-12-01 LAB — HEMOGLOBIN A1C
Hgb A1c MFr Bld: 6 % — ABNORMAL HIGH (ref 4.8–5.6)
Mean Plasma Glucose: 126 mg/dL

## 2020-12-06 ENCOUNTER — Other Ambulatory Visit (HOSPITAL_COMMUNITY)
Admission: RE | Admit: 2020-12-06 | Discharge: 2020-12-06 | Disposition: A | Discharge status: Home / Self Care | Payer: BC Managed Care – PPO | Source: Ambulatory Visit | Attending: Orthopedic Surgery | Admitting: Orthopedic Surgery

## 2020-12-06 DIAGNOSIS — Z01812 Encounter for preprocedural laboratory examination: Secondary | ICD-10-CM | POA: Insufficient documentation

## 2020-12-06 DIAGNOSIS — Z20822 Contact with and (suspected) exposure to covid-19: Secondary | ICD-10-CM | POA: Insufficient documentation

## 2020-12-06 LAB — SARS CORONAVIRUS 2 (TAT 6-24 HRS): SARS Coronavirus 2: NEGATIVE

## 2020-12-09 NOTE — H&P (Signed)
TOTAL HIP ADMISSION H&P  Patient is admitted for left total hip arthroplasty.  Subjective:  Chief Complaint: Left hip pain  HPI: Donald [name], 60 y.o. male, has a history of pain and functional disability in the left hip due to arthritis and patient has failed non-surgical conservative treatments for greater than 12 weeks to include NSAID's and/or analgesics and activity modification. Onset of symptoms was gradual, starting  several  years ago with gradually worsening course since that time. The patient noted no past surgery on the left hip. Patient currently rates pain in the left hip at 8 out of 10 with activity. Patient has worsening of pain with activity and weight bearing, pain that interfers with activities of daily living, and pain with passive range of motion. Patient has evidence of periarticular osteophytes and joint space narrowing by imaging studies. This condition presents safety issues increasing the risk of falls. There is no current active infection.  Patient Active Problem List   Diagnosis Date Noted   Hyperlipidemia    Osteoarthritis of left hip 07/05/2019   Routine health maintenance 07/05/2019   Sleep apnea 07/05/2019   Severe obesity (BMI >= 40) (H[facility]) 04/09/2013   Type 2 diabetes mellitus ([facility]) 04/01/2013   Iron deficiency anemia, unspecified 11/21/2010    Past Medical History:  Diagnosis Date   Anemia    Arthritis    DM (diabetes mellitus) ([facility])    Hyperlipidemia    Hypertension    Obesity    Sleep apnea    on CPAP    Past Surgical History:  Procedure Laterality Date   COLONOSCOPY  2009   FOOT SURGERY      Prior to Admission medications   Medication Sig Start Date End Date Taking? Authorizing Provider  acetaminophen (TYLENOL) 650 MG CR tablet Take 1,300 mg by mouth every 8 (eight) hours as needed for pain.   Yes [provider]  aspirin 81 MG tablet Take 81 mg by mouth daily.     Yes [provider]  meloxicam (MOBIC) 15 MG tablet  Take 15 mg by mouth daily. 09/15/20  Yes [provider]  metFORMIN (GLUCOPHAGE-XR) 500 MG 24 hr tablet TAKE 1 TABLET BY MOUTH TWICE A DAY Patient taking differently: Take 500 mg by mouth 2 (two) times daily. 10/17/20  Yes Gifford Shave, MD  Multiple Vitamin (MULTIVITAMIN) tablet Take 1 tablet by mouth daily.   Yes [provider]  rosuvastatin (CRESTOR) 5 MG tablet Take 1 tablet (5 mg total) by mouth daily. 03/02/19  Yes Sagardia, Ines Bloomer, MD  blood glucose meter kit and supplies KIT Dispense based on patient and insurance preference. Use up to four times daily as directed. (FOR ICD-9 250.00, 250.01). 03/28/16   Alveda Reasons, MD  blood glucose meter kit and supplies Dispense based on patient and insurance preference. Use up to four times daily as directed. (FOR ICD-9 250.00, 250.01). 03/28/16   [name], MD  cholecalciferol (VITAMIN D) 1000 UNITS tablet Take 1,000 Units by mouth daily. Patient not taking: Reported on 11/28/2020    [provider]  glucose blood (ONE TOUCH ULTRA TEST) test strip Use as directed 08/27/18   Forrest Moron, MD    No Known Allergies  Social History   Socioeconomic History   Marital status: Married    Spouse name: Not on file   Number of children: Not on file   Years of education: Not on file   Highest education level: Not on file  Occupational History   Occupation: Surveyor, quantity: [employer]  Tobacco Use   Smoking status: Never   Smokeless tobacco: Never  Vaping Use   Vaping Use: Never used  Substance and Sexual Activity   Alcohol use: No   Drug use: No   Sexual activity: Not on file  Other Topics Concern   Not on file  Social History Narrative   Exercise calisthenics   Social Determinants of Health   Financial Resource Strain: Not on file  Food Insecurity: Not on file  Transportation Needs: Not on file  Physical Activity: Not on file  Stress: Not on file  Social Connections: Not on file  Intimate  Partner Violence: Not on file    Tobacco Use: Low Risk    Smoking Tobacco Use: Never   Smokeless Tobacco Use: Never   Social History   Substance and Sexual Activity  Alcohol Use No    Family History  Problem Relation Age of Onset   Diabetes Sister 17   Colon cancer Neg Hx    Colon polyps Neg Hx    Esophageal cancer Neg Hx    Rectal cancer Neg Hx    Stomach cancer Neg Hx     ROS: Constitutional: no fever, no chills, no night sweats, no significant weight loss Cardiovascular: no chest pain, no palpitations Respiratory: no cough, no shortness of breath, No COPD Gastrointestinal: no vomiting, no nausea Musculoskeletal: no swelling in Joints, Joint Pain Neurologic: no numbness, no tingling, no difficulty with balance    Objective:  Physical Exam: Well nourished and well developed.  General: Alert and oriented x3, cooperative and pleasant, no acute distress.  Head: normocephalic, atraumatic, neck supple.  Eyes: EOMI.  Respiratory: breath sounds clear in all fields, no wheezing, rales, or rhonchi. Cardiovascular: Regular rate and rhythm, no murmurs, gallops or rubs.  Abdomen: non-tender to palpation and soft, normoactive bowel sounds. Musculoskeletal:  The patient is a pleasant, well-developed male alert and oriented in no apparent distress.  The patient has a significantly antalgic gait pattern favoring the left side without the use of assistive devices.  He is approximately 3/4 inch shorter on the left compared to the right.    Right Hip Exam:  The range of motion: Flexion to 110 degrees, Internal Rotation to 10 degrees, External Rotation to 30 degrees, and abduction to 30 degrees without discomfort.  There is no tenderness over the greater trochanteric bursa.  There is no pain on provocative testing of the hip.    Left Hip Exam:  The range of motion: Flexion to 80 degrees, Internal Rotation to 0 degrees, External Rotation to 0 degrees, and abduction to 10 degrees  without discomfort.  There is no tenderness over the greater trochanter bursa.    The patient's sensation and motor function are intact in their lower extremities.  Their distal pulses are 2+.  The bilateral calves are soft and non-tender.    Calves soft and nontender. Motor function intact in LE. Strength 5/5 LE bilaterally. Neuro: Distal pulses 2+. Sensation to light touch intact in LE.  Vital signs in last 24 hours:    Imaging Review Radiographs- AP pelvis, AP and lateral of the left hip dated 07/19/2020 demonstrate severe erosive end-stage arthritis in the left hip, completely bone on bone. It almost looks as though he may have a fracture line across the femoral neck, but when looking upon it more closely, it is more of a shadow. He has erosion also on the lateral view.  This has progressed in the past year compared to x-rays from 05/2019.   Assessment/Plan:  End stage arthritis, left hip  The patient history, physical examination, clinical judgement of the provider and imaging studies are consistent with end stage degenerative joint disease of the left hip and total hip arthroplasty is deemed medically necessary. The treatment options including medical management, injection therapy, arthroscopy and arthroplasty were discussed at length. The risks and benefits of total hip arthroplasty were presented and reviewed. The risks due to aseptic loosening, infection, stiffness, dislocation/subluxation, thromboembolic complications and other imponderables were discussed. The patient acknowledged the explanation, agreed to proceed with the plan and consent was signed. Patient is being admitted for inpatient treatment for surgery, pain control, PT, OT, prophylactic antibiotics, VTE prophylaxis, progressive ambulation and ADLs and discharge planning.The patient is planning to be discharged  home .   Patient's anticipated LOS is less than 2 midnights, meeting these requirements: - Younger than 76 -  Lives within 1 hour of care - Has a competent adult at home to recover with post-op recover - NO history of  - Chronic pain requiring opiods  - Diabetes  - Coronary Artery Disease  - Heart failure  - Heart attack  - Stroke  - DVT/VTE  - Cardiac arrhythmia  - Respiratory Failure/COPD  - Renal failure  - Anemia  - Advanced Liver disease  Therapy Plans: HEP Disposition: Home with wife Planned DVT Prophylaxis: Aspirin 332m  DME Needed: none PCP: Dr. CCaron Presume(clearance received) TXA: IV Allergies: None Anesthesia Concerns: None BMI: 40.2 Last HgbA1c: 6.1  [facility] [address]    - Patient was instructed on what medications to stop prior to surgery. - Follow-up visit in 2 weeks with Dr. AWynelle Link- Begin physical therapy following surgery - Pre-operative lab work as pre-surgical testing - Prescriptions will be provided in hospital at time of discharge  SFenton Foy [facility] PA-C Orthopedic Surgery [facility]

## 2020-12-10 ENCOUNTER — Encounter (HOSPITAL_COMMUNITY): Payer: Self-pay | Admitting: Orthopedic Surgery

## 2020-12-10 ENCOUNTER — Encounter (HOSPITAL_COMMUNITY)
Admission: RE | Disposition: A | Discharge status: Home / Self Care | Payer: Self-pay | Source: Home / Self Care | Attending: Orthopedic Surgery

## 2020-12-10 ENCOUNTER — Ambulatory Visit (HOSPITAL_COMMUNITY): Payer: BC Managed Care – PPO

## 2020-12-10 ENCOUNTER — Other Ambulatory Visit: Payer: Self-pay

## 2020-12-10 ENCOUNTER — Ambulatory Visit (HOSPITAL_COMMUNITY): Payer: BC Managed Care – PPO | Admitting: Physician Assistant

## 2020-12-10 ENCOUNTER — Observation Stay (HOSPITAL_COMMUNITY): Payer: BC Managed Care – PPO

## 2020-12-10 ENCOUNTER — Ambulatory Visit (HOSPITAL_COMMUNITY): Payer: BC Managed Care – PPO | Admitting: Anesthesiology

## 2020-12-10 ENCOUNTER — Observation Stay (HOSPITAL_COMMUNITY)
Admission: RE | Admit: 2020-12-10 | Discharge: 2020-12-17 | Disposition: A | Discharge status: Home / Self Care | Payer: BC Managed Care – PPO | Attending: Orthopedic Surgery | Admitting: Orthopedic Surgery

## 2020-12-10 DIAGNOSIS — Z419 Encounter for procedure for purposes other than remedying health state, unspecified: Secondary | ICD-10-CM

## 2020-12-10 DIAGNOSIS — E119 Type 2 diabetes mellitus without complications: Secondary | ICD-10-CM | POA: Diagnosis not present

## 2020-12-10 DIAGNOSIS — D509 Iron deficiency anemia, unspecified: Secondary | ICD-10-CM | POA: Diagnosis not present

## 2020-12-10 DIAGNOSIS — M1612 Unilateral primary osteoarthritis, left hip: Secondary | ICD-10-CM | POA: Diagnosis not present

## 2020-12-10 DIAGNOSIS — Z79899 Other long term (current) drug therapy: Secondary | ICD-10-CM | POA: Diagnosis not present

## 2020-12-10 DIAGNOSIS — I10 Essential (primary) hypertension: Secondary | ICD-10-CM | POA: Insufficient documentation

## 2020-12-10 DIAGNOSIS — Z7982 Long term (current) use of aspirin: Secondary | ICD-10-CM | POA: Insufficient documentation

## 2020-12-10 DIAGNOSIS — Z96642 Presence of left artificial hip joint: Secondary | ICD-10-CM | POA: Diagnosis not present

## 2020-12-10 DIAGNOSIS — Z7984 Long term (current) use of oral hypoglycemic drugs: Secondary | ICD-10-CM | POA: Diagnosis not present

## 2020-12-10 DIAGNOSIS — Z96649 Presence of unspecified artificial hip joint: Secondary | ICD-10-CM

## 2020-12-10 DIAGNOSIS — Z20822 Contact with and (suspected) exposure to covid-19: Secondary | ICD-10-CM | POA: Diagnosis not present

## 2020-12-10 DIAGNOSIS — Z471 Aftercare following joint replacement surgery: Secondary | ICD-10-CM | POA: Diagnosis not present

## 2020-12-10 HISTORY — PX: TOTAL HIP ARTHROPLASTY: SHX124

## 2020-12-10 LAB — GLUCOSE, CAPILLARY
Glucose-Capillary: 106 mg/dL — ABNORMAL HIGH (ref 70–99)
Glucose-Capillary: 105 mg/dL — ABNORMAL HIGH (ref 70–99)
Glucose-Capillary: 136 mg/dL — ABNORMAL HIGH (ref 70–99)
Glucose-Capillary: 177 mg/dL — ABNORMAL HIGH (ref 70–99)

## 2020-12-10 LAB — TYPE AND SCREEN
ABO/RH(D): O POS
Antibody Screen: NEGATIVE

## 2020-12-10 LAB — ABO/RH: ABO/RH(D): O POS

## 2020-12-10 SURGERY — ARTHROPLASTY, HIP, TOTAL, ANTERIOR APPROACH
Anesthesia: General | Site: Hip | Laterality: Left

## 2020-12-10 MED ORDER — TRANEXAMIC ACID-NACL 1000-0.7 MG/100ML-% IV SOLN
1000.0000 mg | INTRAVENOUS | Status: AC
  Administered 2020-12-10: 1000 mg via INTRAVENOUS
  Filled 2020-12-10: qty 100

## 2020-12-10 MED ORDER — ONDANSETRON HCL 4 MG/2ML IJ SOLN: 4.0000 mg | Freq: Four times a day (QID) | INTRAMUSCULAR | Status: DC | PRN

## 2020-12-10 MED ORDER — INSULIN ASPART 100 UNIT/ML IJ SOLN: 0.0000 [IU] | Freq: Every day | INTRAMUSCULAR | Status: DC

## 2020-12-10 MED ORDER — PROPOFOL 500 MG/50ML IV EMUL
INTRAVENOUS | Status: DC | PRN
  Administered 2020-12-10: 100 ug/kg/min via INTRAVENOUS

## 2020-12-10 MED ORDER — PHENYLEPHRINE 40 MCG/ML (10ML) SYRINGE FOR IV PUSH (FOR BLOOD PRESSURE SUPPORT)
PREFILLED_SYRINGE | INTRAVENOUS | Status: AC
  Filled 2020-12-10: qty 10

## 2020-12-10 MED ORDER — METOCLOPRAMIDE HCL 5 MG PO TABS: 5.0000 mg | ORAL_TABLET | Freq: Three times a day (TID) | ORAL | Status: DC | PRN

## 2020-12-10 MED ORDER — ALBUMIN HUMAN 5 % IV SOLN: INTRAVENOUS | Status: DC | PRN

## 2020-12-10 MED ORDER — INSULIN ASPART 100 UNIT/ML IJ SOLN
0.0000 [IU] | Freq: Three times a day (TID) | INTRAMUSCULAR | Status: DC
  Administered 2020-12-11 – 2020-12-12 (×2): 2 [IU] via SUBCUTANEOUS
  Administered 2020-12-14: 3 [IU] via SUBCUTANEOUS
  Administered 2020-12-15 – 2020-12-16 (×2): 2 [IU] via SUBCUTANEOUS

## 2020-12-10 MED ORDER — HYDROCODONE-ACETAMINOPHEN 7.5-325 MG PO TABS
1.0000 | ORAL_TABLET | ORAL | Status: DC | PRN
  Administered 2020-12-14: 1 via ORAL
  Administered 2020-12-16: 2 via ORAL
  Administered 2020-12-16: 1 via ORAL
  Filled 2020-12-10 (×2): qty 1
  Filled 2020-12-10: qty 2
  Filled 2020-12-10: qty 1

## 2020-12-10 MED ORDER — MIDAZOLAM HCL 2 MG/2ML IJ SOLN
INTRAMUSCULAR | Status: AC
  Filled 2020-12-10: qty 2

## 2020-12-10 MED ORDER — BUPIVACAINE HCL 0.25 % IJ SOLN
INTRAMUSCULAR | Status: DC | PRN
  Administered 2020-12-10: 30 mL

## 2020-12-10 MED ORDER — 0.9 % SODIUM CHLORIDE (POUR BTL) OPTIME
TOPICAL | Status: DC | PRN
  Administered 2020-12-10: 1000 mL

## 2020-12-10 MED ORDER — MIDAZOLAM HCL 5 MG/5ML IJ SOLN
INTRAMUSCULAR | Status: DC | PRN
  Administered 2020-12-10: 2 mg via INTRAVENOUS

## 2020-12-10 MED ORDER — MEPERIDINE HCL 50 MG/ML IJ SOLN: 6.2500 mg | INTRAMUSCULAR | Status: DC | PRN

## 2020-12-10 MED ORDER — ONDANSETRON HCL 4 MG PO TABS: 4.0000 mg | ORAL_TABLET | Freq: Four times a day (QID) | ORAL | Status: DC | PRN

## 2020-12-10 MED ORDER — DEXAMETHASONE SODIUM PHOSPHATE 10 MG/ML IJ SOLN
8.0000 mg | Freq: Once | INTRAMUSCULAR | Status: AC
  Administered 2020-12-10: 10 mg via INTRAVENOUS

## 2020-12-10 MED ORDER — HYDRALAZINE HCL 20 MG/ML IJ SOLN
INTRAMUSCULAR | Status: AC
  Filled 2020-12-10: qty 1

## 2020-12-10 MED ORDER — POVIDONE-IODINE 10 % EX SWAB
2.0000 "application " | Freq: Once | CUTANEOUS | Status: AC
  Administered 2020-12-10: 2 via TOPICAL

## 2020-12-10 MED ORDER — METHOCARBAMOL 500 MG IVPB - SIMPLE MED
500.0000 mg | Freq: Four times a day (QID) | INTRAVENOUS | Status: DC | PRN
  Filled 2020-12-10: qty 50

## 2020-12-10 MED ORDER — HYDROCODONE-ACETAMINOPHEN 5-325 MG PO TABS
1.0000 | ORAL_TABLET | ORAL | Status: DC | PRN
  Administered 2020-12-10 – 2020-12-12 (×7): 2 via ORAL
  Administered 2020-12-13 – 2020-12-14 (×4): 1 via ORAL
  Administered 2020-12-15 – 2020-12-17 (×5): 2 via ORAL
  Filled 2020-12-10 (×2): qty 1
  Filled 2020-12-10 (×4): qty 2
  Filled 2020-12-10: qty 1
  Filled 2020-12-10 (×8): qty 2
  Filled 2020-12-10: qty 1

## 2020-12-10 MED ORDER — PHENYLEPHRINE 40 MCG/ML (10ML) SYRINGE FOR IV PUSH (FOR BLOOD PRESSURE SUPPORT)
PREFILLED_SYRINGE | INTRAVENOUS | Status: DC | PRN
  Administered 2020-12-10: 120 ug via INTRAVENOUS

## 2020-12-10 MED ORDER — MENTHOL 3 MG MT LOZG: 1.0000 | LOZENGE | OROMUCOSAL | Status: DC | PRN

## 2020-12-10 MED ORDER — FENTANYL CITRATE (PF) 100 MCG/2ML IJ SOLN
INTRAMUSCULAR | Status: AC
  Filled 2020-12-10: qty 2

## 2020-12-10 MED ORDER — BISACODYL 10 MG RE SUPP: 10.0000 mg | Freq: Every day | RECTAL | Status: DC | PRN

## 2020-12-10 MED ORDER — SODIUM CHLORIDE (PF) 0.9 % IJ SOLN
INTRAMUSCULAR | Status: AC
  Filled 2020-12-10: qty 10

## 2020-12-10 MED ORDER — ASPIRIN EC 325 MG PO TBEC
325.0000 mg | DELAYED_RELEASE_TABLET | Freq: Two times a day (BID) | ORAL | Status: DC
  Administered 2020-12-11 – 2020-12-17 (×14): 325 mg via ORAL
  Filled 2020-12-10 (×14): qty 1

## 2020-12-10 MED ORDER — PROMETHAZINE HCL 25 MG/ML IJ SOLN: 6.2500 mg | INTRAMUSCULAR | Status: DC | PRN

## 2020-12-10 MED ORDER — ACETAMINOPHEN 325 MG PO TABS: 325.0000 mg | ORAL_TABLET | Freq: Four times a day (QID) | ORAL | Status: DC | PRN

## 2020-12-10 MED ORDER — POLYETHYLENE GLYCOL 3350 17 G PO PACK: 17.0000 g | PACK | Freq: Every day | ORAL | Status: DC | PRN

## 2020-12-10 MED ORDER — LACTATED RINGERS IV SOLN: INTRAVENOUS | Status: DC

## 2020-12-10 MED ORDER — CHLORHEXIDINE GLUCONATE 0.12 % MT SOLN
15.0000 mL | Freq: Once | OROMUCOSAL | Status: AC
  Administered 2020-12-10: 15 mL via OROMUCOSAL

## 2020-12-10 MED ORDER — SODIUM CHLORIDE 0.9 % IV SOLN
2.0000 g | INTRAVENOUS | Status: AC
  Administered 2020-12-10: 2 g via INTRAVENOUS
  Filled 2020-12-10: qty 2

## 2020-12-10 MED ORDER — ACETAMINOPHEN 10 MG/ML IV SOLN
1000.0000 mg | Freq: Four times a day (QID) | INTRAVENOUS | Status: DC
  Administered 2020-12-10: 1000 mg via INTRAVENOUS
  Filled 2020-12-10: qty 100

## 2020-12-10 MED ORDER — TRAMADOL HCL 50 MG PO TABS
50.0000 mg | ORAL_TABLET | Freq: Four times a day (QID) | ORAL | Status: DC | PRN
  Administered 2020-12-10: 50 mg via ORAL
  Filled 2020-12-10: qty 1

## 2020-12-10 MED ORDER — WATER FOR IRRIGATION, STERILE IR SOLN
Status: DC | PRN
  Administered 2020-12-10: 2000 mL

## 2020-12-10 MED ORDER — DOCUSATE SODIUM 100 MG PO CAPS
100.0000 mg | ORAL_CAPSULE | Freq: Two times a day (BID) | ORAL | Status: DC
  Administered 2020-12-10 – 2020-12-17 (×14): 100 mg via ORAL
  Filled 2020-12-10 (×15): qty 1

## 2020-12-10 MED ORDER — PHENOL 1.4 % MT LIQD: 1.0000 | OROMUCOSAL | Status: DC | PRN

## 2020-12-10 MED ORDER — ONDANSETRON HCL 4 MG/2ML IJ SOLN
INTRAMUSCULAR | Status: DC | PRN
  Administered 2020-12-10: 4 mg via INTRAVENOUS

## 2020-12-10 MED ORDER — METHOCARBAMOL 500 MG PO TABS
500.0000 mg | ORAL_TABLET | Freq: Four times a day (QID) | ORAL | Status: DC | PRN
  Administered 2020-12-11 – 2020-12-16 (×6): 500 mg via ORAL
  Filled 2020-12-10 (×6): qty 1

## 2020-12-10 MED ORDER — ORAL CARE MOUTH RINSE: 15.0000 mL | Freq: Once | OROMUCOSAL | Status: AC

## 2020-12-10 MED ORDER — BUPIVACAINE HCL (PF) 0.25 % IJ SOLN
INTRAMUSCULAR | Status: AC
  Filled 2020-12-10: qty 30

## 2020-12-10 MED ORDER — PROPOFOL 1000 MG/100ML IV EMUL
INTRAVENOUS | Status: AC
  Filled 2020-12-10: qty 100

## 2020-12-10 MED ORDER — METOCLOPRAMIDE HCL 5 MG/ML IJ SOLN: 5.0000 mg | Freq: Three times a day (TID) | INTRAMUSCULAR | Status: DC | PRN

## 2020-12-10 MED ORDER — SODIUM CHLORIDE 0.9 % IV SOLN: INTRAVENOUS | Status: DC

## 2020-12-10 MED ORDER — ROSUVASTATIN CALCIUM 5 MG PO TABS
5.0000 mg | ORAL_TABLET | Freq: Every day | ORAL | Status: DC
  Administered 2020-12-11 – 2020-12-17 (×7): 5 mg via ORAL
  Filled 2020-12-10 (×8): qty 1

## 2020-12-10 MED ORDER — MORPHINE SULFATE (PF) 4 MG/ML IV SOLN: 0.5000 mg | INTRAVENOUS | Status: DC | PRN

## 2020-12-10 MED ORDER — HYDRALAZINE HCL 20 MG/ML IJ SOLN
10.0000 mg | Freq: Four times a day (QID) | INTRAMUSCULAR | Status: DC | PRN
Start: 2020-12-10 — End: 2020-12-10
  Administered 2020-12-10: 10 mg via INTRAVENOUS

## 2020-12-10 MED ORDER — CEFAZOLIN SODIUM-DEXTROSE 2-4 GM/100ML-% IV SOLN
2.0000 g | Freq: Four times a day (QID) | INTRAVENOUS | Status: AC
  Administered 2020-12-10 – 2020-12-11 (×2): 2 g via INTRAVENOUS
  Filled 2020-12-10 (×2): qty 100

## 2020-12-10 MED ORDER — FENTANYL CITRATE (PF) 100 MCG/2ML IJ SOLN
INTRAMUSCULAR | Status: DC | PRN
  Administered 2020-12-10: 50 ug via INTRAVENOUS

## 2020-12-10 MED ORDER — PROPOFOL 500 MG/50ML IV EMUL
INTRAVENOUS | Status: AC
  Filled 2020-12-10: qty 50

## 2020-12-10 MED ORDER — HYDROMORPHONE HCL 1 MG/ML IJ SOLN: 0.2500 mg | INTRAMUSCULAR | Status: DC | PRN

## 2020-12-10 SURGICAL SUPPLY — 44 items
BAG COUNTER SPONGE SURGICOUNT (BAG) IMPLANT
BAG DECANTER FOR FLEXI CONT (MISCELLANEOUS) IMPLANT
BAG ZIPLOCK 12X15 (MISCELLANEOUS) IMPLANT
BALL HIP CERAMIC (Hips) ×1 IMPLANT
BLADE SAG 18X100X1.27 (BLADE) ×2 IMPLANT
COVER PERINEAL POST (MISCELLANEOUS) ×2 IMPLANT
COVER SURGICAL LIGHT HANDLE (MISCELLANEOUS) ×2 IMPLANT
CUP ACETBLR 52 OD PINNACLE (Hips) ×2 IMPLANT
DECANTER SPIKE VIAL GLASS SM (MISCELLANEOUS) ×2 IMPLANT
DERMABOND ADVANCED (GAUZE/BANDAGES/DRESSINGS) ×1
DERMABOND ADVANCED .7 DNX12 (GAUZE/BANDAGES/DRESSINGS) ×1 IMPLANT
DRAPE FOOT SWITCH (DRAPES) ×2 IMPLANT
DRAPE STERI IOBAN 125X83 (DRAPES) ×2 IMPLANT
DRAPE U-SHAPE 47X51 STRL (DRAPES) ×4 IMPLANT
DRSG AQUACEL AG ADV 3.5X10 (GAUZE/BANDAGES/DRESSINGS) ×2 IMPLANT
DURAPREP 26ML APPLICATOR (WOUND CARE) ×2 IMPLANT
ELECT REM PT RETURN 15FT ADLT (MISCELLANEOUS) ×2 IMPLANT
GLOVE SRG 8 PF TXTR STRL LF DI (GLOVE) ×1 IMPLANT
GLOVE SURG ENC MOIS LTX SZ6.5 (GLOVE) ×2 IMPLANT
GLOVE SURG ENC MOIS LTX SZ7 (GLOVE) ×2 IMPLANT
GLOVE SURG ENC MOIS LTX SZ8 (GLOVE) ×4 IMPLANT
GLOVE SURG UNDER POLY LF SZ7 (GLOVE) ×2 IMPLANT
GLOVE SURG UNDER POLY LF SZ8 (GLOVE) ×2
GLOVE SURG UNDER POLY LF SZ8.5 (GLOVE) IMPLANT
GOWN STRL REUS W/TWL LRG LVL3 (GOWN DISPOSABLE) ×4 IMPLANT
GOWN STRL REUS W/TWL XL LVL3 (GOWN DISPOSABLE) IMPLANT
HIP BALL CERAMIC (Hips) ×2 IMPLANT
HOLDER FOLEY CATH W/STRAP (MISCELLANEOUS) ×2 IMPLANT
KIT TURNOVER KIT A (KITS) ×2 IMPLANT
LINER MARATHON NEUT +4X52X32 (Hips) ×2 IMPLANT
MANIFOLD NEPTUNE II (INSTRUMENTS) ×2 IMPLANT
PACK ANTERIOR HIP CUSTOM (KITS) ×2 IMPLANT
PENCIL SMOKE EVACUATOR COATED (MISCELLANEOUS) ×2 IMPLANT
STEM FEMORAL SZ5 HIGH ACTIS (Stem) ×2 IMPLANT
STRIP CLOSURE SKIN 1/2X4 (GAUZE/BANDAGES/DRESSINGS) ×2 IMPLANT
SUT ETHIBOND NAB CT1 #1 30IN (SUTURE) ×2 IMPLANT
SUT MNCRL AB 4-0 PS2 18 (SUTURE) ×2 IMPLANT
SUT STRATAFIX 0 PDS 27 VIOLET (SUTURE) ×2
SUT VIC AB 2-0 CT1 27 (SUTURE) ×4
SUT VIC AB 2-0 CT1 TAPERPNT 27 (SUTURE) ×2 IMPLANT
SUTURE STRATFX 0 PDS 27 VIOLET (SUTURE) ×1 IMPLANT
SYR 50ML LL SCALE MARK (SYRINGE) IMPLANT
TRAY FOLEY MTR SLVR 16FR STAT (SET/KITS/TRAYS/PACK) ×2 IMPLANT
TUBE SUCTION HIGH CAP CLEAR NV (SUCTIONS) ×2 IMPLANT

## 2020-12-10 NOTE — Discharge Instructions (Signed)
Donald Arabian, MD Total Joint Specialist [facility] [address], [address] [place], [place] [address] [phone]  ANTERIOR APPROACH TOTAL HIP REPLACEMENT POSTOPERATIVE DIRECTIONS     Hip Rehabilitation, Guidelines Following Surgery  The results of a hip operation are greatly improved after range of motion and muscle strengthening exercises. Follow all safety measures which are given to protect your hip. If any of these exercises cause increased pain or swelling in your joint, decrease the amount until you are comfortable again. Then slowly increase the exercises. Call your caregiver if you have problems or questions.   BLOOD CLOT PREVENTION Take a 325 mg Aspirin two times a day for three weeks following surgery. Then resume one 81 mg Aspirin once a day. You may resume your vitamins/supplements upon discharge from the hospital. Do not take any NSAIDs (Advil, Aleve, Ibuprofen, Meloxicam, etc.) until you have discontinued the 325 mg Aspirin.  HOME CARE INSTRUCTIONS  Remove items at home which could result in a fall. This includes throw rugs or furniture in walking pathways.  ICE to the affected hip as frequently as 20-30 minutes an hour and then as needed for pain and swelling. Continue to use ice on the hip for pain and swelling from surgery. You may notice swelling that will progress down to the foot and ankle. This is normal after surgery. Elevate the leg when you are not up walking on it.   Continue to use the breathing machine which will help keep your temperature down.  It is common for your temperature to cycle up and down following surgery, especially at night when you are not up moving around and exerting yourself.  The breathing machine keeps your lungs expanded and your temperature down.  DIET You may resume your previous home diet once your are discharged from the hospital.  DRESSING / WOUND CARE / SHOWERING You have an adhesive waterproof bandage over the  incision. Leave this in place until your first follow-up appointment. Once you remove this you will not need to place another bandage.  You may begin showering 3 days following surgery, but do not submerge the incision under water.  ACTIVITY For the first 3-5 days, it is important to rest and keep the operative leg elevated. You should, as a general rule, rest for 50 minutes and walk/stretch for 10 minutes per hour. After 5 days, you may slowly increase activity as tolerated.  Perform the exercises you were provided twice a day for about 15-20 minutes each session. Begin these 2 days following surgery. Walk with your walker as instructed. Use the walker until you are comfortable transitioning to a cane. Walk with the cane in the opposite hand of the operative leg. You may discontinue the cane once you are comfortable and walking steadily. Avoid periods of inactivity such as sitting longer than an hour when not asleep. This helps prevent blood clots.  Do not drive a car for 6 weeks or until released by your surgeon.  Do not drive while taking narcotics.  TED HOSE STOCKINGS Wear the elastic stockings on both legs for three weeks following surgery during the day. You may remove them at night while sleeping.  WEIGHT BEARING Weight bearing as tolerated with assist device (walker, cane, etc) as directed, use it as long as suggested by your surgeon or therapist, typically at least 4-6 weeks.  POSTOPERATIVE CONSTIPATION PROTOCOL Constipation - defined medically as fewer than three stools per week and severe constipation as less than one stool per week.  less than one stool per week.  One of the most common issues patients have following surgery is constipation.  Even if you have a regular bowel pattern at home, your normal regimen is likely to be disrupted due to multiple reasons following surgery.  Combination of anesthesia, postoperative narcotics, change in appetite and fluid intake all can  affect your bowels.  In order to avoid complications following surgery, here are some recommendations in order to help you during your recovery period.  Colace (docusate) - Pick up an over-the-counter form of Colace or another stool softener and take twice a day as long as you are requiring postoperative pain medications.  Take with a full glass of water daily.  If you experience loose stools or diarrhea, hold the colace until you stool forms back up.  If your symptoms do not get better within 1 week or if they get worse, check with your doctor. Dulcolax (bisacodyl) - Pick up over-the-counter and take as directed by the product packaging as needed to assist with the movement of your bowels.  Take with a full glass of water.  Use this product as needed if not relieved by Colace only.  MiraLax (polyethylene glycol) - Pick up over-the-counter to have on hand.  MiraLax is a solution that will increase the amount of water in your bowels to assist with bowel movements.  Take as directed and can mix with a glass of water, juice, soda, coffee, or tea.  Take if you go more than two days without a movement.Do not use MiraLax more than once per day. Call your doctor if you are still constipated or irregular after using this medication for 7 days in a row.  If you continue to have problems with postoperative constipation, please contact the office for further assistance and recommendations.  If you experience "the worst abdominal pain ever" or develop nausea or vomiting, please contact the office immediatly for further recommendations for treatment.  ITCHING  If you experience itching with your medications, try taking only a single pain pill, or even half a pain pill at a time.  You can also use Benadryl over the counter for itching or also to help with sleep.   MEDICATIONS See your medication summary on the "After Visit Summary" that the nursing staff will review with you prior to discharge.  You may have some home  medications which will be placed on hold until you complete the course of blood thinner medication.  It is important for you to complete the blood thinner medication as prescribed by your surgeon.  Continue your approved medications as instructed at time of discharge.  PRECAUTIONS If you experience chest pain or shortness of breath - call 911 immediately for transfer to the hospital emergency department.  If you develop a fever greater that 101 F, purulent drainage from wound, increased redness or drainage from wound, foul odor from the wound/dressing, or calf pain - CONTACT YOUR SURGEON.                                                   FOLLOW-UP APPOINTMENTS Make sure you keep all of your appointments after your operation with your surgeon and caregivers. You should call the office at the above phone number and make an appointment for approximately two weeks after the date of your surgery or on the   date instructed by your surgeon outlined in the "After Visit Summary".  RANGE OF MOTION AND STRENGTHENING EXERCISES  These exercises are designed to help you keep full movement of your hip joint. Follow your caregiver's or physical therapist's instructions. Perform all exercises about fifteen times, three times per day or as directed. Exercise both hips, even if you have had only one joint replacement. These exercises can be done on a training (exercise) mat, on the floor, on a table or on a bed. Use whatever works the best and is most comfortable for you. Use music or television while you are exercising so that the exercises are a pleasant break in your day. This will make your life better with the exercises acting as a break in routine you can look forward to.  Lying on your back, slowly slide your foot toward your buttocks, raising your knee up off the floor. Then slowly slide your foot back down until your leg is straight again.  Lying on your back spread your legs as far apart as you can without causing  discomfort.  Lying on your side, raise your upper leg and foot straight up from the floor as far as is comfortable. Slowly lower the leg and repeat.  Lying on your back, tighten up the muscle in the front of your thigh (quadriceps muscles). You can do this by keeping your leg straight and trying to raise your heel off the floor. This helps strengthen the largest muscle supporting your knee.  Lying on your back, tighten up the muscles of your buttocks both with the legs straight and with the knee bent at a comfortable angle while keeping your heel on the floor.   POST-OPERATIVE OPIOID TAPER INSTRUCTIONS: It is important to wean off of your opioid medication as soon as possible. If you do not need pain medication after your surgery it is ok to stop day one. Opioids include: Codeine, Hydrocodone(Norco, Vicodin), Oxycodone(Percocet, oxycontin) and hydromorphone amongst others.  Long term and even short term use of opiods can cause: Increased pain response Dependence Constipation Depression Respiratory depression And more.  Withdrawal symptoms can include Flu like symptoms Nausea, vomiting And more Techniques to manage these symptoms Hydrate well Eat regular healthy meals Stay active Use relaxation techniques(deep breathing, meditating, yoga) Do Not substitute Alcohol to help with tapering If you have been on opioids for less than two weeks and do not have pain than it is ok to stop all together.  Plan to wean off of opioids This plan should start within one week post op of your joint replacement. Maintain the same interval or time between taking each dose and first decrease the dose.  Cut the total daily intake of opioids by one tablet each day Next start to increase the time between doses. The last dose that should be eliminated is the evening dose.   IF YOU ARE TRANSFERRED TO A SKILLED REHAB FACILITY If the patient is transferred to a skilled rehab facility following release from the  hospital, a list of the current medications will be sent to the facility for the patient to continue.  When discharged from the skilled rehab facility, please have the facility set up the patient's Home Health Physical Therapy prior to being released. Also, the skilled facility will be responsible for providing the patient with their medications at time of release from the facility to include their pain medication, the muscle relaxants, and their blood thinner medication. If the patient is still at the rehab facility   up appointment, the skilled rehab facility will also need to assist the patient in arranging follow up appointment in our office and any transportation needs.  MAKE SURE YOU:  Understand these instructions.  Get help right away if you are not doing well or get worse.    DENTAL ANTIBIOTICS:  In most cases prophylactic antibiotics for Dental procdeures after total joint surgery are not necessary.  Exceptions are as follows:  1. History of prior total joint infection  2. Severely immunocompromised (Organ Transplant, cancer chemotherapy, Rheumatoid biologic meds such as Humera)  3. Poorly controlled diabetes (A1C &gt; 8.0, blood glucose over 200)  If you have one of these conditions, contact your surgeon for an antibiotic prescription, prior to your dental procedure.    Pick up stool softner and laxative for home use following surgery while on pain medications. Do not submerge incision under water. Please use good hand washing techniques while changing dressing each day. May shower starting three days after surgery. Please use a clean towel to pat the incision dry following showers. Continue to use ice for pain and swelling after surgery. Do not use any lotions or creams on the incision until instructed by your surgeon.  

## 2020-12-10 NOTE — Progress Notes (Signed)
Pt admitted to room. Alert and oriented x4. Oriented to room and staff. Pain med given for pain.

## 2020-12-10 NOTE — Plan of Care (Signed)
  Problem: Pain Management: Goal: Pain level will decrease with appropriate interventions Outcome: Progressing   Problem: Activity: Goal: Ability to avoid complications of mobility impairment will improve Outcome: Progressing Goal: Ability to tolerate increased activity will improve Outcome: Progressing   

## 2020-12-10 NOTE — Op Note (Signed)
OPERATIVE REPORT- TOTAL HIP ARTHROPLASTY   PREOPERATIVE DIAGNOSIS: Osteoarthritis of the Left hip.   POSTOPERATIVE DIAGNOSIS: Osteoarthritis of the Left  hip.   PROCEDURE: Left total hip arthroplasty, anterior approach.   SURGEON: Gaynelle Arabian, MD   ASSISTANT: Molli Barrows, PA-C  ANESTHESIA:   Adductor canal block and spinal  ESTIMATED BLOOD LOSS:-300 mL    DRAINS: Hemovac x1.   COMPLICATIONS: None   CONDITION: PACU - hemodynamically stable.   BRIEF CLINICAL NOTE: Donald [name] is a 60 y.o. male who has advanced end-  stage arthritis of their Left  hip with progressively worsening pain and  dysfunction.The patient has failed nonoperative management and presents for  total hip arthroplasty.   PROCEDURE IN DETAIL: After successful administration of spinal  anesthetic, the traction boots for the [facility] bed were placed on both  feet and the patient was placed onto the [facility] bed, boots placed into the leg  holders. The Left hip was then isolated from the perineum with plastic  drapes and prepped and draped in the usual sterile fashion. ASIS and  greater trochanter were marked and a oblique incision was made, starting  at about 1 cm lateral and 2 cm distal to the ASIS and coursing towards  the anterior cortex of the femur. The skin was cut with a 10 blade  through subcutaneous tissue to the level of the fascia overlying the  tensor fascia lata muscle. The fascia was then incised in line with the  incision at the junction of the anterior third and posterior 2/3rd. The  muscle was teased off the fascia and then the interval between the TFL  and the rectus was developed. The Hohmann retractor was then placed at  the top of the femoral neck over the capsule. The vessels overlying the  capsule were cauterized and the fat on top of the capsule was removed.  A Hohmann retractor was then placed anterior underneath the rectus  femoris to give exposure to the entire anterior capsule. A  T-shaped  capsulotomy was performed. The edges were tagged and the femoral head  was identified.       Osteophytes are removed off the superior acetabulum.  The femoral neck was then cut in situ with an oscillating saw. Traction  was then applied to the left lower extremity utilizing the [facility]  traction. The femoral head was then removed. Retractors were placed  around the acetabulum and then circumferential removal of the labrum was  performed. Osteophytes were also removed. Reaming starts at 47 mm to  medialize and  Increased in 2 mm increments to 51 mm. We reamed in  approximately 40 degrees of abduction, 20 degrees anteversion. A 52 mm  pinnacle acetabular shell was then impacted in anatomic position under  fluoroscopic guidance with excellent purchase. We did not need to place  any additional dome screws. A 32 mm neutral + 4 marathon liner was then  placed into the acetabular shell.       The femoral lift was then placed along the lateral aspect of the femur  just distal to the vastus ridge. The leg was  externally rotated and capsule  was stripped off the inferior aspect of the femoral neck down to the  level of the lesser trochanter, this was done with electrocautery. The femur was lifted after this was performed. The  leg was then placed in an extended and adducted position essentially delivering the femur. We also removed the capsule superiorly and the piriformis from the  piriformis fossa to gain excellent exposure of the  proximal femur. Rongeur was used to remove some cancellous bone to get  into the lateral portion of the proximal femur for placement of the  initial starter reamer. The starter broaches was placed  the starter broach  and was shown to go down the center of the canal. Broaching  with the Corail system was then performed starting at size 8  coursing  Up to size 5. A size 5 had excellent torsional and rotational  and axial stability. The trial high offset neck was  then placed  with a 32 + 5 trial head. The hip was then reduced. We confirmed that  the stem was in the canal both on AP and lateral x-rays. It also has excellent sizing. The hip was reduced with outstanding stability through full extension and full external rotation.. AP pelvis was taken and the leg lengths were measured and found to be equal. Hip was then dislocated again and the femoral head and neck removed. The  femoral broach was removed. Size 5 Corail stem with a high offset  neck was then impacted into the femur following native anteversion. Has  excellent purchase in the canal. Excellent torsional and rotational and  axial stability. It is confirmed to be in the canal on AP and lateral  fluoroscopic views. The 32 + 5 ceramic head was placed and the hip  reduced with outstanding stability. Again AP pelvis was taken and it  confirmed that the leg lengths were equal. The wound was then copiously  irrigated with saline solution and the capsule reattached and repaired  with Ethibond suture. 30 ml of .25% Bupivicaine was  injected into the capsule and into the edge of the tensor fascia lata as well as subcutaneous tissue. The fascia overlying the tensor fascia lata was then closed with a running #1 V-Loc. Subcu was closed with interrupted 2-0 Vicryl and subcuticular running 4-0 Monocryl. Incision was cleaned  and dried. Steri-Strips and a bulky sterile dressing applied. Hemovac  drain was hooked to suction and then the patient was awakened and transported to  recovery in stable condition.        Please note that a surgical assistant was a medical necessity for this procedure to perform it in a safe and expeditious manner. Assistant was necessary to provide appropriate retraction of vital neurovascular structures and to prevent femoral fracture and allow for anatomic placement of the prosthesis.  Gaynelle Arabian, M.D.

## 2020-12-10 NOTE — Interval H&P Note (Signed)
History and Physical Interval Note:  12/10/2020 12:41 PM  Donald [name]  has presented today for surgery, with the diagnosis of left hip osteoarthritis.  The various methods of treatment have been discussed with the patient and family. After consideration of risks, benefits and other options for treatment, the patient has consented to  Procedure(s): TOTAL HIP ARTHROPLASTY ANTERIOR APPROACH (Left) as a surgical intervention.  The patient's history has been reviewed, patient examined, no change in status, stable for surgery.  I have reviewed the patient's chart and labs.  Questions were answered to the patient's satisfaction.     Pilar Plate Ahnika Hannibal

## 2020-12-10 NOTE — Transfer of Care (Signed)
Immediate Anesthesia Transfer of Care Note  Patient: Donald [name]  Procedure(s) Performed: TOTAL HIP ARTHROPLASTY ANTERIOR APPROACH (Left: Hip)  Patient Location: PACU  Anesthesia Type:Spinal  Level of Consciousness: awake  Airway & Oxygen Therapy: Patient Spontanous Breathing and Patient connected to face mask oxygen  Post-op Assessment: Report given to RN and Post -op Vital signs reviewed and stable  Post vital signs: Reviewed and stable  Last Vitals:  Vitals Value Taken Time  BP 114/69 12/10/20 1540  Temp    Pulse 78 12/10/20 1542  Resp 14 12/10/20 1542  SpO2 100 % 12/10/20 1542  Vitals shown include unvalidated device data.  Last Pain:  Vitals:   12/10/20 1156  TempSrc:   PainSc: 7       Patients Stated Pain Goal: 5 ([id number])  Complications: No notable events documented.

## 2020-12-10 NOTE — Anesthesia Procedure Notes (Signed)
Spinal  Patient location during procedure: OR Reason for block: surgical anesthesia Staffing Performed: anesthesiologist  Anesthesiologist: Nolon Nations, MD Preanesthetic Checklist Completed: patient identified, IV checked, site marked, risks and benefits discussed, surgical consent, monitors and equipment checked, pre-op evaluation and timeout performed Spinal Block Patient position: left lateral decubitus Prep: DuraPrep and site prepped and draped Patient monitoring: heart rate, continuous pulse ox and blood pressure Approach: right paramedian Location: L3-4 Injection technique: single-shot Needle Needle type: Spinocan  Needle gauge: 25 G Needle length: 9 cm Additional Notes Expiration date of kit checked and confirmed. Patient tolerated procedure well, without complications.

## 2020-12-10 NOTE — Anesthesia Postprocedure Evaluation (Addendum)
Anesthesia Post Note  Patient: Donald [name]  Procedure(s) Performed: TOTAL HIP ARTHROPLASTY ANTERIOR APPROACH (Left: Hip)     Patient location during evaluation: PACU Anesthesia Type: Spinal Level of consciousness: patient cooperative Pain management: pain level controlled Vital Signs Assessment: post-procedure vital signs reviewed and stable Respiratory status: spontaneous breathing Cardiovascular status: stable Anesthetic complications: no   No notable events documented.  Last Vitals:  Vitals:   12/10/20 1800 12/10/20 1824  BP: (!) 149/97 (!) 157/98  Pulse: 72 80  Resp: 12 16  Temp: 36.5 C 36.5 C  SpO2: 98% 100%    Last Pain:  Vitals:   12/10/20 2010  TempSrc:   PainSc: 5     LLE Motor Response: Purposeful movement (12/10/20 2005) LLE Sensation: Full sensation (12/10/20 2005) RLE Motor Response: Purposeful movement (12/10/20 2005) RLE Sensation: Full sensation (12/10/20 2005) L Sensory Level: S1-Sole of foot, small toes (12/10/20 2005) R Sensory Level: S1-Sole of foot, small toes (12/10/20 2005)  Nolon Nations

## 2020-12-10 NOTE — Anesthesia Preprocedure Evaluation (Addendum)
Anesthesia Evaluation  Patient identified by MRN, date of birth, ID band Patient awake    Reviewed: Allergy & Precautions, NPO status , Patient's Chart, lab work & pertinent test results  Airway Mallampati: II  TM Distance: >3 FB Neck ROM: Full    Dental  (+) Missing, Dental Advisory Given, Poor Dentition   Pulmonary sleep apnea ,    breath sounds clear to auscultation       Cardiovascular hypertension,  Rhythm:Regular Rate:Normal     Neuro/Psych negative neurological ROS     GI/Hepatic negative GI ROS, Neg liver ROS,   Endo/Other  diabetes, Type 2Morbid obesity  Renal/GU negative Renal ROS     Musculoskeletal  (+) Arthritis ,   Abdominal (+) + obese,   Peds  Hematology  (+) Blood dyscrasia, anemia ,   Anesthesia Other Findings   Reproductive/Obstetrics                            Anesthesia Physical Anesthesia Plan  ASA: 3  Anesthesia Plan: Spinal   Post-op Pain Management:    Induction: Intravenous  PONV Risk Score and Plan: 4 or greater and Treatment may vary due to age or medical condition, Ondansetron, Dexamethasone, Propofol infusion and Midazolam  Airway Management Planned:   Additional Equipment: None  Intra-op Plan:   Post-operative Plan:   Informed Consent: I have reviewed the patients History and Physical, chart, labs and discussed the procedure including the risks, benefits and alternatives for the proposed anesthesia with the patient or authorized representative who has indicated his/her understanding and acceptance.     Dental advisory given  Plan Discussed with: CRNA  Anesthesia Plan Comments:       Anesthesia Quick Evaluation

## 2020-12-10 NOTE — Anesthesia Procedure Notes (Signed)
Procedure Name: MAC Date/Time: 12/10/2020 2:10 PM Performed by: Jari Pigg, CRNA Pre-anesthesia Checklist: Emergency Drugs available, Patient identified, Suction available and Patient being monitored Patient Re-evaluated:Patient Re-evaluated prior to induction Oxygen Delivery Method: Simple face mask

## 2020-12-11 DIAGNOSIS — Z20822 Contact with and (suspected) exposure to covid-19: Secondary | ICD-10-CM | POA: Diagnosis not present

## 2020-12-11 DIAGNOSIS — I10 Essential (primary) hypertension: Secondary | ICD-10-CM | POA: Diagnosis not present

## 2020-12-11 DIAGNOSIS — Z7982 Long term (current) use of aspirin: Secondary | ICD-10-CM | POA: Diagnosis not present

## 2020-12-11 DIAGNOSIS — Z79899 Other long term (current) drug therapy: Secondary | ICD-10-CM | POA: Diagnosis not present

## 2020-12-11 DIAGNOSIS — Z7984 Long term (current) use of oral hypoglycemic drugs: Secondary | ICD-10-CM | POA: Diagnosis not present

## 2020-12-11 DIAGNOSIS — E785 Hyperlipidemia, unspecified: Secondary | ICD-10-CM | POA: Diagnosis not present

## 2020-12-11 DIAGNOSIS — E119 Type 2 diabetes mellitus without complications: Secondary | ICD-10-CM | POA: Diagnosis not present

## 2020-12-11 DIAGNOSIS — M1612 Unilateral primary osteoarthritis, left hip: Secondary | ICD-10-CM | POA: Diagnosis not present

## 2020-12-11 LAB — BASIC METABOLIC PANEL
Anion gap: 8 (ref 5–15)
BUN: 11 mg/dL (ref 8–23)
CO2: 26 mmol/L (ref 22–32)
Calcium: 8.7 mg/dL — ABNORMAL LOW (ref 8.9–10.3)
Chloride: 101 mmol/L (ref 98–111)
Creatinine, Ser: 0.71 mg/dL (ref 0.61–1.24)
GFR, Estimated: >60 mL/min (ref >60)
Glucose, Bld: 149 mg/dL — ABNORMAL HIGH (ref 70–99)
Potassium: 4.3 mmol/L (ref 3.5–5.1)
Sodium: 135 mmol/L (ref 135–145)

## 2020-12-11 LAB — CBC
HCT: 37.7 % — ABNORMAL LOW (ref 39.0–52.0)
Hemoglobin: 11.7 g/dL — ABNORMAL LOW (ref 13.0–17.0)
MCH: 25.5 pg — ABNORMAL LOW (ref 26.0–34.0)
MCHC: 31 g/dL (ref 30.0–36.0)
MCV: 82.1 fL (ref 80.0–100.0)
Platelets: 208 10*3/uL (ref 150–400)
RBC: 4.59 MIL/uL (ref 4.22–5.81)
RDW: 13.1 % (ref 11.5–15.5)
WBC: 10.7 10*3/uL — ABNORMAL HIGH (ref 4.0–10.5)
nRBC: 0 % (ref 0.0–0.2)

## 2020-12-11 LAB — GLUCOSE, CAPILLARY
Glucose-Capillary: 120 mg/dL — ABNORMAL HIGH (ref 70–99)
Glucose-Capillary: 128 mg/dL — ABNORMAL HIGH (ref 70–99)
Glucose-Capillary: 110 mg/dL — ABNORMAL HIGH (ref 70–99)
Glucose-Capillary: 126 mg/dL — ABNORMAL HIGH (ref 70–99)

## 2020-12-11 MED ORDER — HYDROCODONE-ACETAMINOPHEN 5-325 MG PO TABS: 1.0000 | ORAL_TABLET | Freq: Four times a day (QID) | ORAL | 0 refills | Status: DC | PRN

## 2020-12-11 MED ORDER — TRAMADOL HCL 50 MG PO TABS: 50.0000 mg | ORAL_TABLET | Freq: Four times a day (QID) | ORAL | 0 refills | Status: DC | PRN

## 2020-12-11 MED ORDER — ASPIRIN 325 MG PO TBEC: 325.0000 mg | DELAYED_RELEASE_TABLET | Freq: Two times a day (BID) | ORAL | 0 refills | Status: DC

## 2020-12-11 MED ORDER — METHOCARBAMOL 500 MG PO TABS: 500.0000 mg | ORAL_TABLET | Freq: Four times a day (QID) | ORAL | 0 refills | Status: DC | PRN

## 2020-12-11 NOTE — Evaluation (Signed)
Physical Therapy Evaluation Patient Details Name: Donald [name] MRN: [id number] DOB: 01/19/1961 Today's Date: 12/11/2020   History of Present Illness  60 yo male s/p R L DA THA PMH: DM, sleep apnea, HTn, obesity  Clinical Impression  Pt is s/p THA resulting in the deficits listed below (see PT Problem List).  Pt requiring overall mod assist for bed mobility and transfers from elevated surface. Poor sitting balance, 2 recent falls and limited assist at home. Would benefit from HHPT however it is preferred pt d/c with no further PT. Do not feel pt will be ready to d/c today.  Pt will benefit from skilled PT to increase their independence and safety with mobility to allow discharge to the venue listed below.      Follow Up Recommendations Follow surgeon's recommendation for DC plan and follow-up therapies (HEP)    Equipment Recommendations  Rolling walker with 5" wheels;3in1 (PT)    Recommendations for Other Services       Precautions / Restrictions Precautions Precautions: Fall Restrictions Weight Bearing Restrictions: No Other Position/Activity Restrictions: WBAT      Mobility  Bed Mobility Overal bed mobility: Needs Assistance Bed Mobility: Supine to Sit     Supine to sit: Mod assist     General bed mobility comments: assist with LEs and trunk. incr time and effort    Transfers Overall transfer level: Needs assistance Equipment used: Rolling walker (2 wheeled) Transfers: Sit to/from Stand Sit to Stand: Mod assist;+2 safety/equipment         General transfer comment: multi-modal cues for hand placement, foot position and wt shift. incr time, assist to rise and transition to RW, posterior bias on initial standing  Ambulation/Gait Ambulation/Gait assistance: Min assist Gait Distance (Feet): 30 Feet Assistive device: Rolling walker (2 wheeled) Gait Pattern/deviations: Step-to pattern;Decreased stance time - left     General Gait Details: cues for sequence and RW  position from self. assist to balance while wt shifting  Stairs            Wheelchair Mobility    Modified Rankin (Stroke Patients Only)       Balance Overall balance assessment: History of Falls (most recent fall 2 wks agai) Sitting-balance support: Feet supported Sitting balance-Leahy Scale: Poor Sitting balance - Comments: with incr time and surface ht elevation pt able to transition to Fair balance with incr time, close supervision Postural control: Posterior lean   Standing balance-Leahy Scale: Poor Standing balance comment: reliant on UEs and external support                             Pertinent Vitals/Pain Pain Assessment: Faces Faces Pain Scale: Hurts little more Pain Location: L hip Pain Descriptors / Indicators: Grimacing;Discomfort;Sore Pain Intervention(s): Limited activity within patient's tolerance;Monitored during session;Premedicated before session;Repositioned    Home Living Family/patient expects to be discharged to:: Private residence Living Arrangements: Spouse/significant other   Type of Home: House Home Access: Level entry     Home Layout: One level Home Equipment: None      Prior Function Level of Independence: Independent;Independent with assistive device(s)         Comments: amb with cane     Hand Dominance        Extremity/Trunk Assessment   Upper Extremity Assessment Upper Extremity Assessment: Overall WFL for tasks assessed    Lower Extremity Assessment Lower Extremity Assessment: LLE deficits/detail;Generalized weakness LLE Deficits / Details: grossly 2+ to 3/5  hip, knee 3+/5 LLE Coordination: decreased gross motor       Communication   Communication: No difficulties  Cognition Arousal/Alertness: Awake/alert Behavior During Therapy: WFL for tasks assessed/performed Overall Cognitive Status: Within Functional Limits for tasks assessed                                        General  Comments      Exercises Total Joint Exercises Ankle Circles/Pumps: AROM;Both;10 reps   Assessment/Plan    PT Assessment Patient needs continued PT services  PT Problem List Decreased strength;Decreased mobility;Decreased activity tolerance;Decreased knowledge of use of DME;Decreased balance       PT Treatment Interventions DME instruction;Therapeutic activities;Gait training;Functional mobility training;Therapeutic exercise;Patient/family education    PT Goals (Current goals can be found in the Care Plan section)  Acute Rehab PT Goals Patient Stated Goal: home soon PT Goal Formulation: With patient Time For Goal Achievement: 12/25/20 Potential to Achieve Goals: Good    Frequency 7X/week   Barriers to discharge        Co-evaluation               AM-PAC PT "6 Clicks" Mobility  Outcome Measure Help needed turning from your back to your side while in a flat bed without using bedrails?: A Little Help needed moving from lying on your back to sitting on the side of a flat bed without using bedrails?: A Little Help needed moving to and from a bed to a chair (including a wheelchair)?: A Little Help needed standing up from a chair using your arms (e.g., wheelchair or bedside chair)?: A Little Help needed to walk in hospital room?: A Little Help needed climbing 3-5 steps with a railing? : A Lot 6 Click Score: 17    End of Session Equipment Utilized During Treatment: Gait belt Activity Tolerance: Patient tolerated treatment well Patient left: in chair;with call bell/phone within reach;with chair alarm set Nurse Communication: Mobility status PT Visit Diagnosis: Other abnormalities of gait and mobility (R26.89);Difficulty in walking, not elsewhere classified (R26.2);Muscle weakness (generalized) (M62.81)    Time: [id number] PT Time Calculation (min) (ACUTE ONLY): 26 min   Charges:   PT Evaluation $PT Eval Low Complexity: 1 Low PT Treatments $Gait Training: 8-22 mins         Baxter Flattery, PT  Acute Rehab Dept ([place]) [phone] Pager [phone]  12/11/2020   [facility] 12/11/2020, 12:02 PM

## 2020-12-11 NOTE — TOC Transition Note (Signed)
Transition of Care [facility]) - CM/SW Discharge Note   Patient Details  Name: Donald [name] MRN: [id number] Date of Birth: 03/08/1961  [facility] CM/SW Contact:  Lennart Pall, LCSW Phone Number: 12/11/2020, 10:53 AM   Clinical Narrative:     Met with pt to confirm dc needs. Pt states that he does not have any DME so orders are placed for rolling walker and 3n1 commode - referral made with [facility] for delivery to pt's room prior to discharge.  No further TOC needs.  Final next level of care: Home/Self Care Barriers to Discharge: No Barriers Identified   Patient Goals and CMS Choice Patient states their goals for this hospitalization and ongoing recovery are:: return home      Discharge Placement                       Discharge Plan and Services                DME Arranged: 3-N-1, Walker rolling DME Agency: [employer] Date DME Agency Contacted: 12/11/20 Time DME Agency Contacted: [id number] Representative spoke with at DME Agency: [place] ([place]) Interventions     Readmission Risk Interventions No flowsheet data found.

## 2020-12-11 NOTE — Plan of Care (Signed)
  Problem: Pain Management: Goal: Pain level will decrease with appropriate interventions 12/11/2020 0715 by Mayme Genta, RN Outcome: Progressing 12/10/2020 1838 by Mayme Genta, RN Outcome: Progressing   Problem: Activity: Goal: Ability to avoid complications of mobility impairment will improve 12/11/2020 0715 by Mayme Genta, RN Outcome: Progressing 12/10/2020 1838 by Mayme Genta, RN Outcome: Progressing Goal: Ability to tolerate increased activity will improve 12/11/2020 0715 by Mayme Genta, RN Outcome: Progressing 12/10/2020 1838 by Mayme Genta, RN Outcome: Progressing

## 2020-12-11 NOTE — Progress Notes (Signed)
12/11/20 1500  PT Visit Information  Last PT Received On 12/11/20  Assistance Needed +2 (progression)  Continues to require incr assist for mobility. Continue PT POC   History of Present Illness 60 yo male s/p R L DA THA PMH: DM, sleep apnea, HTn, obesity  Subjective Data  Patient Stated Goal home soon  Precautions  Precautions Fall  Restrictions  Weight Bearing Restrictions No  Other Position/Activity Restrictions WBAT  Pain Assessment  Pain Assessment Faces  Faces Pain Scale 6  Pain Location L hip  Pain Descriptors / Indicators Grimacing;Discomfort;Sore  Pain Intervention(s) Limited activity within patient's tolerance;Monitored during session;Premedicated before session;Repositioned  Cognition  Arousal/Alertness Awake/alert  Behavior During Therapy WFL for tasks assessed/performed  Overall Cognitive Status Within Functional Limits for tasks assessed  Bed Mobility  Overal bed mobility Needs Assistance  Bed Mobility Sit to Supine  Sit to supine Mod assist  General bed mobility comments assist with LEs and trunk. incr time and effort  Transfers  Overall transfer level Needs assistance  Equipment used Rolling walker (2 wheeled)  Transfers Sit to/from Stand  Sit to Stand Mod assist  General transfer comment multi-modal cues for hand placement, foot position and wt shift. incr time, assist to rise and transition to RW, posterior bias on initial standing  Ambulation/Gait  Ambulation/Gait assistance Min assist  Gait Distance (Feet) 6 Feet  Assistive device Rolling walker (2 wheeled)  Gait Pattern/deviations Step-to pattern;Decreased stance time - left  General Gait Details cues for sequence and RW position from self. assist to balance while wt shifting, limited by pain  Balance  Sitting balance-Leahy Scale Poor  Standing balance-Leahy Scale Poor  Standing balance comment reliant on UEs and external support  Total Joint Exercises  Ankle Circles/Pumps AROM;Both;10 reps   Quad Sets AROM;Both;10 reps;Limitations  Heel Slides AROM;10 reps;Left  Hip ABduction/ADduction AAROM;Left;10 reps  Quad Sets Limitations limited hip and knee extension d/t pain and guarding  PT - End of Session  Equipment Utilized During Treatment Gait belt  Activity Tolerance Patient tolerated treatment well  Patient left in bed;with call bell/phone within reach;with bed alarm set  Nurse Communication Mobility status   PT - Assessment/Plan  PT Plan Current plan remains appropriate  PT Visit Diagnosis Other abnormalities of gait and mobility (R26.89);Difficulty in walking, not elsewhere classified (R26.2);Muscle weakness (generalized) (M62.81)  PT Frequency (ACUTE ONLY) 7X/week  Follow Up Recommendations Follow surgeon's recommendation for DC plan and follow-up therapies;Supervision for mobility/OOB (HEP)  PT equipment Rolling walker with 5" wheels;3in1 (PT)  AM-PAC PT "6 Clicks" Mobility Outcome Measure (Version 2)  Help needed turning from your back to your side while in a flat bed without using bedrails? 2  Help needed moving from lying on your back to sitting on the side of a flat bed without using bedrails? 2  Help needed moving to and from a bed to a chair (including a wheelchair)? 2  Help needed standing up from a chair using your arms (e.g., wheelchair or bedside chair)? 2  Help needed to walk in hospital room? 2  Help needed climbing 3-5 steps with a railing?  1  6 Click Score 11  Consider Recommendation of Discharge To: CIR/SNF/LTACH  PT Goal Progression  Progress towards PT goals Progressing toward goals  Acute Rehab PT Goals  PT Goal Formulation With patient  Time For Goal Achievement 12/25/20  Potential to Achieve Goals Good  PT Time Calculation  PT Start Time (ACUTE ONLY) 1448  PT Stop Time (ACUTE ONLY)  1502  PT Time Calculation (min) (ACUTE ONLY) 14 min  PT Treatments  $Therapeutic Exercise 8-22 mins

## 2020-12-11 NOTE — Progress Notes (Signed)
   Subjective: 1 Day Post-Op Procedure(s) (LRB): TOTAL HIP ARTHROPLASTY ANTERIOR APPROACH (Left) Patient reports pain as mild.   Patient seen in rounds by Dr. Wynelle Link. Patient is well, and has had no acute complaints or problems other than pain in the left thigh. Denies chest pain or SOB. No issues overnight.  We will begin therapy today.   Objective: Vital signs in last 24 hours: Temp:  [97.7 F (36.5 C)-99.6 F (37.6 C)] 98.9 F (37.2 C) (08/02 0429) Pulse Rate:  [65-99] 99 (08/02 0429) Resp:  [12-18] 17 (08/02 0429) BP: (113-190)/(78-122) 161/98 (08/02 0429) SpO2:  [96 %-100 %] 96 % (08/02 0429) Weight:  [115.2 kg] 115.2 kg (08/01 2217)  Intake/Output from previous day:  Intake/Output Summary (Last 24 hours) at 12/11/2020 0749 Last data filed at 12/11/2020 0555 Gross per 24 hour  Intake 3097.78 ml  Output 2250 ml  Net 847.78 ml     Intake/Output this shift: No intake/output data recorded.  Labs: Recent Labs    12/11/20 0311  HGB 11.7*   Recent Labs    12/11/20 0311  WBC 10.7*  RBC 4.59  HCT 37.7*  PLT 208   Recent Labs    12/11/20 0311  NA 135  K 4.3  CL 101  CO2 26  BUN 11  CREATININE 0.71  GLUCOSE 149*  CALCIUM 8.7*   No results for input(s): LABPT, INR in the last 72 hours.  Exam: General - Patient is Alert and Oriented Extremity - Neurologically intact Neurovascular intact Sensation intact distally Dorsiflexion/Plantar flexion intact Dressing - dressing C/D/I Motor Function - intact, moving foot and toes well on exam.   Past Medical History:  Diagnosis Date   Anemia    Arthritis    DM (diabetes mellitus) ([place])    Hyperlipidemia    Hypertension    Obesity    Sleep apnea    on CPAP    Assessment/Plan: 1 Day Post-Op Procedure(s) (LRB): TOTAL HIP ARTHROPLASTY ANTERIOR APPROACH (Left) Active Problems:   Primary osteoarthritis of left hip  Estimated body mass index is 39.78 kg/m as calculated from the following:   Height as of  this encounter: '5\' 7"'$  (1.702 m).   Weight as of this encounter: 115.2 kg. Advance diet Up with therapy D/C IV fluids  DVT Prophylaxis - Aspirin Weight bearing as tolerated. Begin therapy.  Plan is to go Home after hospital stay. Plan for discharge later today if cleared by PT with HEP Follow-up in the office in 2 weeks  The PDMP database was reviewed today prior to any opioid medications being prescribed to this patient.  Theresa Duty, PA-C Orthopedic Surgery [phone] 12/11/2020, 7:49 AM

## 2020-12-12 ENCOUNTER — Encounter (HOSPITAL_COMMUNITY): Payer: Self-pay | Admitting: Orthopedic Surgery

## 2020-12-12 DIAGNOSIS — M1612 Unilateral primary osteoarthritis, left hip: Secondary | ICD-10-CM | POA: Diagnosis not present

## 2020-12-12 DIAGNOSIS — Z7982 Long term (current) use of aspirin: Secondary | ICD-10-CM | POA: Diagnosis not present

## 2020-12-12 DIAGNOSIS — Z20822 Contact with and (suspected) exposure to covid-19: Secondary | ICD-10-CM | POA: Diagnosis not present

## 2020-12-12 DIAGNOSIS — Z7984 Long term (current) use of oral hypoglycemic drugs: Secondary | ICD-10-CM | POA: Diagnosis not present

## 2020-12-12 DIAGNOSIS — E119 Type 2 diabetes mellitus without complications: Secondary | ICD-10-CM | POA: Diagnosis not present

## 2020-12-12 DIAGNOSIS — I10 Essential (primary) hypertension: Secondary | ICD-10-CM | POA: Diagnosis not present

## 2020-12-12 DIAGNOSIS — Z79899 Other long term (current) drug therapy: Secondary | ICD-10-CM | POA: Diagnosis not present

## 2020-12-12 LAB — BASIC METABOLIC PANEL
Anion gap: 8 (ref 5–15)
BUN: 8 mg/dL (ref 8–23)
CO2: 25 mmol/L (ref 22–32)
Calcium: 8.7 mg/dL — ABNORMAL LOW (ref 8.9–10.3)
Chloride: 104 mmol/L (ref 98–111)
Creatinine, Ser: 0.6 mg/dL — ABNORMAL LOW (ref 0.61–1.24)
GFR, Estimated: >60 mL/min (ref >60)
Glucose, Bld: 121 mg/dL — ABNORMAL HIGH (ref 70–99)
Potassium: 3.8 mmol/L (ref 3.5–5.1)
Sodium: 137 mmol/L (ref 135–145)

## 2020-12-12 LAB — CBC
HCT: 38.1 % — ABNORMAL LOW (ref 39.0–52.0)
Hemoglobin: 11.7 g/dL — ABNORMAL LOW (ref 13.0–17.0)
MCH: 25.4 pg — ABNORMAL LOW (ref 26.0–34.0)
MCHC: 30.7 g/dL (ref 30.0–36.0)
MCV: 82.6 fL (ref 80.0–100.0)
Platelets: 190 10*3/uL (ref 150–400)
RBC: 4.61 MIL/uL (ref 4.22–5.81)
RDW: 13.3 % (ref 11.5–15.5)
WBC: 9.7 10*3/uL (ref 4.0–10.5)
nRBC: 0 % (ref 0.0–0.2)

## 2020-12-12 LAB — GLUCOSE, CAPILLARY
Glucose-Capillary: 113 mg/dL — ABNORMAL HIGH (ref 70–99)
Glucose-Capillary: 123 mg/dL — ABNORMAL HIGH (ref 70–99)
Glucose-Capillary: 119 mg/dL — ABNORMAL HIGH (ref 70–99)
Glucose-Capillary: 126 mg/dL — ABNORMAL HIGH (ref 70–99)

## 2020-12-12 NOTE — Plan of Care (Signed)
  Problem: Pain Management: Goal: Pain level will decrease with appropriate interventions Outcome: Progressing   Problem: Activity: Goal: Risk for activity intolerance will decrease Outcome: Progressing   

## 2020-12-12 NOTE — Progress Notes (Signed)
12/12/20 1500  PT Visit Information  Last PT Received On 12/12/20  Assistance Needed +1 Pt is progressing toward goals. Very motivated to work with PT. Session focused on bed mobility and sit<>stand transfers/safety and HEP review. Decr assist overall. Continue PT POC.   History of Present Illness 60 yo male s/p R L DA THA PMH: DM, sleep apnea, HTn, obesity  Subjective Data  Patient Stated Goal home soon  Precautions  Precautions Fall  Restrictions  Other Position/Activity Restrictions WBAT  Pain Assessment  Pain Assessment Faces  Faces Pain Scale 6  Pain Location L hip  Pain Descriptors / Indicators Grimacing;Discomfort;Sore  Pain Intervention(s) Limited activity within patient's tolerance;Monitored during session;Premedicated before session;Repositioned  Cognition  Arousal/Alertness Awake/alert  Behavior During Therapy WFL for tasks assessed/performed  Overall Cognitive Status Within Functional Limits for tasks assessed  Bed Mobility  Overal bed mobility Needs Assistance  Bed Mobility Supine to Sit;Sit to Supine  Supine to sit Min assist;HOB elevated  Sit to supine Min assist  General bed mobility comments incr time and effort. assist with LLE off bed and to elevate trunk. incr overall pt effort. pt able to begin self assist using gait belt as leg lifter  Transfers  Overall transfer level Needs assistance  Equipment used Rolling walker (2 wheeled)  Transfers Sit to/from Stand  Sit to Stand Min assist;Min guard  General transfer comment repeated sit<>stand trials for strengthening. verbal cues for hand placement, foot position and anterior -superior wt shift. incr time, assist to rise and transition to RW, slight posterior bias on initial standing requring min/guard to min assist to prevent posterior LOB.  Ambulation/Gait  Ambulation/Gait assistance Min assist;Min guard  Assistive device Rolling walker (2 wheeled)  Gait Pattern/deviations Step-to pattern;Decreased stance  time - left  General Gait Details lateral steps along EOB for repositioning  Balance  Sitting balance-Leahy Scale Fair  Sitting balance - Comments with incr time and surface ht elevation pt able to transition to Fair balance, close supervision  Postural control Posterior lean  Standing balance-Leahy Scale Poor  Standing balance comment reliant on UEs and external support  Total Joint Exercises  Ankle Circles/Pumps AROM;Both;10 reps  Quad Sets AROM;Both;10 reps;Limitations  Heel Slides AAROM;AROM;Left;10 reps  Hip ABduction/ADduction AAROM;Left;10 reps  Quad Sets Limitations unable to fully extend L knee and hip d/t pain, decr ROM, incr muscle guarding  PT - End of Session  Equipment Utilized During Treatment Gait belt  Activity Tolerance Patient tolerated treatment well  Patient left with call bell/phone within reach;in bed;with bed alarm set  Nurse Communication Mobility status   PT - Assessment/Plan  PT Plan Current plan remains appropriate  PT Visit Diagnosis Other abnormalities of gait and mobility (R26.89);Difficulty in walking, not elsewhere classified (R26.2);Muscle weakness (generalized) (M62.81)  PT Frequency (ACUTE ONLY) 7X/week  PT equipment Rolling walker with 5" wheels;3in1 (PT)  AM-PAC PT "6 Clicks" Mobility Outcome Measure (Version 2)  Help needed turning from your back to your side while in a flat bed without using bedrails? 2  Help needed moving from lying on your back to sitting on the side of a flat bed without using bedrails? 2  Help needed moving to and from a bed to a chair (including a wheelchair)? 3  Help needed standing up from a chair using your arms (e.g., wheelchair or bedside chair)? 3  Help needed to walk in hospital room? 3  Help needed climbing 3-5 steps with a railing?  2  6 Click Score 15  Consider Recommendation of Discharge To: CIR/SNF/LTACH  PT Goal Progression  Progress towards PT goals Progressing toward goals  Acute Rehab PT Goals  PT Goal  Formulation With patient  Time For Goal Achievement 12/25/20  Potential to Achieve Goals Good  PT Time Calculation  PT Start Time (ACUTE ONLY) 1441  PT Stop Time (ACUTE ONLY) 1515  PT Time Calculation (min) (ACUTE ONLY) 34 min  PT General Charges  $$ ACUTE PT VISIT 1 Visit  PT Treatments  $Therapeutic Exercise 8-22 mins  $Therapeutic Activity 8-22 mins

## 2020-12-12 NOTE — Plan of Care (Signed)
  Problem: Activity: Goal: Ability to avoid complications of mobility impairment will improve Outcome: Progressing   Problem: Clinical Measurements: Goal: Postoperative complications will be avoided or minimized Outcome: Progressing   Problem: Pain Management: Goal: Pain level will decrease with appropriate interventions Outcome: Progressing   

## 2020-12-12 NOTE — Progress Notes (Signed)
   Subjective: 2 Days Post-Op Procedure(s) (LRB): TOTAL HIP ARTHROPLASTY ANTERIOR APPROACH (Left) Patient reports pain as mild.   Patient seen in rounds by Dr. Wynelle Link. Patient is feeling better this AM, pain is improved. Was not meeting goals with physical therapy yesterday. Denies chest pain or SOB. Plan is to go Home after hospital stay.  Objective: Vital signs in last 24 hours: Temp:  [98.5 F (36.9 C)-99 F (37.2 C)] 98.5 F (36.9 C) (08/03 0522) Pulse Rate:  [97-109] 107 (08/03 0522) Resp:  [16-18] 16 (08/03 0522) BP: (155-170)/(87-100) 162/100 (08/03 0522) SpO2:  [94 %-99 %] 97 % (08/03 0522)  Intake/Output from previous day:  Intake/Output Summary (Last 24 hours) at 12/12/2020 0807 Last data filed at 12/12/2020 0600 Gross per 24 hour  Intake 600 ml  Output 1150 ml  Net -550 ml    Intake/Output this shift: No intake/output data recorded.  Labs: Recent Labs    12/11/20 0311 12/12/20 0310  HGB 11.7* 11.7*   Recent Labs    12/11/20 0311 12/12/20 0310  WBC 10.7* 9.7  RBC 4.59 4.61  HCT 37.7* 38.1*  PLT 208 190   Recent Labs    12/11/20 0311 12/12/20 0310  NA 135 137  K 4.3 3.8  CL 101 104  CO2 26 25  BUN 11 8  CREATININE 0.71 0.60*  GLUCOSE 149* 121*  CALCIUM 8.7* 8.7*   No results for input(s): LABPT, INR in the last 72 hours.  Exam: General - Patient is Alert and Oriented Extremity - Neurologically intact Neurovascular intact Sensation intact distally Dorsiflexion/Plantar flexion intact Dressing/Incision - clean, dry, no drainage Motor Function - intact, moving foot and toes well on exam.   Past Medical History:  Diagnosis Date   Anemia    Arthritis    DM (diabetes mellitus) ([place])    Hyperlipidemia    Hypertension    Obesity    Sleep apnea    on CPAP    Assessment/Plan: 2 Days Post-Op Procedure(s) (LRB): TOTAL HIP ARTHROPLASTY ANTERIOR APPROACH (Left) Active Problems:   Primary osteoarthritis of left hip  Estimated body mass  index is 39.78 kg/m as calculated from the following:   Height as of this encounter: '5\' 7"'$  (1.702 m).   Weight as of this encounter: 115.2 kg. Up with therapy  DVT Prophylaxis - Aspirin Weight-bearing as tolerated  Plan for discharge to home once cleared by PT  Theresa Duty, PA-C Orthopedic Surgery [phone] 12/12/2020, 8:07 AM

## 2020-12-12 NOTE — Progress Notes (Signed)
Physical Therapy Treatment Patient Details Name: Donald [name] MRN: [id number] DOB: [birth year] Today's Date: 12/12/2020    History of Present Illness 60 yo male s/p R L DA THA PMH: DM, sleep apnea, HTn, obesity    PT Comments     Pt is progressing toward goals however still requires assist with bed mobility, transfers and gait. Pt with posterior bias upon sitting and initial standing requiring assist to prevent fall.  Recommend continued PT to work on balance, safety with mobility and fall prevention.  Follow Up Recommendations   HEP per MD      Equipment Recommendations  Rolling walker with 5" wheels;3in1 (PT)    Recommendations for Other Services       Precautions / Restrictions Precautions Precautions: Fall Restrictions Weight Bearing Restrictions: No Other Position/Activity Restrictions: WBAT    Mobility  Bed Mobility Overal bed mobility: Needs Assistance Bed Mobility: Supine to Sit     Supine to sit: Min assist;HOB elevated     General bed mobility comments: incr time and effort. use of rail, partial roll to pt left, assist with LLE and to elevate trunk    Transfers Overall transfer level: Needs assistance Equipment used: Rolling walker (2 wheeled) Transfers: Sit to/from Stand Sit to Stand: Min assist         General transfer comment: verbal cues for hand placement, foot position and wt shift. incr time, assist to rise and transition to RW, posterior bias on initial standing requring assist to prevent posterior LOB  Ambulation/Gait Ambulation/Gait assistance: Min assist Gait Distance (Feet): 50 Feet Assistive device: Rolling walker (2 wheeled) Gait Pattern/deviations: Step-to pattern;Decreased stance time - left     General Gait Details: cues for sequence and RW position from self. assist to balance while wt shifting. improved ability to advance LLE after ~ 10'   Stairs             Wheelchair Mobility    Modified Rankin (Stroke Patients  Only)       Balance     Sitting balance-Leahy Scale: Fair Sitting balance - Comments: with incr time and surface ht elevation pt able to transition to Fair balance, close supervision Postural control: Posterior lean   Standing balance-Leahy Scale: Poor Standing balance comment: reliant on UEs and external support                            Cognition Arousal/Alertness: Awake/alert Behavior During Therapy: WFL for tasks assessed/performed Overall Cognitive Status: Within Functional Limits for tasks assessed                                        Exercises      General Comments        Pertinent Vitals/Pain Pain Assessment: Faces Faces Pain Scale: Hurts even more Pain Location: L hip Pain Descriptors / Indicators: Grimacing;Discomfort;Sore Pain Intervention(s): Limited activity within patient's tolerance;Monitored during session;Premedicated before session;Repositioned;Ice applied    Home Living                      Prior Function            PT Goals (current goals can now be found in the care plan section) Acute Rehab PT Goals Patient Stated Goal: home soon PT Goal Formulation: With patient Time For Goal Achievement: 12/25/20 Potential to Achieve Goals:  Good Progress towards PT goals: Progressing toward goals    Frequency    7X/week      PT Plan Current plan remains appropriate    Co-evaluation              AM-PAC PT "6 Clicks" Mobility   Outcome Measure  Help needed turning from your back to your side while in a flat bed without using bedrails?: A Lot Help needed moving from lying on your back to sitting on the side of a flat bed without using bedrails?: A Lot Help needed moving to and from a bed to a chair (including a wheelchair)?: A Little Help needed standing up from a chair using your arms (e.g., wheelchair or bedside chair)?: A Little Help needed to walk in hospital room?: A Little Help needed climbing  3-5 steps with a railing? : A Lot 6 Click Score: 15    End of Session Equipment Utilized During Treatment: Gait belt Activity Tolerance: Patient tolerated treatment well Patient left: in chair;with call bell/phone within reach;with chair alarm set Nurse Communication: Mobility status PT Visit Diagnosis: Other abnormalities of gait and mobility (R26.89);Difficulty in walking, not elsewhere classified (R26.2);Muscle weakness (generalized) (M62.81)     Time: [id number] PT Time Calculation (min) (ACUTE ONLY): 24 min  Charges:  $Gait Training: 23-37 mins                     Baxter Flattery, PT  Acute Rehab Dept ([place]) [phone] Pager [phone]  12/12/2020    [facility] 12/12/2020, 12:57 PM

## 2020-12-13 DIAGNOSIS — Z7984 Long term (current) use of oral hypoglycemic drugs: Secondary | ICD-10-CM | POA: Diagnosis not present

## 2020-12-13 DIAGNOSIS — Z20822 Contact with and (suspected) exposure to covid-19: Secondary | ICD-10-CM | POA: Diagnosis not present

## 2020-12-13 DIAGNOSIS — M1612 Unilateral primary osteoarthritis, left hip: Secondary | ICD-10-CM | POA: Diagnosis not present

## 2020-12-13 DIAGNOSIS — I10 Essential (primary) hypertension: Secondary | ICD-10-CM | POA: Diagnosis not present

## 2020-12-13 DIAGNOSIS — Z79899 Other long term (current) drug therapy: Secondary | ICD-10-CM | POA: Diagnosis not present

## 2020-12-13 DIAGNOSIS — E119 Type 2 diabetes mellitus without complications: Secondary | ICD-10-CM | POA: Diagnosis not present

## 2020-12-13 DIAGNOSIS — Z7982 Long term (current) use of aspirin: Secondary | ICD-10-CM | POA: Diagnosis not present

## 2020-12-13 LAB — CBC
HCT: 36.3 % — ABNORMAL LOW (ref 39.0–52.0)
Hemoglobin: 11.3 g/dL — ABNORMAL LOW (ref 13.0–17.0)
MCH: 25.5 pg — ABNORMAL LOW (ref 26.0–34.0)
MCHC: 31.1 g/dL (ref 30.0–36.0)
MCV: 81.9 fL (ref 80.0–100.0)
Platelets: 202 10*3/uL (ref 150–400)
RBC: 4.43 MIL/uL (ref 4.22–5.81)
RDW: 13.3 % (ref 11.5–15.5)
WBC: 10.1 10*3/uL (ref 4.0–10.5)
nRBC: 0 % (ref 0.0–0.2)

## 2020-12-13 LAB — GLUCOSE, CAPILLARY
Glucose-Capillary: 99 mg/dL (ref 70–99)
Glucose-Capillary: 113 mg/dL — ABNORMAL HIGH (ref 70–99)
Glucose-Capillary: 114 mg/dL — ABNORMAL HIGH (ref 70–99)
Glucose-Capillary: 108 mg/dL — ABNORMAL HIGH (ref 70–99)

## 2020-12-13 MED ORDER — SODIUM CHLORIDE 0.9 % IV BOLUS
500.0000 mL | Freq: Once | INTRAVENOUS | Status: AC
  Administered 2020-12-13: 500 mL via INTRAVENOUS

## 2020-12-13 NOTE — Progress Notes (Signed)
Physical Therapy Treatment Patient Details Name: Donald [name] MRN: [id number] DOB: Oct 08, 1960 Today's Date: 12/13/2020    History of Present Illness 60 yo male s/p R L DA THA PMH: DM, sleep apnea, HTn, obesity    PT Comments    Patient is making slow progress and is requiring min-mod assist for bed mobility and transfers with RW from elevated surface. He required min assist for gait and was able to increase ambulation distance to ~80 feet but requires repeated cues for improved safe positioning of RW and posture. Patient does not have any help at home as he lives alone and is not safe to discharge home without assist. Recommending ST rehab at SNF to progress mobility to safe level of independence prior to returning home to reduce risk of falls.    Follow Up Recommendations  SNF (pt lives alone and is requiring increased assist for bed mobility and transfers.)     Equipment Recommendations  Rolling walker with 5" wheels;3in1 (PT)    Recommendations for Other Services       Precautions / Restrictions Precautions Precautions: Fall Restrictions Weight Bearing Restrictions: No Other Position/Activity Restrictions: WBAT    Mobility  Bed Mobility Overal bed mobility: Needs Assistance Bed Mobility: Supine to Sit     Supine to sit: HOB elevated;Min assist;Mod assist     General bed mobility comments: pt exerting increased effort and requires extra time to attempt bed mobility. educated on use of belt to assist Lt LE off EOB. pt unable to complete without assist and Min-Mod assist required due to significant posterior lean with sitting up to EOB.    Transfers Overall transfer level: Needs assistance Equipment used: Rolling walker (2 wheeled) Transfers: Sit to/from Stand Sit to Stand: Min assist;From elevated surface         General transfer comment: Min assist with bed elevated to complete power up and rise to RW. Pt with slight posterior lean and tactile cues to shift  anteriorly required to correct. pt required cues to reach back to control lowering to recliner and has limited hip/knee flexion with tendency to plop into chair. pt also hesitant to sit on toilet and preferred to stand.  Ambulation/Gait Ambulation/Gait assistance: Min assist;Min guard Gait Distance (Feet): 80 Feet Assistive device: Rolling walker (2 wheeled) Gait Pattern/deviations: Step-to pattern;Decreased stance time - left Gait velocity: decr   General Gait Details: cues for step to sequence, pt with improved step length when leading with Rt LE. Cues throughout to improve upright posture as pt tends to lean forward and extend walker too far ahead of himself.   Stairs             Wheelchair Mobility    Modified Rankin (Stroke Patients Only)       Balance Overall balance assessment: History of Falls Sitting-balance support: Feet supported;Bilateral upper extremity supported Sitting balance-Leahy Scale: Fair Sitting balance - Comments: pt reliant on UE support to maintain seated balance at EOB. Postural control: Posterior lean   Standing balance-Leahy Scale: Poor Standing balance comment: reliant on UEs and external support, pt able to reach for toilet paper in bathroom and complete pericare in standing.                            Cognition Arousal/Alertness: Awake/alert Behavior During Therapy: WFL for tasks assessed/performed Overall Cognitive Status: Within Functional Limits for tasks assessed  Exercises Total Joint Exercises Ankle Circles/Pumps: AROM;Both;15 reps Quad Sets: AROM;10 reps;Limitations;Left Short Arc Quad: AROM;Left;5 reps;Limitations Short Arc Quad Limitations: pt with limited quad activation and required [employer]: AROM;Left;5 reps;Limitations Long [employer] Limitations: pt with limited quad activation and required AAROM    General Comments        Pertinent  Vitals/Pain Faces Pain Scale: Hurts even more Pain Location: L hip Pain Descriptors / Indicators: Grimacing;Discomfort;Sore    Home Living                      Prior Function            PT Goals (current goals can now be found in the care plan section) Acute Rehab PT Goals Patient Stated Goal: home soon PT Goal Formulation: With patient Time For Goal Achievement: 12/25/20 Potential to Achieve Goals: Good Progress towards PT goals: Progressing toward goals    Frequency    7X/week      PT Plan Current plan remains appropriate    Co-evaluation              AM-PAC PT "6 Clicks" Mobility   Outcome Measure  Help needed turning from your back to your side while in a flat bed without using bedrails?: A Lot Help needed moving from lying on your back to sitting on the side of a flat bed without using bedrails?: A Lot Help needed moving to and from a bed to a chair (including a wheelchair)?: A Little Help needed standing up from a chair using your arms (e.g., wheelchair or bedside chair)?: A Little Help needed to walk in hospital room?: A Little Help needed climbing 3-5 steps with a railing? : A Lot 6 Click Score: 15    End of Session Equipment Utilized During Treatment: Gait belt Activity Tolerance: Patient tolerated treatment well Patient left: with call bell/phone within reach;in bed;with bed alarm set Nurse Communication: Mobility status PT Visit Diagnosis: Other abnormalities of gait and mobility (R26.89);Difficulty in walking, not elsewhere classified (R26.2);Muscle weakness (generalized) (M62.81)     Time: [id number] PT Time Calculation (min) (ACUTE ONLY): 38 min  Charges:  $Gait Training: 8-22 mins $Therapeutic Exercise: 8-22 mins $Therapeutic Activity: 8-22 mins                     Verner Mould, DPT Acute Rehabilitation Services Office [phone] Pager [phone]    Jacques Navy 12/13/2020, 12:12 PM

## 2020-12-13 NOTE — Progress Notes (Signed)
   12/13/20 1130  Assess: MEWS Score  Temp (!) 97.5 F (36.4 C)  BP (!) 69/45 (PA notified, O2 given at 2ltr/min, I/V bolus 568m of 0.9% Sodium Chloride given)  Pulse Rate 82  Resp 14  Level of Consciousness Alert  SpO2 92 %  O2 Device Room Air  Assess: MEWS Score  MEWS Temp 0  MEWS Systolic 3  MEWS Pulse 0  MEWS RR 0  MEWS LOC 0  MEWS Score 3  MEWS Score Color Yellow  Assess: if the MEWS score is Yellow or Red  Were vital signs taken at a resting state? Yes  Focused Assessment No change from prior assessment  Does the patient meet 2 or more of the SIRS criteria? No  MEWS guidelines implemented *See Row Information* Yes  Treat  MEWS Interventions Escalated (See documentation below)  Pain Scale 0-10  Pain Score 0  Take Vital Signs  Increase Vital Sign Frequency  Yellow: Q 2hr X 2 then Q 4hr X 2, if remains yellow, continue Q 4hrs  Escalate  MEWS: Escalate Yellow: discuss with charge nurse/RN and consider discussing with provider and RRT  Notify: Charge Nurse/RN  Name of Charge Nurse/RN Notified Amy  Date Charge Nurse/RN Notified 12/13/20  Time Charge Nurse/RN Notified 1154  Notify: Provider  Provider Name/Title PA, CRoda Shutters Date Provider Notified 12/13/20  Time Provider Notified 1155  Notification Type Call  Notification Reason Other (Comment) (yellow mews, low BP)  Provider response See new orders  Date of Provider Response 12/13/20  Time of Provider Response 1155  Document  Progress note created (see row info) Yes  Assess: SIRS CRITERIA  SIRS Temperature  0  SIRS Pulse 0  SIRS Respirations  0  SIRS WBC 0  SIRS Score Sum  0

## 2020-12-13 NOTE — Progress Notes (Signed)
   Subjective: 3 Days Post-Op Procedure(s) (LRB): TOTAL HIP ARTHROPLASTY ANTERIOR APPROACH (Left) Patient reports pain as mild.   Patient seen in rounds by Dr. Wynelle Link. Patient is well, and has had no acute complaints or problems. Denies SOB, chest pain, or calf pain. No acute overnight events. Ambulated 50 feet with therapy yesterday. Will continue therapy today.   Plan is to go Home after hospital stay.  Objective: Vital signs in last 24 hours: Temp:  [98.9 F (37.2 C)-99.4 F (37.4 C)] 98.9 F (37.2 C) (08/04 0510) Pulse Rate:  [89-103] 99 (08/04 0510) Resp:  [16-18] 16 (08/04 0510) BP: (131-168)/(81-95) 131/81 (08/04 0510) SpO2:  [97 %-99 %] 99 % (08/04 0510)  Intake/Output from previous day:  Intake/Output Summary (Last 24 hours) at 12/13/2020 0722 Last data filed at 12/13/2020 0600 Gross per 24 hour  Intake 480 ml  Output 950 ml  Net -470 ml    Intake/Output this shift: No intake/output data recorded.  Labs: Recent Labs    12/11/20 0311 12/12/20 0310 12/13/20 0327  HGB 11.7* 11.7* 11.3*   Recent Labs    12/12/20 0310 12/13/20 0327  WBC 9.7 10.1  RBC 4.61 4.43  HCT 38.1* 36.3*  PLT 190 202   Recent Labs    12/11/20 0311 12/12/20 0310  NA 135 137  K 4.3 3.8  CL 101 104  CO2 26 25  BUN 11 8  CREATININE 0.71 0.60*  GLUCOSE 149* 121*  CALCIUM 8.7* 8.7*   No results for input(s): LABPT, INR in the last 72 hours.  Exam: General - Patient is Alert and Oriented Extremity - Neurologically intact Neurovascular intact Intact pulses distally Dorsiflexion/Plantar flexion intact Dressing/Incision - clean, dry, no drainage Motor Function - intact, moving foot and toes well on exam.   Past Medical History:  Diagnosis Date   Anemia    Arthritis    DM (diabetes mellitus) ([place])    Hyperlipidemia    Hypertension    Obesity    Sleep apnea    on CPAP    Assessment/Plan: 3 Days Post-Op Procedure(s) (LRB): TOTAL HIP ARTHROPLASTY ANTERIOR APPROACH  (Left) Active Problems:   Primary osteoarthritis of left hip  Estimated body mass index is 39.78 kg/m as calculated from the following:   Height as of this encounter: '5\' 7"'$  (1.702 m).   Weight as of this encounter: 115.2 kg. Up with therapy Discharge home with home health  DVT Prophylaxis - Aspirin and TED hose Weight-bearing as tolerated.  Plan for 1-2 sessions with PT today, and if meeting goals, will plan for discharge this afternoon.  Submitted order for Home Health PT.  Patient to follow up in two weeks with Dr. Wynelle Link in clinic.   The PDMP database was reviewed today prior to any opioid medications being prescribed to this patient.Fenton Foy, MBA, PA-C Orthopedic Surgery 12/13/2020, 7:22 AM

## 2020-12-13 NOTE — TOC Progression Note (Signed)
Transition of Care [facility]) - Progression Note   Patient Details  Name: Donald [name] MRN: [id number] Date of Birth: 1961/05/02  Transition of Care [facility]) CM/SW [place], LCSW Phone Number: 12/13/2020, 1:46 PM  Clinical Narrative: PT is now recommending SNF as patient has been slow to progress. Patient is agreeable to referral for rehab. Patient has been vaccinated, but not boosted for COVID.  FL2 done; PASRR received. Initial referral faxed out. Facility to start insurance authorization. TOC awaiting bed offers.  Expected Discharge Plan: Skilled Nursing Facility Barriers to Discharge: SNF Pending bed offer, Insurance Authorization  Expected Discharge Plan and Services Expected Discharge Plan: [place] In-house Referral: Clinical Social Work Post Acute Care Choice: [place] Living arrangements for the past 2 months: [place] Expected Discharge Date: 12/13/20               DME Arranged: Berta Minor rolling DME Agency: [employer] Date DME Agency Contacted: 12/11/20 Time DME Agency Contacted: [phone] Representative spoke with at DME Agency: Freda Munro  Readmission Risk Interventions No flowsheet data found.

## 2020-12-13 NOTE — Progress Notes (Signed)
Consulted IV nurse to assess if IV location is contributing to hand swelling.

## 2020-12-13 NOTE — Plan of Care (Signed)
  Problem: Education: Goal: Understanding of discharge needs will improve Outcome: Progressing   Problem: Pain Management: Goal: Pain level will decrease with appropriate interventions Outcome: Progressing   Problem: Activity: Goal: Ability to tolerate increased activity will improve Outcome: Progressing

## 2020-12-13 NOTE — NC FL2 (Signed)
[place] MEDICAID FL2 LEVEL OF CARE SCREENING TOOL     IDENTIFICATION  Patient Name: Donald [name] Birthdate: 06-05-1960 Sex: male Admission Date (Current Location): 12/10/2020  [facility] and [place] Number:  Herbalist and Address:  [facility],  [place] [place], [place]      Provider Number: [id number]  Attending Physician Name and Address:  Gaynelle Arabian, MD  Relative Name and Phone Number:  Mikelle Minear (spouse) Ph: [phone]    Current Level of Care: Hospital Recommended Level of Care: [place] Prior Approval Number:    Date Approved/Denied:   PASRR Number: [id number]  Discharge Plan: SNF    Current Diagnoses: Patient Active Problem List   Diagnosis Date Noted   Primary osteoarthritis of left hip 12/10/2020   Hyperlipidemia    Osteoarthritis of left hip 07/05/2019   Routine health maintenance 07/05/2019   Sleep apnea 07/05/2019   Severe obesity (BMI >= 40) ([place]) 04/09/2013   Type 2 diabetes mellitus ([place]) 04/01/2013   Iron deficiency anemia, unspecified 11/21/2010    Orientation RESPIRATION BLADDER Height & Weight     Self, Time, Situation, Place  O2 (2L/min PRN) Continent Weight: 254 lb (115.2 kg) Height:  '5\' 7"'$  (170.2 cm)  BEHAVIORAL SYMPTOMS/MOOD NEUROLOGICAL BOWEL NUTRITION STATUS      Continent Diet (Carb modified)  AMBULATORY STATUS COMMUNICATION OF NEEDS Skin   Limited Assist Verbally Surgical wounds                       Personal Care Assistance Level of Assistance  Bathing, Feeding, Dressing Bathing Assistance: Limited assistance Feeding assistance: Independent Dressing Assistance: Limited assistance     Functional Limitations Info  Sight, Hearing, Speech Sight Info: Adequate Hearing Info: Adequate Speech Info: Adequate    SPECIAL CARE FACTORS FREQUENCY  PT (By licensed PT), OT (By licensed OT)     PT Frequency: 5x's/week OT Frequency: 5x's/week            Contractures  Contractures Info: Not present    Additional Factors Info  Code Status, Allergies, Insulin Sliding Scale Code Status Info: Full Allergies Info: NKA   Insulin Sliding Scale Info: See discharge summary       Current Medications (12/13/2020):  This is the current hospital active medication list Current Facility-Administered Medications  Medication Dose Route Frequency Provider Last Rate Last Admin   0.9 %  sodium chloride infusion   Intravenous Continuous Derl Barrow, PA 75 mL/hr at 12/10/20 1844 New Bag at 12/10/20 1844   acetaminophen (TYLENOL) tablet 325-650 mg  325-650 mg Oral Q6H PRN Edmisten, Ok Anis, PA       aspirin EC tablet 325 mg  325 mg Oral BID Edmisten, Kristie L, PA   325 mg at 12/13/20 0908   bisacodyl (DULCOLAX) suppository 10 mg  10 mg Rectal Daily PRN Edmisten, Kristie L, PA       docusate sodium (COLACE) capsule 100 mg  100 mg Oral BID Edmisten, Kristie L, PA   100 mg at 12/13/20 0908   HYDROcodone-acetaminophen (NORCO) 7.5-325 MG per tablet 1-2 tablet  1-2 tablet Oral Q4H PRN Edmisten, Kristie L, PA       HYDROcodone-acetaminophen (NORCO/VICODIN) 5-325 MG per tablet 1-2 tablet  1-2 tablet Oral Q4H PRN Edmisten, Kristie L, PA   1 tablet at 12/13/20 0254   insulin aspart (novoLOG) injection 0-15 Units  0-15 Units Subcutaneous TID WC Edmisten, Kristie L, PA   2 Units at  12/12/20 1240   insulin aspart (novoLOG) injection 0-5 Units  0-5 Units Subcutaneous QHS Edmisten, Kristie L, PA       menthol-cetylpyridinium (CEPACOL) lozenge 3 mg  1 lozenge Oral PRN Edmisten, Kristie L, PA       Or   phenol (CHLORASEPTIC) mouth spray 1 spray  1 spray Mouth/Throat PRN Edmisten, Kristie L, PA       methocarbamol (ROBAXIN) tablet 500 mg  500 mg Oral Q6H PRN Edmisten, Kristie L, PA   500 mg at 12/13/20 0253   Or   methocarbamol (ROBAXIN) 500 mg in dextrose 5 % 50 mL IVPB  500 mg Intravenous Q6H PRN Edmisten, Kristie L, PA       metoCLOPramide (REGLAN) tablet 5-10 mg  5-10 mg Oral  Q8H PRN Edmisten, Kristie L, PA       Or   metoCLOPramide (REGLAN) injection 5-10 mg  5-10 mg Intravenous Q8H PRN Edmisten, Kristie L, PA       morphine 4 MG/ML injection 0.52-1 mg  0.52-1 mg Intravenous Q2H PRN Edmisten, Kristie L, PA       ondansetron (ZOFRAN) tablet 4 mg  4 mg Oral Q6H PRN Edmisten, Kristie L, PA       Or   ondansetron (ZOFRAN) injection 4 mg  4 mg Intravenous Q6H PRN Edmisten, Kristie L, PA       polyethylene glycol (MIRALAX / GLYCOLAX) packet 17 g  17 g Oral Daily PRN Edmisten, Kristie L, PA       rosuvastatin (CRESTOR) tablet 5 mg  5 mg Oral Daily Edmisten, Kristie L, PA   5 mg at 12/13/20 0908   traMADol (ULTRAM) tablet 50-100 mg  50-100 mg Oral Q6H PRN Edmisten, Kristie L, PA   50 mg at 12/10/20 1851     Discharge Medications: Please see discharge summary for a list of discharge medications.  Relevant Imaging Results:  Relevant Lab Results:   Additional Information SSN: [id number]  Sherie Don, LCSW

## 2020-12-14 DIAGNOSIS — I10 Essential (primary) hypertension: Secondary | ICD-10-CM | POA: Diagnosis not present

## 2020-12-14 DIAGNOSIS — Z20822 Contact with and (suspected) exposure to covid-19: Secondary | ICD-10-CM | POA: Diagnosis not present

## 2020-12-14 DIAGNOSIS — E119 Type 2 diabetes mellitus without complications: Secondary | ICD-10-CM | POA: Diagnosis not present

## 2020-12-14 DIAGNOSIS — Z7982 Long term (current) use of aspirin: Secondary | ICD-10-CM | POA: Diagnosis not present

## 2020-12-14 DIAGNOSIS — M1612 Unilateral primary osteoarthritis, left hip: Secondary | ICD-10-CM | POA: Diagnosis not present

## 2020-12-14 DIAGNOSIS — Z7984 Long term (current) use of oral hypoglycemic drugs: Secondary | ICD-10-CM | POA: Diagnosis not present

## 2020-12-14 DIAGNOSIS — Z79899 Other long term (current) drug therapy: Secondary | ICD-10-CM | POA: Diagnosis not present

## 2020-12-14 LAB — GLUCOSE, CAPILLARY
Glucose-Capillary: 99 mg/dL (ref 70–99)
Glucose-Capillary: 108 mg/dL — ABNORMAL HIGH (ref 70–99)
Glucose-Capillary: 161 mg/dL — ABNORMAL HIGH (ref 70–99)
Glucose-Capillary: 111 mg/dL — ABNORMAL HIGH (ref 70–99)
Glucose-Capillary: 107 mg/dL — ABNORMAL HIGH (ref 70–99)

## 2020-12-14 MED ORDER — ASPIRIN 325 MG PO TBEC: 325.0000 mg | DELAYED_RELEASE_TABLET | Freq: Two times a day (BID) | ORAL | 0 refills | Status: AC

## 2020-12-14 MED ORDER — METHOCARBAMOL 500 MG PO TABS: 500.0000 mg | ORAL_TABLET | Freq: Four times a day (QID) | ORAL | 0 refills | Status: DC | PRN

## 2020-12-14 MED ORDER — HYDROCODONE-ACETAMINOPHEN 5-325 MG PO TABS: 1.0000 | ORAL_TABLET | Freq: Four times a day (QID) | ORAL | 0 refills | Status: DC | PRN

## 2020-12-14 MED ORDER — TRAMADOL HCL 50 MG PO TABS: 50.0000 mg | ORAL_TABLET | Freq: Four times a day (QID) | ORAL | 0 refills | Status: DC | PRN

## 2020-12-14 NOTE — TOC Progression Note (Signed)
Transition of Care [facility]) - Progression Note   Patient Details  Name: DIOR STEPTER MRN: [id number] Date of Birth: [birth year]  Transition of Care [facility]) CM/SW [place], LCSW Phone Number: 12/14/2020, 1:16 PM  Clinical Narrative: CSW met with patient to review bed offers. Patient selected [facility]. CSW called Tressa Busman with [facility] to confirm bed acceptance. Facility to start insurance authorization. TOC awaiting insurance authorization; patient will need a negative COVID test prior to discharge.  Expected Discharge Plan: Skilled Nursing Facility Barriers to Discharge: SNF Pending bed offer, Insurance Authorization  Expected Discharge Plan and Services Expected Discharge Plan: [place] In-house Referral: Clinical Social Work Post Acute Care Choice: [place] Living arrangements for the past 2 months: [place] Expected Discharge Date: 12/14/20               DME Arranged: Berta Minor rolling DME Agency: [employer] Date DME Agency Contacted: 12/11/20 Time DME Agency Contacted: [phone] Representative spoke with at DME Agency: Freda Munro  Readmission Risk Interventions No flowsheet data found.

## 2020-12-14 NOTE — Plan of Care (Signed)
  Problem: Activity: Goal: Ability to avoid complications of mobility impairment will improve Outcome: Progressing   Problem: Clinical Measurements: Goal: Postoperative complications will be avoided or minimized Outcome: Progressing   Problem: Pain Management: Goal: Pain level will decrease with appropriate interventions Outcome: Progressing   Problem: Skin Integrity: Goal: Will show signs of wound healing Outcome: Progressing   

## 2020-12-14 NOTE — Discharge Summary (Addendum)
Physician Discharge Summary   Patient ID: Donald [name] MRN: [id number] DOB/AGE: 1960-09-16 60 y.o.  Admit date: 12/10/2020 Discharge date: 12/17/2020  Primary Diagnosis: Osteoarthritis, left hip  Admission Diagnoses:  Past Medical History:  Diagnosis Date   Anemia    Arthritis    DM (diabetes mellitus) ([place])    Hyperlipidemia    Hypertension    Obesity    Sleep apnea    on CPAP   Discharge Diagnoses:   Active Problems:   Primary osteoarthritis of left hip  Estimated body mass index is 39.78 kg/m as calculated from the following:   Height as of this encounter: 5' 7" (1.702 m).   Weight as of this encounter: 115.2 kg.  Procedure:  Procedure(s) (LRB): TOTAL HIP ARTHROPLASTY ANTERIOR APPROACH (Left)   Consults: None  HPI: Donald [name] is a 60 y.o. male who has advanced end-stage arthritis of their Left  hip with progressively worsening pain and dysfunction.The patient has failed nonoperative management and presents for total hip arthroplasty.  Laboratory Data: Admission on 12/10/2020  Component Date Value Ref Range Status   ABO/RH(D) 12/10/2020    Final                   Value:O POS Performed at [facility], [place] [address] [place], [place] [address]    Glucose-Capillary 12/10/2020 106 (A) 70 - 99 mg/dL Final   Glucose reference range applies only to samples taken after fasting for at least 8 hours.   Glucose-Capillary 12/10/2020 105 (A) 70 - 99 mg/dL Final   Glucose reference range applies only to samples taken after fasting for at least 8 hours.   Comment 1 12/10/2020 Notify RN   Final   WBC 12/11/2020 10.7 (A) 4.0 - 10.5 K/uL Final   RBC 12/11/2020 4.59  4.22 - 5.81 MIL/uL Final   Hemoglobin 12/11/2020 11.7 (A) 13.0 - 17.0 g/dL Final   HCT 12/11/2020 37.7 (A) 39.0 - 52.0 % Final   MCV 12/11/2020 82.1  80.0 - 100.0 fL Final   MCH 12/11/2020 25.5 (A) 26.0 - 34.0 pg Final   MCHC 12/11/2020 31.0  30.0 - 36.0 g/dL Final   RDW 12/11/2020 13.1  11.5 -  15.5 % Final   Platelets 12/11/2020 208  150 - 400 K/uL Final   nRBC 12/11/2020 0.0  0.0 - 0.2 % Final   Performed at [facility], [place] [address] [place], [place] [address]   Sodium 12/11/2020 135  135 - 145 mmol/L Final   Potassium 12/11/2020 4.3  3.5 - 5.1 mmol/L Final   Chloride 12/11/2020 101  98 - 111 mmol/L Final   CO2 12/11/2020 26  22 - 32 mmol/L Final   Glucose, Bld 12/11/2020 149 (A) 70 - 99 mg/dL Final   Glucose reference range applies only to samples taken after fasting for at least 8 hours.   BUN 12/11/2020 11  8 - 23 mg/dL Final   Creatinine, Ser 12/11/2020 0.71  0.61 - 1.24 mg/dL Final   Calcium 12/11/2020 8.7 (A) 8.9 - 10.3 mg/dL Final   GFR, Estimated 12/11/2020 >60  >60 mL/min Final   Comment: (NOTE) Calculated using the CKD-EPI Creatinine Equation (2021)    Anion gap 12/11/2020 8  5 - 15 Final   Performed at [facility], [place] [address], [place], [place] [address]   Glucose-Capillary 12/10/2020 136 (A) 70 - 99 mg/dL Final   Glucose reference range applies only to samples taken after fasting  for at least 8 hours.   Glucose-Capillary 12/10/2020 177 (A) 70 - 99 mg/dL Final   Glucose reference range applies only to samples taken after fasting for at least 8 hours.   Glucose-Capillary 12/11/2020 120 (A) 70 - 99 mg/dL Final   Glucose reference range applies only to samples taken after fasting for at least 8 hours.   Glucose-Capillary 12/11/2020 128 (A) 70 - 99 mg/dL Final   Glucose reference range applies only to samples taken after fasting for at least 8 hours.   Glucose-Capillary 12/11/2020 110 (A) 70 - 99 mg/dL Final   Glucose reference range applies only to samples taken after fasting for at least 8 hours.   WBC 12/12/2020 9.7  4.0 - 10.5 K/uL Final   RBC 12/12/2020 4.61  4.22 - 5.81 MIL/uL Final   Hemoglobin 12/12/2020 11.7 (A) 13.0 - 17.0 g/dL Final   HCT 12/12/2020 38.1 (A) 39.0 - 52.0 % Final   MCV 12/12/2020 82.6  80.0 -  100.0 fL Final   MCH 12/12/2020 25.4 (A) 26.0 - 34.0 pg Final   MCHC 12/12/2020 30.7  30.0 - 36.0 g/dL Final   RDW 12/12/2020 13.3  11.5 - 15.5 % Final   Platelets 12/12/2020 190  150 - 400 K/uL Final   nRBC 12/12/2020 0.0  0.0 - 0.2 % Final   Performed at [facility], [place] [address], [place], [place] [address]   Sodium 12/12/2020 137  135 - 145 mmol/L Final   Potassium 12/12/2020 3.8  3.5 - 5.1 mmol/L Final   Chloride 12/12/2020 104  98 - 111 mmol/L Final   CO2 12/12/2020 25  22 - 32 mmol/L Final   Glucose, Bld 12/12/2020 121 (A) 70 - 99 mg/dL Final   Glucose reference range applies only to samples taken after fasting for at least 8 hours.   BUN 12/12/2020 8  8 - 23 mg/dL Final   Creatinine, Ser 12/12/2020 0.60 (A) 0.61 - 1.24 mg/dL Final   Calcium 12/12/2020 8.7 (A) 8.9 - 10.3 mg/dL Final   GFR, Estimated 12/12/2020 >60  >60 mL/min Final   Comment: (NOTE) Calculated using the CKD-EPI Creatinine Equation (2021)    Anion gap 12/12/2020 8  5 - 15 Final   Performed at [facility], [place] [address] [place], [place] [address]   Glucose-Capillary 12/11/2020 126 (A) 70 - 99 mg/dL Final   Glucose reference range applies only to samples taken after fasting for at least 8 hours.   Glucose-Capillary 12/12/2020 113 (A) 70 - 99 mg/dL Final   Glucose reference range applies only to samples taken after fasting for at least 8 hours.   Glucose-Capillary 12/12/2020 123 (A) 70 - 99 mg/dL Final   Glucose reference range applies only to samples taken after fasting for at least 8 hours.   Glucose-Capillary 12/12/2020 119 (A) 70 - 99 mg/dL Final   Glucose reference range applies only to samples taken after fasting for at least 8 hours.   WBC 12/13/2020 10.1  4.0 - 10.5 K/uL Final   RBC 12/13/2020 4.43  4.22 - 5.81 MIL/uL Final   Hemoglobin 12/13/2020 11.3 (A) 13.0 - 17.0 g/dL Final   HCT 12/13/2020 36.3 (A) 39.0 - 52.0 % Final   MCV 12/13/2020 81.9  80.0 - 100.0 fL  Final   MCH 12/13/2020 25.5 (A) 26.0 - 34.0 pg Final   MCHC 12/13/2020 31.1  30.0 - 36.0 g/dL Final   RDW 12/13/2020 13.3  11.5 - 15.5 % Final  Platelets 12/13/2020 202  150 - 400 K/uL Final   nRBC 12/13/2020 0.0  0.0 - 0.2 % Final   Performed at [facility], [place] [address] [place], [place] [address]   Glucose-Capillary 12/12/2020 126 (A) 70 - 99 mg/dL Final   Glucose reference range applies only to samples taken after fasting for at least 8 hours.   Glucose-Capillary 12/13/2020 99  70 - 99 mg/dL Final   Glucose reference range applies only to samples taken after fasting for at least 8 hours.   Glucose-Capillary 12/13/2020 113 (A) 70 - 99 mg/dL Final   Glucose reference range applies only to samples taken after fasting for at least 8 hours.   Glucose-Capillary 12/13/2020 114 (A) 70 - 99 mg/dL Final   Glucose reference range applies only to samples taken after fasting for at least 8 hours.   Glucose-Capillary 12/13/2020 108 (A) 70 - 99 mg/dL Final   Glucose reference range applies only to samples taken after fasting for at least 8 hours.  Hospital Outpatient Visit on 12/06/2020  Component Date Value Ref Range Status   SARS Coronavirus 2 12/06/2020 NEGATIVE  NEGATIVE Final   Comment: (NOTE) SARS-CoV-2 target nucleic acids are NOT DETECTED.  The SARS-CoV-2 RNA is generally detectable in upper and lower respiratory specimens during the acute phase of infection. Negative results do not preclude SARS-CoV-2 infection, do not rule out co-infections with other pathogens, and should not be used as the sole basis for treatment or other patient management decisions. Negative results must be combined with clinical observations, patient history, and epidemiological information. The expected result is Negative.  Fact Sheet for Patients: SugarRoll.be  Fact Sheet for Healthcare Providers: https://www.woods-mathews.com/  This test is  not yet approved or cleared by the [place] FDA and  has been authorized for detection and/or diagnosis of SARS-CoV-2 by FDA under an Emergency Use Authorization (EUA). This EUA will remain  in effect (meaning this test can be used) for the duration of the COVID-19 declaration under Se                          ction 564(b)(1) of the Act, 21 U.S.C. section 360bbb-3(b)(1), unless the authorization is terminated or revoked sooner.  Performed at [facility], [place] [address] [place],  [address]   Hospital Outpatient Visit on 11/30/2020  Component Date Value Ref Range Status   MRSA, PCR 11/30/2020 NEGATIVE  NEGATIVE Final   Staphylococcus aureus 11/30/2020 NEGATIVE  NEGATIVE Final   Comment: (NOTE) The Xpert SA Assay (FDA approved for NASAL specimens in patients 60 years of age and older), is one component of a comprehensive surveillance program. It is not intended to diagnose infection nor to guide or monitor treatment. Performed at [facility], [place] [address], [place], [place] [address]    WBC 11/30/2020 4.6  4.0 - 10.5 K/uL Final   RBC 11/30/2020 5.16  4.22 - 5.81 MIL/uL Final   Hemoglobin 11/30/2020 13.0  13.0 - 17.0 g/dL Final   HCT 11/30/2020 42.4  39.0 - 52.0 % Final   MCV 11/30/2020 82.2  80.0 - 100.0 fL Final   MCH 11/30/2020 25.2 (A) 26.0 - 34.0 pg Final   MCHC 11/30/2020 30.7  30.0 - 36.0 g/dL Final   RDW 11/30/2020 13.2  11.5 - 15.5 % Final   Platelets 11/30/2020 236  150 - 400 K/uL Final   nRBC 11/30/2020 0.0  0.0 - 0.2 % Final  Performed at [facility], [place] [address] [place], [place] [address]   Sodium 11/30/2020 141  135 - 145 mmol/L Final   Potassium 11/30/2020 4.2  3.5 - 5.1 mmol/L Final   Chloride 11/30/2020 104  98 - 111 mmol/L Final   CO2 11/30/2020 28  22 - 32 mmol/L Final   Glucose, Bld 11/30/2020 107 (A) 70 - 99 mg/dL Final   Glucose reference range applies only to samples taken after fasting for at  least 8 hours.   BUN 11/30/2020 9  8 - 23 mg/dL Final   Creatinine, Ser 11/30/2020 0.53 (A) 0.61 - 1.24 mg/dL Final   Calcium 11/30/2020 9.4  8.9 - 10.3 mg/dL Final   Total Protein 11/30/2020 8.4 (A) 6.5 - 8.1 g/dL Final   Albumin 11/30/2020 4.2  3.5 - 5.0 g/dL Final   AST 11/30/2020 18  15 - 41 U/L Final   ALT 11/30/2020 16  0 - 44 U/L Final   Alkaline Phosphatase 11/30/2020 62  38 - 126 U/L Final   Total Bilirubin 11/30/2020 1.0  0.3 - 1.2 mg/dL Final   GFR, Estimated 11/30/2020 >60  >60 mL/min Final   Comment: (NOTE) Calculated using the CKD-EPI Creatinine Equation (2021)    Anion gap 11/30/2020 9  5 - 15 Final   Performed at [facility], [place] [address] [place], [place] [address]   ABO/RH(D) 11/30/2020 O POS   Final   Antibody Screen 11/30/2020 NEG   Final   Sample Expiration 11/30/2020 12/13/2020,2359   Final   Extend sample reason 11/30/2020    Final                   Value:NO TRANSFUSIONS OR PREGNANCY IN THE PAST 3 MONTHS Performed at [place] [address] [place], [place] [address]    Prothrombin Time 11/30/2020 13.7  11.4 - 15.2 seconds Final   INR 11/30/2020 1.1  0.8 - 1.2 Final   Comment: (NOTE) INR goal varies based on device and disease states. Performed at [facility], [place] [address], [place], [place] [address]    Hgb A1c MFr Bld 11/30/2020 6.0 (A) 4.8 - 5.6 % Final   Comment: (NOTE)         Prediabetes: 5.7 - 6.4         Diabetes: >6.4         Glycemic control for adults with diabetes: <7.0    Mean Plasma Glucose 11/30/2020 126  mg/dL Final   Comment: (NOTE) Performed At: [facility] [place], [place] [id number] Rush Farmer MD [id number]    Glucose-Capillary 11/30/2020 100 (A) 70 - 99 mg/dL Final   Glucose reference range applies only to samples taken after fasting for at least 8 hours.     X-Rays:DG Pelvis Portable  Result Date: 12/10/2020 CLINICAL DATA:   Status post left hip replacement. EXAM: PORTABLE PELVIS 1-2 VIEWS COMPARISON:  Preoperative radiograph 10/11/2018 FINDINGS: Left hip arthroplasty in expected alignment. No periprosthetic lucency or fracture. Recent postsurgical change includes air and edema in the soft tissues. The patient is rotated. IMPRESSION: Left hip arthroplasty without immediate postoperative complication. Electronically Signed   By: Keith Rake M.D.   On: 12/10/2020 16:45   DG C-Arm 1-60 Min-No Report  Result Date: 12/10/2020 Fluoroscopy was utilized by the requesting physician.  No radiographic interpretation.   DG HIP OPERATIVE UNILAT W OR W/O PELVIS LEFT  Result Date: 12/10/2020 CLINICAL DATA:  Elective surgery.  EXAM: OPERATIVE LEFT HIP (WITH PELVIS IF PERFORMED) TECHNIQUE: Fluoroscopic spot image(s) were submitted for interpretation post-operatively. COMPARISON:  Preoperative radiograph 10/11/2018 FINDINGS: Ten fluoroscopic spot views of the pelvis and left hip obtained in the operating room. Sequential images during left hip arthroplasty. Fluoroscopy time 8 seconds. Dose not provided. IMPRESSION: Procedural fluoroscopy for left hip arthroplasty. Electronically Signed   By: Keith Rake M.D.   On: 12/10/2020 16:48    EKG: Orders placed or performed during the hospital encounter of 11/30/20   EKG 12-Lead   EKG 12-Lead     Hospital Course: Donald [name] is a 60 y.o. who was admitted to [facility]. They were brought to the operating room on 12/10/2020 and underwent Procedure(s): [place].  Patient tolerated the procedure well and was later transferred to the recovery room and then to the orthopaedic floor for postoperative care. They were given PO and IV analgesics for pain control following their surgery. They were given 24 hours of postoperative antibiotics of  Anti-infectives (From admission, onward)    Start     Dose/Rate Route Frequency Ordered Stop   12/10/20 2000   ceFAZolin (ANCEF) IVPB 2g/100 mL premix        2 g 200 mL/hr over 30 Minutes Intravenous Every 6 hours 12/10/20 1811 12/11/20 0210   12/10/20 1145  ceFAZolin (ANCEF) 2 g in sodium chloride 0.9 % 100 mL IVPB        2 g 200 mL/hr over 30 Minutes Intravenous On call to O.R. 12/10/20 1139 12/10/20 1405      and started on DVT prophylaxis in the form of Aspirin.   PT and OT were ordered for total joint protocol. Discharge planning consulted to help with postop disposition and equipment needs. Patient had a decent night on the evening of surgery. They started to get up OOB with therapy on POD #1. Hospital stay was prolonged due to slow progression with physical therapy. On POD #3, physical therapy recommended SNF due to lack of significant progression. Social work was consulted for bed arrangement. On POD #4, he was feeling better and was ready for discharge. A bed was arranged and he was discharged later that day in stable condition.  Diet: Regular diet Activity: WBAT Follow-up: in 2 weeks Disposition: Skilled nursing facility Discharged Condition: stable   Discharge Instructions     Call MD / Call 911   Complete by: As directed    If you experience chest pain or shortness of breath, CALL 911 and be transported to the hospital emergency room.  If you develope a fever above 101 F, pus (white drainage) or increased drainage or redness at the wound, or calf pain, call your surgeon's office.   Call MD / Call 911   Complete by: As directed    If you experience chest pain or shortness of breath, CALL 911 and be transported to the hospital emergency room.  If you develope a fever above 101 F, pus (white drainage) or increased drainage or redness at the wound, or calf pain, call your surgeon's office.   Change dressing   Complete by: As directed    You have an adhesive waterproof bandage over the incision. Leave this in place until your first follow-up appointment. Once you remove this you will not  need to place another bandage.   Change dressing   Complete by: As directed    You have an adhesive waterproof bandage over the incision. Leave this in place  until your first follow-up appointment. Once you remove this you will not need to place another bandage.   Constipation Prevention   Complete by: As directed    Drink plenty of fluids.  Prune juice may be helpful.  You may use a stool softener, such as Colace (over the counter) 100 mg twice a day.  Use MiraLax (over the counter) for constipation as needed.   Constipation Prevention   Complete by: As directed    Drink plenty of fluids.  Prune juice may be helpful.  You may use a stool softener, such as Colace (over the counter) 100 mg twice a day.  Use MiraLax (over the counter) for constipation as needed.   Diet - low sodium heart healthy   Complete by: As directed    Diet - low sodium heart healthy   Complete by: As directed    Do not sit on low chairs, stoools or toilet seats, as it may be difficult to get up from low surfaces   Complete by: As directed    Do not sit on low chairs, stoools or toilet seats, as it may be difficult to get up from low surfaces   Complete by: As directed    Driving restrictions   Complete by: As directed    No driving for two weeks   Driving restrictions   Complete by: As directed    No driving for two weeks   Post-operative opioid taper instructions:   Complete by: As directed    POST-OPERATIVE OPIOID TAPER INSTRUCTIONS: It is important to wean off of your opioid medication as soon as possible. If you do not need pain medication after your surgery it is ok to stop day one. Opioids include: Codeine, Hydrocodone(Norco, Vicodin), Oxycodone(Percocet, oxycontin) and hydromorphone amongst others.  Long term and even short term use of opiods can cause: Increased pain response Dependence Constipation Depression Respiratory depression And more.  Withdrawal symptoms can include Flu like  symptoms Nausea, vomiting And more Techniques to manage these symptoms Hydrate well Eat regular healthy meals Stay active Use relaxation techniques(deep breathing, meditating, yoga) Do Not substitute Alcohol to help with tapering If you have been on opioids for less than two weeks and do not have pain than it is ok to stop all together.  Plan to wean off of opioids This plan should start within one week post op of your joint replacement. Maintain the same interval or time between taking each dose and first decrease the dose.  Cut the total daily intake of opioids by one tablet each day Next start to increase the time between doses. The last dose that should be eliminated is the evening dose.      Post-operative opioid taper instructions:   Complete by: As directed    POST-OPERATIVE OPIOID TAPER INSTRUCTIONS: It is important to wean off of your opioid medication as soon as possible. If you do not need pain medication after your surgery it is ok to stop day one. Opioids include: Codeine, Hydrocodone(Norco, Vicodin), Oxycodone(Percocet, oxycontin) and hydromorphone amongst others.  Long term and even short term use of opiods can cause: Increased pain response Dependence Constipation Depression Respiratory depression And more.  Withdrawal symptoms can include Flu like symptoms Nausea, vomiting And more Techniques to manage these symptoms Hydrate well Eat regular healthy meals Stay active Use relaxation techniques(deep breathing, meditating, yoga) Do Not substitute Alcohol to help with tapering If you have been on opioids for less than two weeks and do not have pain  than it is ok to stop all together.  Plan to wean off of opioids This plan should start within one week post op of your joint replacement. Maintain the same interval or time between taking each dose and first decrease the dose.  Cut the total daily intake of opioids by one tablet each day Next start to increase  the time between doses. The last dose that should be eliminated is the evening dose.      TED hose   Complete by: As directed    Use stockings (TED hose) for three weeks on both leg(s).  You may remove them at night for sleeping.   TED hose   Complete by: As directed    Use stockings (TED hose) for three weeks on both leg(s).  You may remove them at night for sleeping.   Weight bearing as tolerated   Complete by: As directed    Weight bearing as tolerated   Complete by: As directed       Allergies as of 12/14/2020   No Known Allergies      Medication List     STOP taking these medications    aspirin 81 MG tablet Replaced by: aspirin 325 MG EC tablet   cholecalciferol 1000 units tablet Commonly known as: VITAMIN D   meloxicam 15 MG tablet Commonly known as: MOBIC       TAKE these medications    acetaminophen 650 MG CR tablet Commonly known as: TYLENOL Take 1,000 mg by mouth every 8 (eight) hours as needed for pain.   aspirin 325 MG EC tablet Take 1 tablet (325 mg total) by mouth 2 (two) times daily for 20 days. Then resume one 81 mg aspirin once a day. Replaces: aspirin 81 MG tablet   blood glucose meter kit and supplies Dispense based on patient and insurance preference. Use up to four times daily as directed. (FOR ICD-9 250.00, 250.01).   blood glucose meter kit and supplies Kit Dispense based on patient and insurance preference. Use up to four times daily as directed. (FOR ICD-9 250.00, 250.01).   glucose blood test strip Commonly known as: ONE TOUCH ULTRA TEST Use as directed   HYDROcodone-acetaminophen 5-325 MG tablet Commonly known as: NORCO/VICODIN Take 1-2 tablets by mouth every 6 (six) hours as needed for moderate pain (pain score 4-6).   methocarbamol 500 MG tablet Commonly known as: ROBAXIN Take 1 tablet (500 mg total) by mouth every 6 (six) hours as needed for muscle spasms.   multivitamin tablet Take 1 tablet by mouth daily.    rosuvastatin 5 MG tablet Commonly known as: Crestor Take 1 tablet (5 mg total) by mouth daily.   traMADol 50 MG tablet Commonly known as: ULTRAM Take 1-2 tablets (50-100 mg total) by mouth every 6 (six) hours as needed for moderate pain.       ASK your doctor about these medications    metFORMIN 500 MG 24 hr tablet Commonly known as: [place] 1 TABLET BY MOUTH TWICE A DAY               Durable Medical Equipment  (From admission, onward)           Start     Ordered   12/11/20 1053  For home use only DME Walker rolling  Once       Question Answer Comment  Walker: With 5 Inch Wheels   Patient needs a walker to treat with the following condition Osteoarthritis of hip  12/11/20 1052   12/11/20 1053  For home use only DME 3 n 1  Once        12/11/20 1052              Discharge Care Instructions  (From admission, onward)           Start     Ordered   12/13/20 0000  Weight bearing as tolerated        12/13/20 0726   12/13/20 0000  Change dressing       Comments: You have an adhesive waterproof bandage over the incision. Leave this in place until your first follow-up appointment. Once you remove this you will not need to place another bandage.   12/13/20 0726   12/11/20 0000  Weight bearing as tolerated        12/11/20 0752   12/11/20 0000  Change dressing       Comments: You have an adhesive waterproof bandage over the incision. Leave this in place until your first follow-up appointment. Once you remove this you will not need to place another bandage.   12/11/20 8270            Follow-up Information     Gaynelle Arabian, MD Follow up.   Specialty: Orthopedic Surgery Why: You are scheduled for an appointment on 12/25/20 at 1:45pm Contact information: [address] [place] [address] [phone]                 Signed: Theresa Duty, PA-C Orthopedic Surgery 12/14/2020, 7:22 AM

## 2020-12-14 NOTE — Progress Notes (Signed)
Physical Therapy Treatment Patient Details Name: Donald [name] MRN: [id number] DOB: 1960/10/10 Today's Date: 12/14/2020    History of Present Illness 60 yo male s/p R L DA THA PMH: DM, sleep apnea, HTn, obesity    PT Comments    Pt progressing slowly toward goals. Continues to require incr assist for bed mobility and transfers. Improving gait/activity tol. Continue PT POC   Follow Up Recommendations  SNF (pt lives alone and is requiring increased assist for bed mobility and transfers.)     Equipment Recommendations  Rolling walker with 5" wheels;3in1 (PT)    Recommendations for Other Services       Precautions / Restrictions Precautions Precautions: Fall Restrictions Other Position/Activity Restrictions: WBAT    Mobility  Bed Mobility Overal bed mobility: Needs Assistance Bed Mobility: Supine to Sit;Sit to Supine     Supine to sit: HOB elevated;Min assist;Mod assist Sit to supine: Mod assist   General bed mobility comments: pt exerting increased effort and requires extra time to attempt bed mobility. educated on use of belt to assist Lt LE off EOB. pt unable to complete without assist and Min-Mod assist required due to significant posterior lean with sitting up to EOB. assist to elevate bil LEs on to bed, heavy use of rail to position self in supine    Transfers Overall transfer level: Needs assistance Equipment used: Rolling walker (2 wheeled) Transfers: Sit to/from Stand Sit to Stand: Min assist;From elevated surface         General transfer comment: Min assist with bed elevated to complete power up and rise to RW. Pt with slight posterior lean and tactile cues to shift anteriorly required to correct. pt required cues to reach back to control lowering to recliner and has limited hip/knee flexion with tendency to plop into chair.  Ambulation/Gait Ambulation/Gait assistance: Min guard  Hallway ambulation Assistive device: Rolling walker (2 wheeled) Gait  Pattern/deviations: Step-to pattern;Decreased stance time - left Gait velocity: decr   General Gait Details: cues for step to sequence, pt with improved step length when leading with Rt LE. Cues throughout to improve upright posture/trunk and hip extension as pt tends to lean forward and extend walker too far ahead of himself.   Stairs             Wheelchair Mobility    Modified Rankin (Stroke Patients Only)       Balance Overall balance assessment: History of Falls Sitting-balance support: Feet supported;Bilateral upper extremity supported Sitting balance-Leahy Scale: Fair Sitting balance - Comments: pt reliant on UE support to maintain seated balance at EOB. Postural control: Posterior lean   Standing balance-Leahy Scale: Poor Standing balance comment: reliant on UEs and external support, pt able to reach for toilet paper in bathroom and complete pericare in standing.                            Cognition Arousal/Alertness: Awake/alert Behavior During Therapy: WFL for tasks assessed/performed Overall Cognitive Status: Within Functional Limits for tasks assessed                                        Exercises Total Joint Exercises Ankle Circles/Pumps: AROM;Both;10 reps    General Comments        Pertinent Vitals/Pain Pain Assessment: 0-10 Faces Pain Scale: Hurts little more Pain Location: L hip Pain Descriptors /  Indicators: Grimacing;Discomfort;Sore Pain Intervention(s): Limited activity within patient's tolerance;Monitored during session;Premedicated before session;Ice applied    Home Living                      Prior Function            PT Goals (current goals can now be found in the care plan section) Acute Rehab PT Goals Patient Stated Goal: home soon PT Goal Formulation: With patient Time For Goal Achievement: 12/25/20 Potential to Achieve Goals: Good Progress towards PT goals: Progressing toward goals     Frequency    7X/week      PT Plan Current plan remains appropriate    Co-evaluation              AM-PAC PT "6 Clicks" Mobility   Outcome Measure  Help needed turning from your back to your side while in a flat bed without using bedrails?: A Lot Help needed moving from lying on your back to sitting on the side of a flat bed without using bedrails?: A Lot Help needed moving to and from a bed to a chair (including a wheelchair)?: A Little Help needed standing up from a chair using your arms (e.g., wheelchair or bedside chair)?: A Little Help needed to walk in hospital room?: A Little Help needed climbing 3-5 steps with a railing? : A Lot 6 Click Score: 15    End of Session Equipment Utilized During Treatment: Gait belt Activity Tolerance: Patient tolerated treatment well Patient left: with call bell/phone within reach;in bed;with bed alarm set Nurse Communication: Mobility status PT Visit Diagnosis: Other abnormalities of gait and mobility (R26.89);Difficulty in walking, not elsewhere classified (R26.2);Muscle weakness (generalized) (M62.81)     Time: [id number] PT Time Calculation (min) (ACUTE ONLY): 32 min  Charges:  $Therapeutic Activity: 8-22 mins                     Baxter Flattery, PT  Acute Rehab Dept ([place]) [phone] Pager [phone]  12/14/2020    [facility] 12/14/2020, 3:11 PM

## 2020-12-14 NOTE — Progress Notes (Signed)
Physical Therapy Treatment Patient Details Name: Donald [name] MRN: [id number] DOB: 13-Apr-1961 Today's Date: 12/14/2020    History of Present Illness 60 yo male s/p R L DA THA PMH: DM, sleep apnea, HTn, obesity    PT Comments    Pt progressing  slowly toward PT goals. Motivated to work with PT however continues to require incr assist with bed mobility and transfers. Agree with plan for SNF as pt does not have assist at home.                                                        Follow Up Recommendations  SNF (pt lives alone and is requiring increased assist for bed mobility and transfers.)     Equipment Recommendations  Rolling walker with 5" wheels;3in1 (PT)    Recommendations for Other Services       Precautions / Restrictions Precautions Precautions: Fall Restrictions Weight Bearing Restrictions: No LLE Weight Bearing: Weight bearing as tolerated Other Position/Activity Restrictions: WBAT    Mobility  Bed Mobility Overal bed mobility: Needs Assistance Bed Mobility: Supine to Sit     Supine to sit: HOB elevated;Min assist;Mod assist     General bed mobility comments: pt exerting increased effort and requires extra time to attempt bed mobility. educated on use of belt to assist Lt LE off EOB. pt unable to complete without assist and Min-Mod assist required due to significant posterior lean with sitting up to EOB.    Transfers Overall transfer level: Needs assistance Equipment used: Rolling walker (2 wheeled) Transfers: Sit to/from Stand Sit to Stand: Min assist;From elevated surface         General transfer comment: Min assist with bed elevated to complete power up and rise to RW. Pt with slight posterior lean and tactile cues to shift anteriorly required to correct. pt required cues to reach back to control lowering to recliner and has limited hip/knee flexion with tendency to plop into chair.  Ambulation/Gait Ambulation/Gait assistance: Min guard Gait Distance  (Feet): 90 Feet Assistive device: Rolling walker (2 wheeled) Gait Pattern/deviations: Step-to pattern;Decreased stance time - left Gait velocity: decr   General Gait Details: cues for step to sequence, pt with improved step length when leading with Rt LE. Cues throughout to improve upright posture/trunk and hip extension as pt tends to lean forward and extend walker too far ahead of himself.   Stairs             Wheelchair Mobility    Modified Rankin (Stroke Patients Only)       Balance Overall balance assessment: History of Falls Sitting-balance support: Feet supported;Bilateral upper extremity supported Sitting balance-Leahy Scale: Fair Sitting balance - Comments: pt reliant on UE support to maintain seated balance at EOB. Postural control: Posterior lean   Standing balance-Leahy Scale: Poor Standing balance comment: reliant on UEs and external support, pt able to reach for toilet paper in bathroom and complete pericare in standing.                            Cognition Arousal/Alertness: Awake/alert Behavior During Therapy: WFL for tasks assessed/performed Overall Cognitive Status: Within Functional Limits for tasks assessed  Exercises      General Comments        Pertinent Vitals/Pain Pain Assessment: 0-10 Faces Pain Scale: Hurts little more Pain Location: L hip Pain Descriptors / Indicators: Grimacing;Discomfort;Sore Pain Intervention(s): Limited activity within patient's tolerance;Monitored during session;Premedicated before session;Repositioned    Home Living                      Prior Function            PT Goals (current goals can now be found in the care plan section) Acute Rehab PT Goals Patient Stated Goal: home soon PT Goal Formulation: With patient Time For Goal Achievement: 12/25/20 Potential to Achieve Goals: Good Progress towards PT goals: Progressing toward  goals    Frequency    7X/week      PT Plan Current plan remains appropriate    Co-evaluation              AM-PAC PT "6 Clicks" Mobility   Outcome Measure  Help needed turning from your back to your side while in a flat bed without using bedrails?: A Lot Help needed moving from lying on your back to sitting on the side of a flat bed without using bedrails?: A Lot Help needed moving to and from a bed to a chair (including a wheelchair)?: A Little Help needed standing up from a chair using your arms (e.g., wheelchair or bedside chair)?: A Little Help needed to walk in hospital room?: A Little Help needed climbing 3-5 steps with a railing? : A Lot 6 Click Score: 15    End of Session Equipment Utilized During Treatment: Gait belt Activity Tolerance: Patient tolerated treatment well Patient left: with call bell/phone within reach;in bed;with bed alarm set Nurse Communication: Mobility status PT Visit Diagnosis: Other abnormalities of gait and mobility (R26.89);Difficulty in walking, not elsewhere classified (R26.2);Muscle weakness (generalized) (M62.81)     Time: [id number] PT Time Calculation (min) (ACUTE ONLY): 26 min  Charges:  $Gait Training: 8-22 mins $Therapeutic Activity: 8-22 mins                     Baxter Flattery, PT  Acute Rehab Dept ([place]) [phone] Pager [phone]  12/14/2020    [facility] 12/14/2020, 11:31 AM

## 2020-12-14 NOTE — Plan of Care (Signed)

## 2020-12-15 DIAGNOSIS — Z79899 Other long term (current) drug therapy: Secondary | ICD-10-CM | POA: Diagnosis not present

## 2020-12-15 DIAGNOSIS — E119 Type 2 diabetes mellitus without complications: Secondary | ICD-10-CM | POA: Diagnosis not present

## 2020-12-15 DIAGNOSIS — Z7984 Long term (current) use of oral hypoglycemic drugs: Secondary | ICD-10-CM | POA: Diagnosis not present

## 2020-12-15 DIAGNOSIS — Z20822 Contact with and (suspected) exposure to covid-19: Secondary | ICD-10-CM | POA: Diagnosis not present

## 2020-12-15 DIAGNOSIS — I10 Essential (primary) hypertension: Secondary | ICD-10-CM | POA: Diagnosis not present

## 2020-12-15 DIAGNOSIS — Z7982 Long term (current) use of aspirin: Secondary | ICD-10-CM | POA: Diagnosis not present

## 2020-12-15 DIAGNOSIS — M1612 Unilateral primary osteoarthritis, left hip: Secondary | ICD-10-CM | POA: Diagnosis not present

## 2020-12-15 LAB — GLUCOSE, CAPILLARY
Glucose-Capillary: 106 mg/dL — ABNORMAL HIGH (ref 70–99)
Glucose-Capillary: 102 mg/dL — ABNORMAL HIGH (ref 70–99)
Glucose-Capillary: 122 mg/dL — ABNORMAL HIGH (ref 70–99)
Glucose-Capillary: 118 mg/dL — ABNORMAL HIGH (ref 70–99)

## 2020-12-15 NOTE — Progress Notes (Addendum)
Physical Therapy Treatment Patient Details Name: Donald [name] MRN: [id number] DOB: 17-Jul-1960 Today's Date: 12/15/2020    History of Present Illness 60 yo male s/p R L DA THA PMH: DM, sleep apnea, HTn, obesity    PT Comments    Pt progressing toward goals. Continues to require incr assist with bed mobility. Will benefit from SNF post acute. Pt also with reports of blood in urine today, RN notified.  Follow Up Recommendations  SNF (pt lives alone and is requiring increased assist for bed mobility and transfers.)     Equipment Recommendations  Rolling walker with 5" wheels;3in1 (PT)    Recommendations for Other Services       Precautions / Restrictions Precautions Precautions: Fall Restrictions Weight Bearing Restrictions: No Other Position/Activity Restrictions: WBAT    Mobility  Bed Mobility Overal bed mobility: Needs Assistance Bed Mobility: Supine to Sit;Sit to Supine     Supine to sit: HOB elevated;Min assist;Mod assist Sit to supine: Mod assist   General bed mobility comments: partial roll to left side. pt exerting increased effort and requires extra time to complete bed mobility. educated on use of belt to assist Lt LE off EOB. pt unable to complete without assist and Min-Mod assist required due to significant posterior lean with sitting up to EOB. assist to elevate bil LEs on to bed, heavy use of rail to position self in supine    Transfers Overall transfer level: Needs assistance Equipment used: Rolling walker (2 wheeled) Transfers: Sit to/from Stand Sit to Stand: From elevated surface;Min guard         General transfer comment: Min/guard assist with bed elevated to complete power up and rise to RW. Pt with slight posterior lean and tactile cues to shift anteriorly required to correct. pt required cues to reach back to control lowering to bed and has limited hip/knee flexion with tendency to plop into chair.  Ambulation/Gait Ambulation/Gait assistance: Min  guard Gait Distance (Feet):  (hallway ambulation) Assistive device: Rolling walker (2 wheeled) Gait Pattern/deviations: Step-to pattern;Step-through pattern;Decreased stance time - left Gait velocity: decr   General Gait Details: cues for step to sequence, pt with improved step length when leading with Rt LE, beginning stp through gait. Cues throughout to improve upright posture/trunk and hip extension and L knee flexion during swing.   Stairs             Wheelchair Mobility    Modified Rankin (Stroke Patients Only)       Balance Overall balance assessment: History of Falls Sitting-balance support: Feet supported;Bilateral upper extremity supported Sitting balance-Leahy Scale: Fair Sitting balance - Comments: pt reliant on UE support to maintain seated balance at EOB. Postural control: Posterior lean   Standing balance-Leahy Scale: Poor Standing balance comment: reliant on UEs and external support, pt able to reach for toilet paper in bathroom and complete pericare in standing.                            Cognition Arousal/Alertness: Awake/alert Behavior During Therapy: WFL for tasks assessed/performed Overall Cognitive Status: Within Functional Limits for tasks assessed                                        Exercises Total Joint Exercises Ankle Circles/Pumps: AROM;Both;10 reps Quad Sets: 5 reps;AROM;Strengthening Heel Slides: 5 reps;Left    General Comments  Pertinent Vitals/Pain Pain Assessment: 0-10 Faces Pain Scale: Hurts a little bit Pain Location: L hip Pain Descriptors / Indicators: Discomfort;Sore Pain Intervention(s): Limited activity within patient's tolerance;Monitored during session;Premedicated before session;Ice applied    Home Living                      Prior Function            PT Goals (current goals can now be found in the care plan section) Acute Rehab PT Goals Patient Stated Goal: home  soon PT Goal Formulation: With patient Time For Goal Achievement: 12/25/20 Potential to Achieve Goals: Good Progress towards PT goals: Progressing toward goals    Frequency    7X/week      PT Plan Current plan remains appropriate    Co-evaluation              AM-PAC PT "6 Clicks" Mobility   Outcome Measure  Help needed turning from your back to your side while in a flat bed without using bedrails?: A Lot Help needed moving from lying on your back to sitting on the side of a flat bed without using bedrails?: A Lot Help needed moving to and from a bed to a chair (including a wheelchair)?: A Little Help needed standing up from a chair using your arms (e.g., wheelchair or bedside chair)?: A Little Help needed to walk in hospital room?: A Little Help needed climbing 3-5 steps with a railing? : A Lot 6 Click Score: 15    End of Session Equipment Utilized During Treatment: Gait belt Activity Tolerance: Patient tolerated treatment well Patient left: with call bell/phone within reach;in bed;with bed alarm set Nurse Communication: Mobility status PT Visit Diagnosis: Other abnormalities of gait and mobility (R26.89);Difficulty in walking, not elsewhere classified (R26.2);Muscle weakness (generalized) (M62.81)     Time: 1000-1040 PT Time Calculation (min) (ACUTE ONLY): 40 min  Charges:  $Gait Training: 23-37 mins $Therapeutic Activity: 8-22 mins                     Baxter Flattery, PT  Acute Rehab Dept ([place]) [phone] Pager [phone]  12/15/2020    [facility] 12/15/2020, 10:53 AM

## 2020-12-15 NOTE — Plan of Care (Signed)
°  Problem: Education: °Goal: Knowledge of the prescribed therapeutic regimen will improve °Outcome: Progressing °  °Problem: Clinical Measurements: °Goal: Postoperative complications will be avoided or minimized °Outcome: Progressing °  °Problem: Pain Management: °Goal: Pain level will decrease with appropriate interventions °Outcome: Progressing °  °

## 2020-12-15 NOTE — Plan of Care (Signed)
  Problem: Pain Management: Goal: Pain level will decrease with appropriate interventions Outcome: Progressing   Problem: Education: Goal: Knowledge of General Education information will improve Description: Including pain rating scale, medication(s)/side effects and non-pharmacologic comfort measures Outcome: Progressing   Problem: Activity: Goal: Risk for activity intolerance will decrease Outcome: Progressing

## 2020-12-15 NOTE — Progress Notes (Signed)
Subjective: 5 Days Post-Op Procedure(s) (LRB): TOTAL HIP ARTHROPLASTY ANTERIOR APPROACH (Left) Patient reports pain as 1 on 0-10 scale.   +void +flatus, +BM yesterday Tolerating PO without N/V  Objective: Vital signs in last 24 hours: Temp:  [97.6 F (36.4 C)-98.5 F (36.9 C)] 98.5 F (36.9 C) (08/06 0547) Pulse Rate:  [93-97] 97 (08/06 0547) Resp:  [17-18] 18 (08/06 0547) BP: (125-149)/(77-92) 149/87 (08/06 0547) SpO2:  [96 %-100 %] 96 % (08/06 0547)  Intake/Output from previous day: 08/05 0701 - 08/06 0700 In: 1527.3 [P.O.:960; I.V.:567.3] Out: 1200 [Urine:1200] Intake/Output this shift: No intake/output data recorded.  Recent Labs    12/13/20 0327  HGB 11.3*   Recent Labs    12/13/20 0327  WBC 10.1  RBC 4.43  HCT 36.3*  PLT 202   No results for input(s): NA, K, CL, CO2, BUN, CREATININE, GLUCOSE, CALCIUM in the last 72 hours. No results for input(s): LABPT, INR in the last 72 hours.  Neurologically intact ABD soft Neurovascular intact Sensation intact distally Intact pulses distally Dorsiflexion/Plantar flexion intact Incision: dressing C/D/I No cellulitis present Compartment soft   Assessment/Plan: 5 Days Post-Op Procedure(s) (LRB): TOTAL HIP ARTHROPLASTY ANTERIOR APPROACH (Left) Advance diet Up with therapy Discharge to SNF  DVT Prophylaxis - Aspirin and TED hose Weight-bearing as tolerated.    Donald [name] 12/15/2020, 9:34 AM

## 2020-12-15 NOTE — Progress Notes (Signed)
Nelson Chimes, PA was notified about patient's tea colored urine. Also made her aware of patient's small clots in urine from 5:30 AM.  New orders from Nelson Chimes, [place]: Urinalysis (clean catch urine).  Will collect urine via clean catch when patient urinates and continue to monitor patient. Patient has no complaints of burning with urination.

## 2020-12-16 DIAGNOSIS — Z20822 Contact with and (suspected) exposure to covid-19: Secondary | ICD-10-CM | POA: Diagnosis not present

## 2020-12-16 DIAGNOSIS — I10 Essential (primary) hypertension: Secondary | ICD-10-CM | POA: Diagnosis not present

## 2020-12-16 DIAGNOSIS — E119 Type 2 diabetes mellitus without complications: Secondary | ICD-10-CM | POA: Diagnosis not present

## 2020-12-16 DIAGNOSIS — Z7982 Long term (current) use of aspirin: Secondary | ICD-10-CM | POA: Diagnosis not present

## 2020-12-16 DIAGNOSIS — Z79899 Other long term (current) drug therapy: Secondary | ICD-10-CM | POA: Diagnosis not present

## 2020-12-16 DIAGNOSIS — M1612 Unilateral primary osteoarthritis, left hip: Secondary | ICD-10-CM | POA: Diagnosis not present

## 2020-12-16 DIAGNOSIS — Z7984 Long term (current) use of oral hypoglycemic drugs: Secondary | ICD-10-CM | POA: Diagnosis not present

## 2020-12-16 LAB — URINALYSIS, ROUTINE W REFLEX MICROSCOPIC
Bacteria, UA: NONE SEEN
Bilirubin Urine: NEGATIVE
Glucose, UA: NEGATIVE mg/dL
Ketones, ur: NEGATIVE mg/dL
Leukocytes,Ua: NEGATIVE
Nitrite: NEGATIVE
Protein, ur: NEGATIVE mg/dL
Specific Gravity, Urine: 1.014 (ref 1.005–1.030)
pH: 7 (ref 5.0–8.0)

## 2020-12-16 LAB — GLUCOSE, CAPILLARY
Glucose-Capillary: 119 mg/dL — ABNORMAL HIGH (ref 70–99)
Glucose-Capillary: 113 mg/dL — ABNORMAL HIGH (ref 70–99)
Glucose-Capillary: 130 mg/dL — ABNORMAL HIGH (ref 70–99)
Glucose-Capillary: 118 mg/dL — ABNORMAL HIGH (ref 70–99)

## 2020-12-16 NOTE — Progress Notes (Signed)
Physical Therapy Treatment Patient Details Name: Donald [name] MRN: [id number] DOB: Jan 23, 1961 Today's Date: 12/16/2020    History of Present Illness 60 yo male s/p R L DA THA PMH: DM, sleep apnea, HTn, obesity    PT Comments    Pt progressing slowly. Has not mobilized/been OOB since PT saw him yesterday. Requiring incr assist with bed mobility and transfers. Continue to recommend SNF    Follow Up Recommendations  SNF (pt lives alone and is requiring increased assist for bed mobility and transfers.)     Equipment Recommendations  Rolling walker with 5" wheels;3in1 (PT)    Recommendations for Other Services       Precautions / Restrictions Precautions Precautions: Fall Restrictions Weight Bearing Restrictions: No Other Position/Activity Restrictions: WBAT    Mobility  Bed Mobility Overal bed mobility: Needs Assistance Bed Mobility: Supine to Sit;Sit to Supine     Supine to sit: HOB elevated;Min assist;Mod assist Sit to supine: Mod assist   General bed mobility comments: partial roll to left side. pt exerting increased effort and requires extra time to complete bed mobility. educated on use of belt to assist Lt LE off EOB. pt unable to complete without assist and Min-Mod assist required due to significant posterior lean with sitting up to EOB. assist to elevate bil LEs on to bed, heavy use of rail to position self in supine    Transfers Overall transfer level: Needs assistance Equipment used: Rolling walker (2 wheeled) Transfers: Sit to/from Stand Sit to Stand: From elevated surface;Min assist;Mod assist         General transfer comment: cues for LE and UE position, for anterior -superior wt shift, to  power up with LEs. pt requiring incr assist to rise, stabilize and transistion to RW  Ambulation/Gait Ambulation/Gait assistance: Min guard Gait Distance (Feet):  (hallway amb) Assistive device: Rolling walker (2 wheeled) Gait Pattern/deviations: Step-to  pattern;Step-through pattern;Decreased stance time - left Gait velocity: decr   General Gait Details: cues for step to sequence, pt with improved step length when leading with Rt LE, beginning stp through gait. Cues throughout to improve upright posture/trunk and hip extension and L knee flexion during swing.   Stairs             Wheelchair Mobility    Modified Rankin (Stroke Patients Only)       Balance Overall balance assessment: History of Falls Sitting-balance support: Feet supported;Bilateral upper extremity supported Sitting balance-Leahy Scale: Fair Sitting balance - Comments: pt reliant on UE support to maintain seated balance at EOB. Postural control: Posterior lean   Standing balance-Leahy Scale: Poor Standing balance comment: reliant on UEs and external support, pt able to reach for toilet paper in bathroom and complete pericare in standing.                            Cognition Arousal/Alertness: Awake/alert Behavior During Therapy: WFL for tasks assessed/performed Overall Cognitive Status: Within Functional Limits for tasks assessed                                        Exercises Total Joint Exercises Ankle Circles/Pumps: AROM;Both;10 reps Heel Slides:  (pt verbalizes and reports he has been doing on his own)    General Comments        Pertinent Vitals/Pain Pain Assessment: Faces Faces Pain Scale: Hurts a little  bit Pain Location: L hip Pain Descriptors / Indicators: Discomfort;Sore Pain Intervention(s): Limited activity within patient's tolerance;Monitored during session;Premedicated before session;Repositioned    Home Living                      Prior Function            PT Goals (current goals can now be found in the care plan section) Acute Rehab PT Goals Patient Stated Goal: home soon PT Goal Formulation: With patient Time For Goal Achievement: 12/25/20 Potential to Achieve Goals: Good     Frequency    7X/week      PT Plan Current plan remains appropriate    Co-evaluation              AM-PAC PT "6 Clicks" Mobility   Outcome Measure  Help needed turning from your back to your side while in a flat bed without using bedrails?: A Lot Help needed moving from lying on your back to sitting on the side of a flat bed without using bedrails?: A Lot Help needed moving to and from a bed to a chair (including a wheelchair)?: A Little Help needed standing up from a chair using your arms (e.g., wheelchair or bedside chair)?: A Little Help needed to walk in hospital room?: A Little Help needed climbing 3-5 steps with a railing? : A Lot 6 Click Score: 15    End of Session Equipment Utilized During Treatment: Gait belt Activity Tolerance: Patient tolerated treatment well Patient left: with call bell/phone within reach;in bed;with bed alarm set Nurse Communication: Mobility status PT Visit Diagnosis: Other abnormalities of gait and mobility (R26.89);Difficulty in walking, not elsewhere classified (R26.2);Muscle weakness (generalized) (M62.81)     Time: [id number] PT Time Calculation (min) (ACUTE ONLY): 34 min  Charges:  $Gait Training: 23-37 mins                     Baxter Flattery, PT  Acute Rehab Dept ([place]) [phone] Pager [phone]  12/16/2020    [facility] 12/16/2020, 3:10 PM

## 2020-12-16 NOTE — Progress Notes (Signed)
   Subjective: 6 Days Post-Op Procedure(s) (LRB): TOTAL HIP ARTHROPLASTY ANTERIOR APPROACH (Left) Patient reports pain as mild.   Plan is to go Skilled nursing facility after hospital stay.  Objective: Vital signs in last 24 hours: Temp:  [97.9 F (36.6 C)-98.2 F (36.8 C)] 97.9 F (36.6 C) (08/07 0618) Pulse Rate:  [85-99] 85 (08/07 0618) Resp:  [16-18] 18 (08/07 0618) BP: (136-149)/(84-98) 149/98 (08/07 0618) SpO2:  [97 %-98 %] 97 % (08/07 0618)  Intake/Output from previous day:  Intake/Output Summary (Last 24 hours) at 12/16/2020 0926 Last data filed at 12/16/2020 0618 Gross per 24 hour  Intake 1860 ml  Output 1700 ml  Net 160 ml    Intake/Output this shift: No intake/output data recorded.  Labs: No results for input(s): HGB in the last 72 hours. No results for input(s): WBC, RBC, HCT, PLT in the last 72 hours. No results for input(s): NA, K, CL, CO2, BUN, CREATININE, GLUCOSE, CALCIUM in the last 72 hours. No results for input(s): LABPT, INR in the last 72 hours.  EXAM General - Patient is Alert, Appropriate, and Oriented Extremity - Neurologically intact Neurovascular intact No cellulitis present Compartment soft Dressing/Incision - clean, dry, no drainage Motor Function - intact, moving foot and toes well on exam.   Past Medical History:  Diagnosis Date   Anemia    Arthritis    DM (diabetes mellitus) ([place])    Hyperlipidemia    Hypertension    Obesity    Sleep apnea    on CPAP    Assessment/Plan: 6 Days Post-Op Procedure(s) (LRB): TOTAL HIP ARTHROPLASTY ANTERIOR APPROACH (Left) Active Problems:   Primary osteoarthritis of left hip   Up with therapy Discharge to SNF  Weight Bearing As Tolerated left Leg  Gaynelle Arabian 12/16/2020, 9:26 AM

## 2020-12-16 NOTE — Plan of Care (Signed)
  Problem: Activity: Goal: Ability to avoid complications of mobility impairment will improve Outcome: Progressing   Problem: Clinical Measurements: Goal: Postoperative complications will be avoided or minimized Outcome: Progressing   Problem: Pain Management: Goal: Pain level will decrease with appropriate interventions Outcome: Progressing   

## 2020-12-16 NOTE — Progress Notes (Signed)
Called [place], [place] to inform that patient's urine culture results are abnormal. He stated he will pass this on to the [facility] team. Also informed Fenton Foy, PA that a urinalysis was not ordered.  Received verbal order from Fenton Foy, PA to get a urinalysis on this patient. A new urinal and urine cup has been put in the patient's room. Made the patient aware to press call button when he has urinated so that a specimen can be sent down to lab.

## 2020-12-16 NOTE — Plan of Care (Signed)
  Problem: Education: Goal: Knowledge of General Education information will improve Description: Including pain rating scale, medication(s)/side effects and non-pharmacologic comfort measures Outcome: Progressing   Problem: Health Behavior/Discharge Planning: Goal: Ability to manage health-related needs will improve Outcome: Progressing   Problem: Pain Managment: Goal: General experience of comfort will improve Outcome: Progressing   

## 2020-12-17 DIAGNOSIS — Z96642 Presence of left artificial hip joint: Secondary | ICD-10-CM | POA: Diagnosis not present

## 2020-12-17 DIAGNOSIS — Z79899 Other long term (current) drug therapy: Secondary | ICD-10-CM | POA: Diagnosis not present

## 2020-12-17 DIAGNOSIS — I10 Essential (primary) hypertension: Secondary | ICD-10-CM | POA: Diagnosis not present

## 2020-12-17 DIAGNOSIS — M1380 Other specified arthritis, unspecified site: Secondary | ICD-10-CM | POA: Diagnosis not present

## 2020-12-17 DIAGNOSIS — R278 Other lack of coordination: Secondary | ICD-10-CM | POA: Diagnosis not present

## 2020-12-17 DIAGNOSIS — Z7982 Long term (current) use of aspirin: Secondary | ICD-10-CM | POA: Diagnosis not present

## 2020-12-17 DIAGNOSIS — M1612 Unilateral primary osteoarthritis, left hip: Secondary | ICD-10-CM | POA: Diagnosis not present

## 2020-12-17 DIAGNOSIS — G4733 Obstructive sleep apnea (adult) (pediatric): Secondary | ICD-10-CM | POA: Diagnosis not present

## 2020-12-17 DIAGNOSIS — E669 Obesity, unspecified: Secondary | ICD-10-CM | POA: Diagnosis not present

## 2020-12-17 DIAGNOSIS — Z20822 Contact with and (suspected) exposure to covid-19: Secondary | ICD-10-CM | POA: Diagnosis not present

## 2020-12-17 DIAGNOSIS — R5381 Other malaise: Secondary | ICD-10-CM | POA: Diagnosis not present

## 2020-12-17 DIAGNOSIS — Z4789 Encounter for other orthopedic aftercare: Secondary | ICD-10-CM | POA: Diagnosis not present

## 2020-12-17 DIAGNOSIS — E1169 Type 2 diabetes mellitus with other specified complication: Secondary | ICD-10-CM | POA: Diagnosis not present

## 2020-12-17 DIAGNOSIS — D509 Iron deficiency anemia, unspecified: Secondary | ICD-10-CM | POA: Diagnosis not present

## 2020-12-17 DIAGNOSIS — M6281 Muscle weakness (generalized): Secondary | ICD-10-CM | POA: Diagnosis not present

## 2020-12-17 DIAGNOSIS — E119 Type 2 diabetes mellitus without complications: Secondary | ICD-10-CM | POA: Diagnosis not present

## 2020-12-17 DIAGNOSIS — R269 Unspecified abnormalities of gait and mobility: Secondary | ICD-10-CM | POA: Diagnosis not present

## 2020-12-17 DIAGNOSIS — Z7984 Long term (current) use of oral hypoglycemic drugs: Secondary | ICD-10-CM | POA: Diagnosis not present

## 2020-12-17 DIAGNOSIS — E785 Hyperlipidemia, unspecified: Secondary | ICD-10-CM | POA: Diagnosis not present

## 2020-12-17 DIAGNOSIS — M25552 Pain in left hip: Secondary | ICD-10-CM | POA: Diagnosis not present

## 2020-12-17 LAB — URINE CULTURE
Culture: 20000 — AB
Special Requests: NORMAL

## 2020-12-17 LAB — RESP PANEL BY RT-PCR (FLU A&B, COVID) ARPGX2
Influenza A by PCR: NEGATIVE
Influenza B by PCR: NEGATIVE
SARS Coronavirus 2 by RT PCR: NEGATIVE

## 2020-12-17 LAB — GLUCOSE, CAPILLARY
Glucose-Capillary: 102 mg/dL — ABNORMAL HIGH (ref 70–99)
Glucose-Capillary: 118 mg/dL — ABNORMAL HIGH (ref 70–99)
Glucose-Capillary: 105 mg/dL — ABNORMAL HIGH (ref 70–99)
Glucose-Capillary: 103 mg/dL — ABNORMAL HIGH (ref 70–99)

## 2020-12-17 MED ORDER — AMOXICILLIN 500 MG PO CAPS: 500.0000 mg | ORAL_CAPSULE | Freq: Three times a day (TID) | ORAL | 0 refills | Status: DC

## 2020-12-17 MED ORDER — CIPROFLOXACIN HCL 500 MG PO TABS: 500.0000 mg | ORAL_TABLET | Freq: Two times a day (BID) | ORAL | Status: DC

## 2020-12-17 MED ORDER — AMOXICILLIN 500 MG PO CAPS
500.0000 mg | ORAL_CAPSULE | Freq: Three times a day (TID) | ORAL | Status: DC
  Administered 2020-12-17 (×2): 500 mg via ORAL
  Filled 2020-12-17 (×3): qty 1

## 2020-12-17 NOTE — Plan of Care (Signed)
Discharge instructions and report were given to facility. All questions were answered. Patient was transported to facility by [facility]. ? ?

## 2020-12-17 NOTE — Progress Notes (Signed)
Subjective: 7 Days Post-Op Procedure(s) (LRB): TOTAL HIP ARTHROPLASTY ANTERIOR APPROACH (Left) Patient reports pain as mild.    Objective: Vital signs in last 24 hours: Temp:  [98.1 F (36.7 C)-98.7 F (37.1 C)] 98.7 F (37.1 C) (08/08 0603) Pulse Rate:  [92-95] 95 (08/08 0603) Resp:  [16-18] 16 (08/08 0603) BP: (141-150)/(84-95) 148/91 (08/08 0603) SpO2:  [94 %-100 %] 94 % (08/08 0603)  Intake/Output from previous day: 08/07 0701 - 08/08 0700 In: 1560 [P.O.:1560] Out: 2775 [Urine:2775] Intake/Output this shift: Total I/O In: -  Out: 350 [Urine:350]  No results for input(s): HGB in the last 72 hours. No results for input(s): WBC, RBC, HCT, PLT in the last 72 hours. No results for input(s): NA, K, CL, CO2, BUN, CREATININE, GLUCOSE, CALCIUM in the last 72 hours. No results for input(s): LABPT, INR in the last 72 hours.  Neurologically intact No cellulitis present Compartment soft   Assessment/Plan: 7 Days Post-Op Procedure(s) (LRB): TOTAL HIP ARTHROPLASTY ANTERIOR APPROACH (Left) Discharge to SNF  Had negative UA but enterococcus (20K colonies) on a urine culture. Most likely a contaminant but with new implant will treat with 3 days Cipro in case it is real   Gaynelle Arabian 12/17/2020, 9:20 AM

## 2020-12-17 NOTE — Progress Notes (Signed)
[facility] picked him up just now. Patient is stable. [facility] said they could not take patient's bedside commode and walker. Patient said he will send someone tomorrow to pick up his walker and bedside commode. Patient's walker and bedside commode placed on nursing station with his name on it.

## 2020-12-17 NOTE — TOC Transition Note (Signed)
Transition of Care [facility]) - CM/SW Discharge Note  Patient Details  Name: Donald [name] MRN: [id number] Date of Birth: 1960/09/21  [facility] CM/SW Contact:  Sherie Don, LCSW Phone Number: 12/17/2020, 3:50 PM  Clinical Narrative: CSW notified by Tressa Busman with [facility] that insurance authorization was received. Discharge summary, discharge orders, and SNF transfer report were faxed to the facility in the hub. COVID test is negative. Patient is aware he will have to quarantine as he has not been boosted. Medical necessity form done; [facility] scheduled. Discharge packet completed. RN updated. TOC signing off.  Final next level of care: Skilled Nursing Facility Barriers to Discharge: Barriers Resolved  Patient Goals and CMS Choice Patient states their goals for this hospitalization and ongoing recovery are:: Go to rehab and then return home CMS Medicare.gov Compare Post Acute Care list provided to:: Patient Choice offered to / list presented to : Patient  Discharge Placement PASRR number recieved: 12/13/20        Patient chooses bed at: [facility] Patient to be transferred to facility by: [facility] Patient and family notified of of transfer: 12/17/20  Discharge Plan and Services In-house Referral: Clinical Social Work Post Acute Care Choice: [place]          DME Arranged: 3-N-1, Walker rolling DME Agency: [employer] Date DME Agency Contacted: 12/11/20 Time DME Agency Contacted: [phone] Representative spoke with at DME Agency: Freda Munro  Readmission Risk Interventions No flowsheet data found.

## 2020-12-19 DIAGNOSIS — M1612 Unilateral primary osteoarthritis, left hip: Secondary | ICD-10-CM | POA: Diagnosis not present

## 2020-12-19 DIAGNOSIS — Z96642 Presence of left artificial hip joint: Secondary | ICD-10-CM | POA: Diagnosis not present

## 2020-12-19 DIAGNOSIS — E669 Obesity, unspecified: Secondary | ICD-10-CM | POA: Diagnosis not present

## 2020-12-19 NOTE — Progress Notes (Signed)
Walker and Bedside commode/3in1 delivered to family member 23/10 at 53.

## 2020-12-28 DIAGNOSIS — Z96642 Presence of left artificial hip joint: Secondary | ICD-10-CM | POA: Diagnosis not present

## 2020-12-28 DIAGNOSIS — E1169 Type 2 diabetes mellitus with other specified complication: Secondary | ICD-10-CM | POA: Diagnosis not present

## 2020-12-28 DIAGNOSIS — R5381 Other malaise: Secondary | ICD-10-CM | POA: Diagnosis not present

## 2020-12-28 DIAGNOSIS — E785 Hyperlipidemia, unspecified: Secondary | ICD-10-CM | POA: Diagnosis not present

## 2021-01-11 ENCOUNTER — Ambulatory Visit (INDEPENDENT_AMBULATORY_CARE_PROVIDER_SITE_OTHER): Payer: BC Managed Care – PPO | Admitting: Family Medicine

## 2021-01-11 ENCOUNTER — Encounter: Payer: Self-pay | Admitting: Family Medicine

## 2021-01-11 ENCOUNTER — Other Ambulatory Visit: Payer: Self-pay

## 2021-01-11 VITALS — BP 137/95 | HR 96 | Ht 69.0 in | Wt 247.6 lb

## 2021-01-11 DIAGNOSIS — I10 Essential (primary) hypertension: Secondary | ICD-10-CM

## 2021-01-11 MED ORDER — LOSARTAN POTASSIUM 50 MG PO TABS: 50.0000 mg | ORAL_TABLET | Freq: Every day | ORAL | 3 refills | Status: DC

## 2021-01-11 NOTE — Patient Instructions (Addendum)
It was great seeing you today!  Your blood pressure was elevated today and it was also elevated at the previous visit.  This means you have hypertension.  I know your blood pressures were well controlled around your surgery and that is not unusual but I want to start you on a low-dose blood pressure medication.  I need to check your kidney function in 1 week so I have placed an order for this.  You do not need an appointment and can just come in.  Please come in 1 week after you start the medication for the blood work.  We will then check your blood pressure at your next diabetes check and can adjust the medications as needed there.  I want you to schedule an appointment after October 21 for your next diabetes checkup.  We may be able to decrease your metformin dose at the next visit.If you have any questions or concerns please call the clinic.  I hope you have a wonderful afternoon!

## 2021-01-11 NOTE — Progress Notes (Signed)
    SUBJECTIVE:   CHIEF COMPLAINT / HPI:   Hypertension Patient reports his blood pressures tend to get elevated when he comes to the hospital but were completely normal when he was in rehab after his procedure.  He has not been on hypertensive medications in the past.  Denies any symptoms like headache, shortness of breath, chest pain. OBJECTIVE:   BP (!) 137/95   Pulse 96   Ht '5\' 9"'$  (1.753 m)   Wt 247 lb 9.6 oz (112.3 kg)   SpO2 99%   BMI 36.56 kg/m   General: Well-appearing 60 year old male, no acute distress Cardiac: Regular rate and rhythm, no murmurs appreciated Respiratory: Normal abdomen: Soft, nontender, positive bowel sounds Abdomen: Soft, nontender, positive bowel sounds MSK: No gross abnormalities  ASSESSMENT/PLAN:   Hypertension Patient with diagnosis of hypertension.  Blood pressure 153/92.  Not on any agents.  Will initiate losartan 50 mg daily.  Patient will need to come for blood work in 1-2 weeks.  He is going to keep a blood pressure diary and we will follow-up in a month regarding his blood pressures.     Gifford Shave, MD [place]

## 2021-01-14 NOTE — Assessment & Plan Note (Signed)
Patient with diagnosis of hypertension.  Blood pressure 153/92.  Not on any agents.  Will initiate losartan 50 mg daily.  Patient will need to come for blood work in 1-2 weeks.  He is going to keep a blood pressure diary and we will follow-up in a month regarding his blood pressures.

## 2021-01-15 DIAGNOSIS — Z471 Aftercare following joint replacement surgery: Secondary | ICD-10-CM | POA: Diagnosis not present

## 2021-01-15 DIAGNOSIS — Z96642 Presence of left artificial hip joint: Secondary | ICD-10-CM | POA: Diagnosis not present

## 2021-01-21 ENCOUNTER — Other Ambulatory Visit: Payer: BC Managed Care – PPO

## 2021-01-21 ENCOUNTER — Other Ambulatory Visit: Payer: Self-pay

## 2021-01-21 DIAGNOSIS — I10 Essential (primary) hypertension: Secondary | ICD-10-CM | POA: Diagnosis not present

## 2021-01-22 LAB — BASIC METABOLIC PANEL
BUN/Creatinine Ratio: 7 — ABNORMAL LOW (ref 10–24)
BUN: 5 mg/dL — ABNORMAL LOW (ref 8–27)
CO2: 22 mmol/L (ref 20–29)
Calcium: 9.2 mg/dL (ref 8.6–10.2)
Chloride: 103 mmol/L (ref 96–106)
Creatinine, Ser: 0.73 mg/dL — ABNORMAL LOW (ref 0.76–1.27)
Glucose: 95 mg/dL (ref 65–99)
Potassium: 4.3 mmol/L (ref 3.5–5.2)
Sodium: 140 mmol/L (ref 134–144)
eGFR: 104 mL/min/{1.73_m2} (ref >59)

## 2021-10-15 ENCOUNTER — Encounter: Payer: Self-pay | Admitting: *Deleted

## 2022-01-01 IMAGING — RF DG HIP (WITH PELVIS) OPERATIVE*L*
1 series · 10 of 10 positions shown · non-contrast
Comparison: Preoperative radiograph 10/11/2018

CLINICAL DATA: Elective surgery.

EXAM:
OPERATIVE LEFT HIP (WITH PELVIS IF PERFORMED)
TECHNIQUE: Fluoroscopic spot image(s) were submitted for interpretation
post-operatively.

[Series 1: unknown protocol · 0.20mm/px · 10 of 10 slices shown]
[im 1/10]
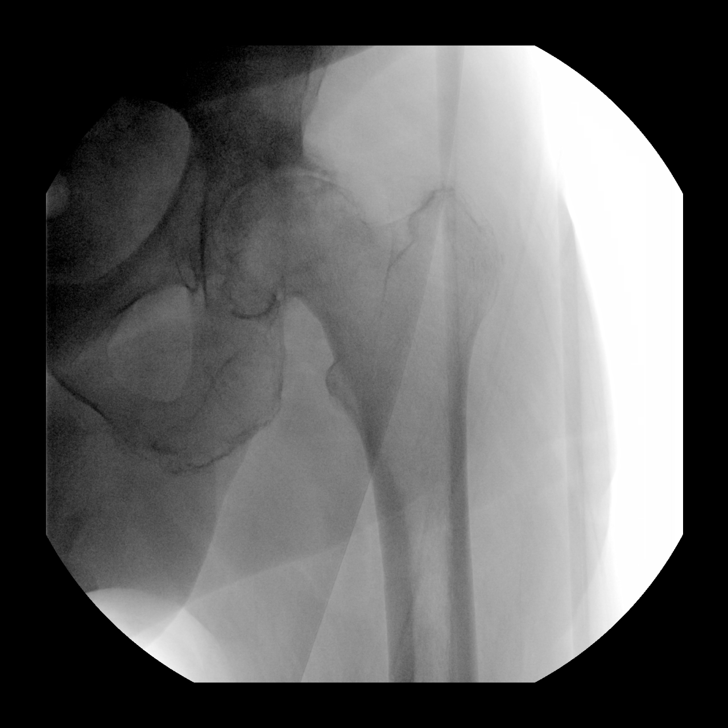
[im 2/10]
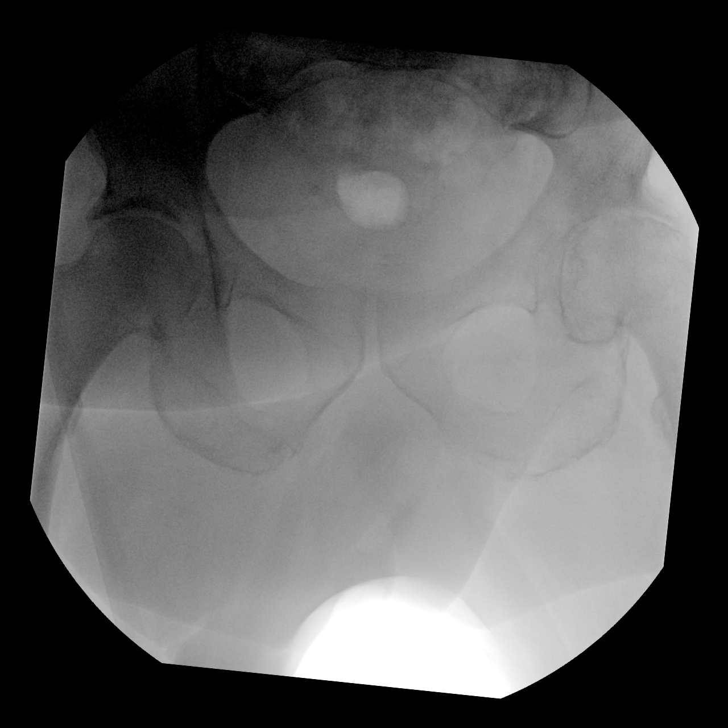
[im 3/10]
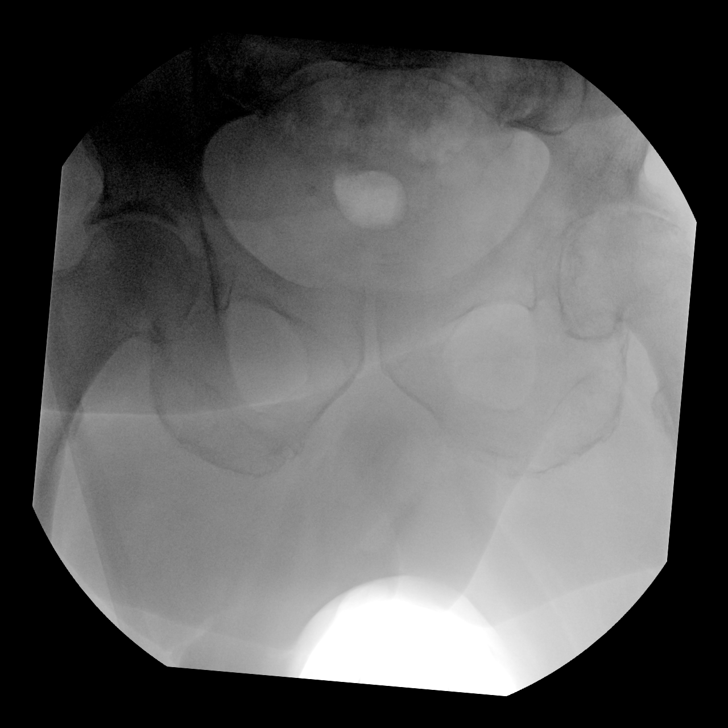
[im 4/10]
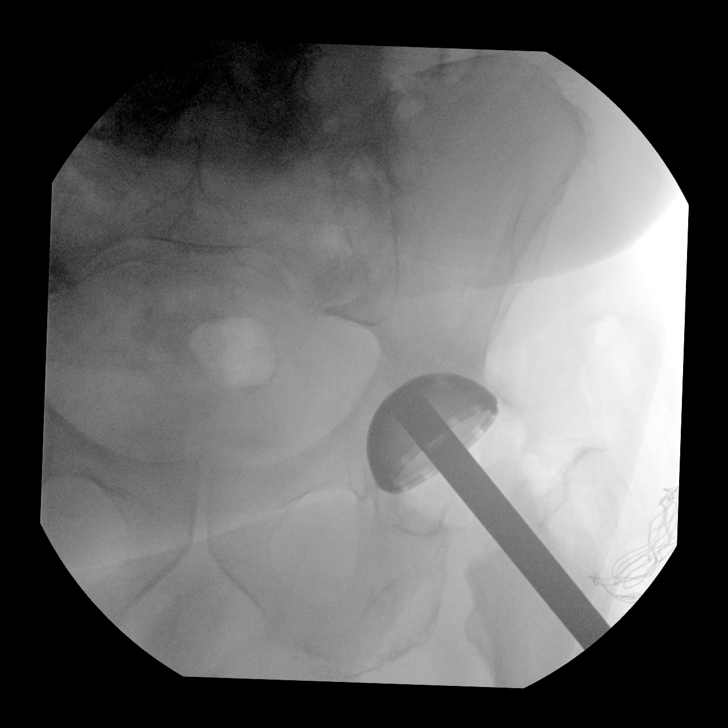
[im 5/10]
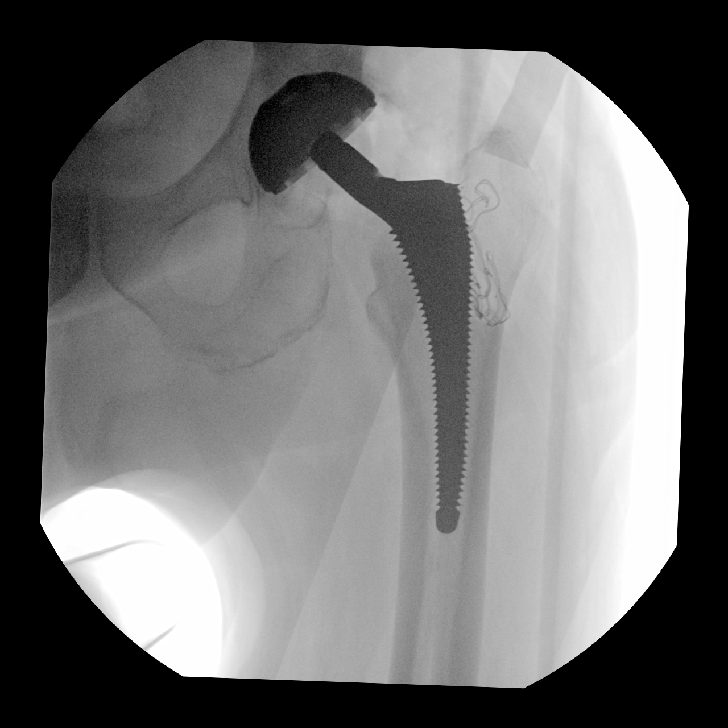
[im 6/10]
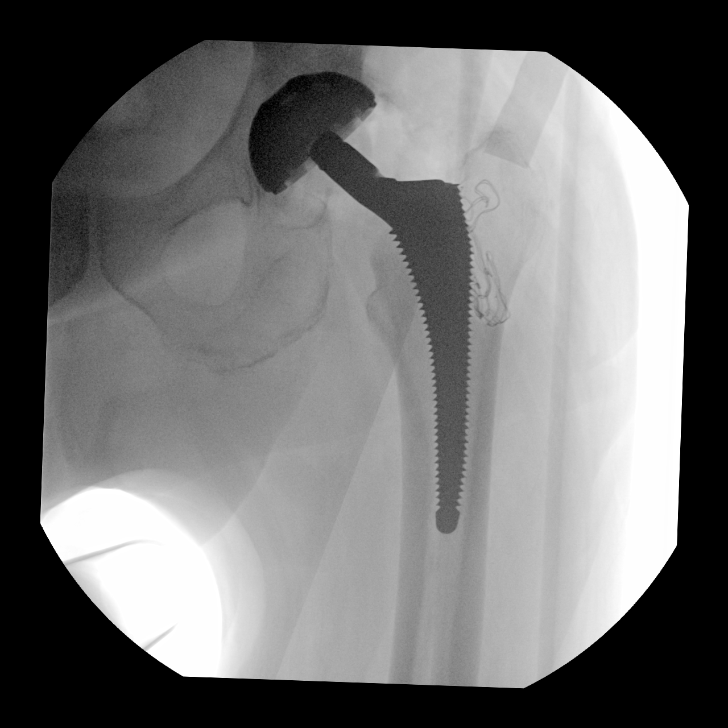
[im 7/10]
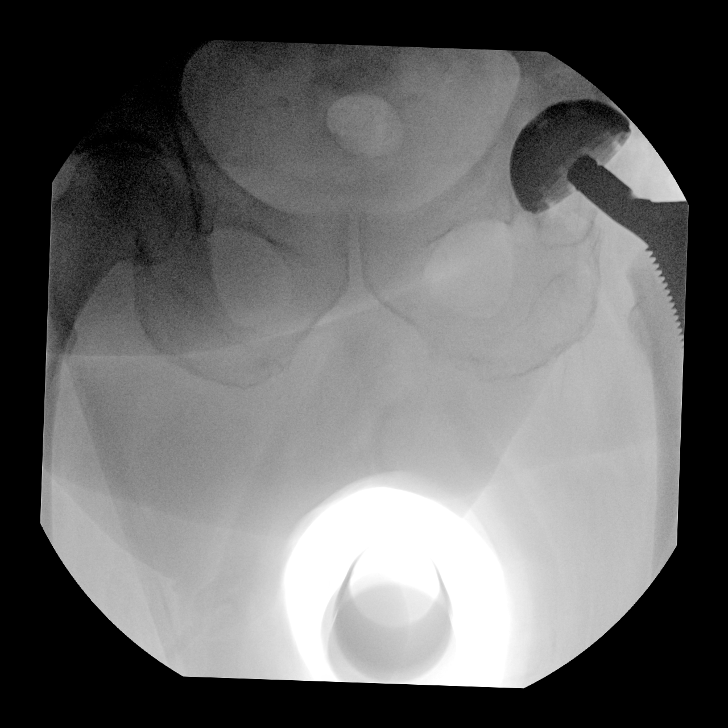
[im 8/10]
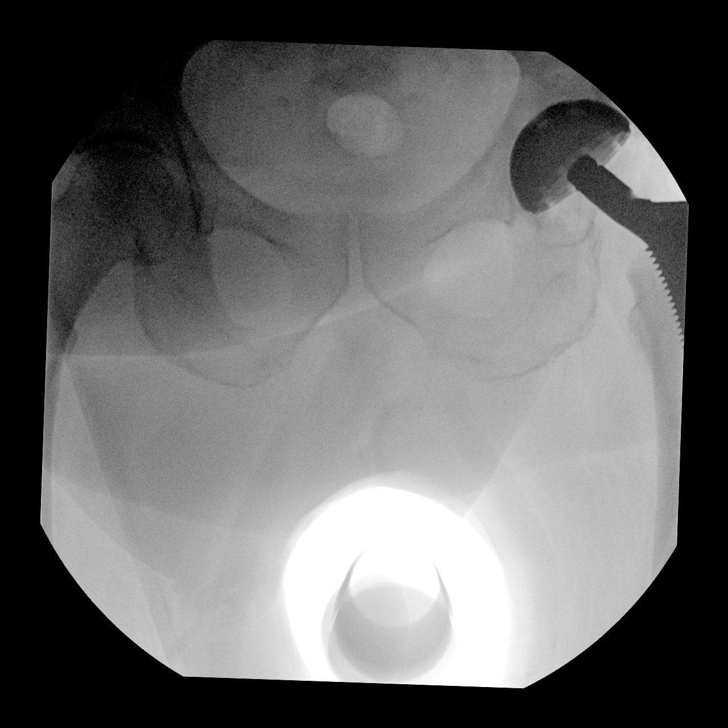
[im 9/10]
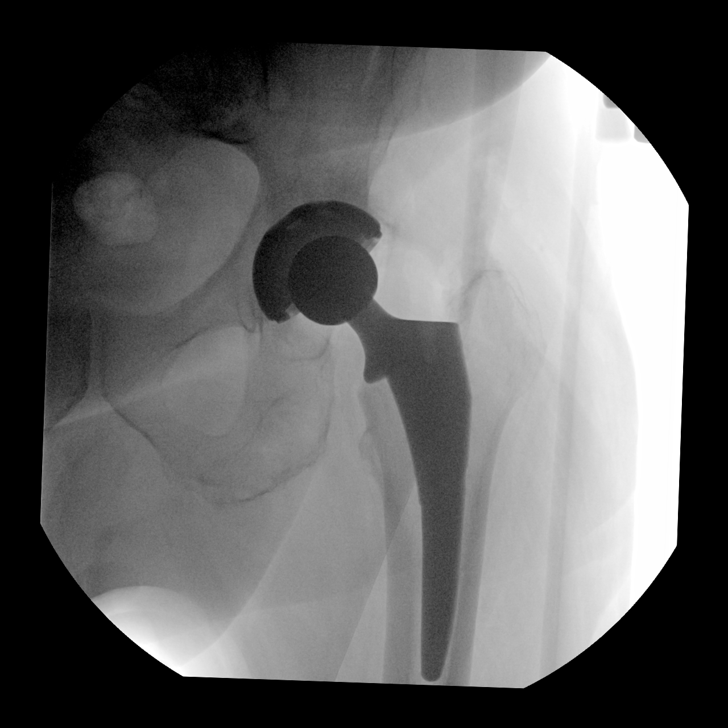
[im 10/10]
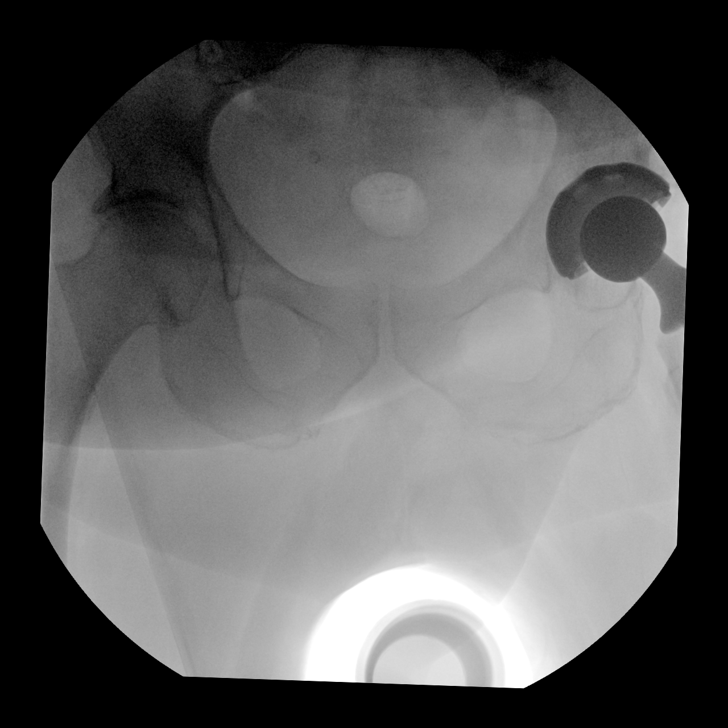

[10 of 10 positions shown; findings below may reference images not displayed]

FINDINGS: Ten fluoroscopic spot views of the pelvis and left hip obtained in
the operating room. Sequential images during left hip arthroplasty.
Fluoroscopy time 8 seconds. Dose not provided.
IMPRESSION: Procedural fluoroscopy for left hip arthroplasty.

## 2022-01-17 ENCOUNTER — Other Ambulatory Visit: Payer: Self-pay | Admitting: Family Medicine

## 2022-02-17 DIAGNOSIS — L603 Nail dystrophy: Secondary | ICD-10-CM | POA: Diagnosis not present

## 2022-02-17 DIAGNOSIS — E1151 Type 2 diabetes mellitus with diabetic peripheral angiopathy without gangrene: Secondary | ICD-10-CM | POA: Diagnosis not present

## 2022-02-17 DIAGNOSIS — I739 Peripheral vascular disease, unspecified: Secondary | ICD-10-CM | POA: Diagnosis not present

## 2022-02-17 DIAGNOSIS — L84 Corns and callosities: Secondary | ICD-10-CM | POA: Diagnosis not present

## 2022-03-23 ENCOUNTER — Other Ambulatory Visit: Payer: Self-pay | Admitting: Family Medicine

## 2022-07-08 DIAGNOSIS — E1142 Type 2 diabetes mellitus with diabetic polyneuropathy: Secondary | ICD-10-CM | POA: Diagnosis not present

## 2022-07-08 DIAGNOSIS — L603 Nail dystrophy: Secondary | ICD-10-CM | POA: Diagnosis not present

## 2022-07-08 DIAGNOSIS — L84 Corns and callosities: Secondary | ICD-10-CM | POA: Diagnosis not present

## 2022-07-08 DIAGNOSIS — I739 Peripheral vascular disease, unspecified: Secondary | ICD-10-CM | POA: Diagnosis not present

## 2022-07-08 DIAGNOSIS — E1151 Type 2 diabetes mellitus with diabetic peripheral angiopathy without gangrene: Secondary | ICD-10-CM | POA: Diagnosis not present

## 2022-10-07 DIAGNOSIS — L603 Nail dystrophy: Secondary | ICD-10-CM | POA: Diagnosis not present

## 2022-10-07 DIAGNOSIS — L84 Corns and callosities: Secondary | ICD-10-CM | POA: Diagnosis not present

## 2022-10-07 DIAGNOSIS — E1142 Type 2 diabetes mellitus with diabetic polyneuropathy: Secondary | ICD-10-CM | POA: Diagnosis not present

## 2022-10-07 DIAGNOSIS — E1151 Type 2 diabetes mellitus with diabetic peripheral angiopathy without gangrene: Secondary | ICD-10-CM | POA: Diagnosis not present

## 2022-10-07 DIAGNOSIS — I739 Peripheral vascular disease, unspecified: Secondary | ICD-10-CM | POA: Diagnosis not present

## 2023-07-08 DIAGNOSIS — E1151 Type 2 diabetes mellitus with diabetic peripheral angiopathy without gangrene: Secondary | ICD-10-CM | POA: Diagnosis not present

## 2023-07-08 DIAGNOSIS — E1142 Type 2 diabetes mellitus with diabetic polyneuropathy: Secondary | ICD-10-CM | POA: Diagnosis not present

## 2023-07-08 DIAGNOSIS — L603 Nail dystrophy: Secondary | ICD-10-CM | POA: Diagnosis not present

## 2023-07-08 DIAGNOSIS — L84 Corns and callosities: Secondary | ICD-10-CM | POA: Diagnosis not present

## 2023-07-08 DIAGNOSIS — I739 Peripheral vascular disease, unspecified: Secondary | ICD-10-CM | POA: Diagnosis not present

## 2023-10-13 ENCOUNTER — Ambulatory Visit: Admitting: Podiatry

## 2023-10-13 ENCOUNTER — Encounter: Payer: Self-pay | Admitting: Podiatry

## 2023-10-13 DIAGNOSIS — B351 Tinea unguium: Secondary | ICD-10-CM | POA: Diagnosis not present

## 2023-10-13 DIAGNOSIS — M79671 Pain in right foot: Secondary | ICD-10-CM | POA: Diagnosis not present

## 2023-10-13 DIAGNOSIS — E1151 Type 2 diabetes mellitus with diabetic peripheral angiopathy without gangrene: Secondary | ICD-10-CM

## 2023-10-13 DIAGNOSIS — M79672 Pain in left foot: Secondary | ICD-10-CM | POA: Diagnosis not present

## 2023-10-13 DIAGNOSIS — I70209 Unspecified atherosclerosis of native arteries of extremities, unspecified extremity: Secondary | ICD-10-CM

## 2023-10-13 NOTE — Progress Notes (Signed)
 Patient presents for evaluation and treatment of tenderness and some redness around nails feet.  Tenderness around toes with walking and wearing shoes.  Physical exam:  General appearance: Alert, pleasant, and in no acute distress.  Vascular: Pedal pulses: DP 2/4, PT0/4.  Moderate edema lower legs bilaterally  Neurological:  No complaint of burning or paresthesias feet bilaterally  Dermatologic:  Nails thickened, disfigured, discolored 1-5 BL with subungual debris.  Redness and hypertrophic nail folds along nail folds bilaterally but no signs of drainage or infection.  Musculoskeletal:  Hammertoes 2 through 5 bilaterally   Diagnosis: 1. Painful onychomycotic nails 1 through 5 bilaterally. 2. Pain toes 1 through 5 bilaterally.  Plan: Debrided onychomycotic nails 1 through 5 bilaterally.  Return 3 months

## 2023-11-17 ENCOUNTER — Ambulatory Visit (INDEPENDENT_AMBULATORY_CARE_PROVIDER_SITE_OTHER)

## 2023-11-17 ENCOUNTER — Ambulatory Visit: Payer: Self-pay

## 2023-11-17 VITALS — BP 132/70 | HR 78 | Ht 69.0 in | Wt 304.0 lb

## 2023-11-17 DIAGNOSIS — G4730 Sleep apnea, unspecified: Secondary | ICD-10-CM

## 2023-11-17 DIAGNOSIS — Z0000 Encounter for general adult medical examination without abnormal findings: Secondary | ICD-10-CM

## 2023-11-17 DIAGNOSIS — G609 Hereditary and idiopathic neuropathy, unspecified: Secondary | ICD-10-CM

## 2023-11-17 DIAGNOSIS — E782 Mixed hyperlipidemia: Secondary | ICD-10-CM

## 2023-11-17 DIAGNOSIS — Z23 Encounter for immunization: Secondary | ICD-10-CM | POA: Diagnosis not present

## 2023-11-17 DIAGNOSIS — R600 Localized edema: Secondary | ICD-10-CM

## 2023-11-17 DIAGNOSIS — R7303 Prediabetes: Secondary | ICD-10-CM

## 2023-11-17 LAB — POCT GLYCOSYLATED HEMOGLOBIN (HGB A1C): HbA1c, POC (controlled diabetic range): 6.4 % (ref 0.0–7.0)

## 2023-11-17 MED ORDER — METFORMIN HCL ER 500 MG PO TB24: 500.0000 mg | ORAL_TABLET | Freq: Two times a day (BID) | ORAL | 1 refills | Status: AC

## 2023-11-17 MED ORDER — ROSUVASTATIN CALCIUM 5 MG PO TABS: 5.0000 mg | ORAL_TABLET | Freq: Every day | ORAL | 3 refills | Status: AC

## 2023-11-17 NOTE — Assessment & Plan Note (Signed)
 On rosuvastatin  5 mg, history of elevated cholesterol with one abnormal triglycerides years ago.

## 2023-11-17 NOTE — Progress Notes (Signed)
    SUBJECTIVE:   CHIEF COMPLAINT / HPI:   Peripheral neuropathy: Patient has never actually been diagnosed with type 2 diabetes.  He reports the neuropathy started about 2 weeks ago and has been persistent with no change. describes pain as electrical tingling sensations of fingers and toes.  Not painful, no numbness, no change in motor function, no history of symptoms prior.  Peripheral edema: Patient reports new onset that also started about 2 weeks ago he noticed swelling of his legs.  He also noticed a rash a week ago of his right leg with an ill-defined border.  It is nonpruritic and he denies any fevers, chills, nausea, or vomiting.  He does not report chest pain, shortness of breath, dyspnea, or orthopnea.  No history of prior edema.  Sleep apnea: [place] he was diagnosed 20 years ago, CPAP is old and no longer works properly.  He reports very low energy and needs to drink a lot of caffeinated sodas to stay awake at work.  Also having trouble driving home from work, needing to pull over so he can take a nap.  Annual wellness: We reviewed needed vaccinations. He agreed that he eats too many carbohydrates with bread, pasta, and rice.  He also enjoys smoked meats that he makes himself, and has opted to make homemade salad dressings because of all the added sugars in store-bought.  He does enjoy grilled vegetables which he tries to eat equal portions to meat. Trying to eat more fish.  PERTINENT  PMH / PSH: Hip replacement.  OBJECTIVE:   BP 132/70   Pulse 78   Ht 5' 9 (1.753 m)   Wt (!) 304 lb (137.9 kg)   SpO2 98%   BMI 44.89 kg/m    Cardiac: Regular rate and rhythm. Normal S1/S2. No murmurs, rubs, or gallops appreciated. No JVD appreciated on exam. Peripheral pulses 2+ bilaterally of both upper and lower extremities. Extremities: 4+ bilateral pitting edema of legs to mid tibia, 1 pitting edema to knees. Erythema of R leg with ill-defined borders, moderately warm, non-tender, no puss or  drainage noted. Lungs: Clear bilaterally to ascultation. No wheezes or crackles Abdomen: Obese abdomen. Normoactive bowel sounds. No tenderness to light palpation mildly tender to deep palpation in all quadrants. No hepatomegaly or splenomegaly appreciated. No fluid wave noted, negative hepatojugular reflex. No rebound or guarding.    Psych: Pleasant and appropriate    ASSESSMENT/PLAN:   Assessment & Plan Routine adult health maintenance We restarted rosuvastatin  and metformin . Gave needed immunizations and lab work. Mixed hyperlipidemia On rosuvastatin  5 mg, history of elevated cholesterol with one abnormal triglycerides years ago. Encounter for immunization  Peripheral edema Worsening over the last month, notice by pt 2 weeks ago. Rash present on lateral L distal leg that is warm but non-tender. Reports minor cuts but no puss or discharge. 4+ pitting edema bilateral legs to mid-tibia. Pending bnp, cbc, and cmp. Rest of physical exam unremarkable. Sleep apnea, unspecified type Diagnosed ~20 years ago, cpap is reportedly broken, ordered new sleep study. Prediabetes A1C today was 6.4, no reported A1c >6.4 Idiopathic peripheral neuropathy Started a few weeks ago, ordered vit b12, folate, vit D, and evaluation of peripheral edema.      Fairy Amy, MD [facility]

## 2023-11-17 NOTE — Patient Instructions (Signed)
 Good to see you today - Thank you for coming in  Things we discussed today:  Peripheral edema: This is new, and pronounced all the way up to your knees.  We have ordered several labs to evaluate this, but if you notice any worsening shortness of breath or trouble breathing with exertion, or chest pain please go to the emergency room.  Tingling of extremities: We have ordered several labs to evaluate this as well.  I will follow-up with them in MyChart as they result  Hyperlipidemia: We going to restart your Crestor , because of your risk of atherosclerosis we will also continue the aspirin .  Sleep apnea: We have ordered a sleep study for you, they will contact you soon.  Obesity: Due to the more pressing nature of your other symptoms, we will address this next visit.  Please always bring your medication bottles  Come back to see me in 2 weeks.

## 2023-11-17 NOTE — Assessment & Plan Note (Signed)
 Diagnosed ~20 years ago, cpap is reportedly broken, ordered new sleep study.

## 2023-11-18 LAB — CBC WITH DIFFERENTIAL/PLATELET
Basophils Absolute: 0 x10E3/uL (ref 0.0–0.2)
Basos: 1 %
EOS (ABSOLUTE): 0.2 x10E3/uL (ref 0.0–0.4)
Eos: 3 %
Hematocrit: 44.4 % (ref 37.5–51.0)
Hemoglobin: 13.3 g/dL (ref 13.0–17.7)
Immature Grans (Abs): 0 x10E3/uL (ref 0.0–0.1)
Immature Granulocytes: 0 %
Lymphocytes Absolute: 1.8 x10E3/uL (ref 0.7–3.1)
Lymphs: 32 %
MCH: 25 pg — ABNORMAL LOW (ref 26.6–33.0)
MCHC: 30 g/dL — ABNORMAL LOW (ref 31.5–35.7)
MCV: 84 fL (ref 79–97)
Monocytes Absolute: 0.7 x10E3/uL (ref 0.1–0.9)
Monocytes: 12 %
Neutrophils Absolute: 3 x10E3/uL (ref 1.4–7.0)
Neutrophils: 52 %
Platelets: 231 x10E3/uL (ref 150–450)
RBC: 5.32 x10E6/uL (ref 4.14–5.80)
RDW: 14.4 % (ref 11.6–15.4)
WBC: 5.8 x10E3/uL (ref 3.4–10.8)

## 2023-11-18 LAB — TSH: TSH: 1.77 u[IU]/mL (ref 0.450–4.500)

## 2023-11-18 LAB — BRAIN NATRIURETIC PEPTIDE: BNP: 8.7 pg/mL (ref 0.0–100.0)

## 2023-11-18 LAB — COMPREHENSIVE METABOLIC PANEL WITH GFR
ALT: 25 IU/L (ref 0–44)
AST: 25 IU/L (ref 0–40)
Albumin: 4 g/dL (ref 3.9–4.9)
Alkaline Phosphatase: 72 IU/L (ref 44–121)
BUN/Creatinine Ratio: 16 (ref 10–24)
BUN: 10 mg/dL (ref 8–27)
Bilirubin Total: 0.9 mg/dL (ref 0.0–1.2)
CO2: 23 mmol/L (ref 20–29)
Calcium: 9.4 mg/dL (ref 8.6–10.2)
Chloride: 101 mmol/L (ref 96–106)
Creatinine, Ser: 0.64 mg/dL — ABNORMAL LOW (ref 0.76–1.27)
Globulin, Total: 4 g/dL (ref 1.5–4.5)
Glucose: 90 mg/dL (ref 70–99)
Potassium: 4.8 mmol/L (ref 3.5–5.2)
Sodium: 138 mmol/L (ref 134–144)
Total Protein: 8 g/dL (ref 6.0–8.5)
eGFR: 106 mL/min/1.73 (ref >59)

## 2023-11-18 LAB — FOLATE: Folate: 15.8 ng/mL (ref >3.0)

## 2023-11-18 LAB — VITAMIN B12: Vitamin B-12: 471 pg/mL (ref 232–1245)

## 2023-11-18 LAB — VITAMIN D 25 HYDROXY (VIT D DEFICIENCY, FRACTURES): Vit D, 25-Hydroxy: 16.8 ng/mL — ABNORMAL LOW (ref 30.0–100.0)

## 2023-12-09 ENCOUNTER — Telehealth: Payer: Self-pay | Admitting: Family Medicine

## 2023-12-09 NOTE — Telephone Encounter (Signed)
 [facility] called regarding order for this patients sleep study. His insurance will only approve an in home sleep study, not an in lab sleep study. If you would like to continue with this sleep study, the order would need to be changed to an in home sleep study. Please advise.  Routing to PCP and provider who saw him last ([place]).

## 2023-12-11 ENCOUNTER — Other Ambulatory Visit: Payer: Self-pay

## 2023-12-11 DIAGNOSIS — G4733 Obstructive sleep apnea (adult) (pediatric): Secondary | ICD-10-CM

## 2023-12-11 NOTE — Progress Notes (Signed)
 Patient called notifying that sleep study referral will only be approved with an at-home sleep study. History of OSA for years but last sleep study was done over 10 years ago and cpap no longer works. Order placed.

## 2023-12-28 ENCOUNTER — Ambulatory Visit (HOSPITAL_BASED_OUTPATIENT_CLINIC_OR_DEPARTMENT_OTHER): Attending: Family Medicine | Admitting: Internal Medicine

## 2023-12-28 VITALS — Ht 69.0 in | Wt 304.0 lb

## 2023-12-28 DIAGNOSIS — G4733 Obstructive sleep apnea (adult) (pediatric): Secondary | ICD-10-CM | POA: Insufficient documentation

## 2024-01-09 NOTE — Procedures (Signed)
 Darryle Law [facility] [address] [place], [place] [address] Tel: [phone]   Fax: [phone]  Home Sleep Test Interpretation  Patient Name: Donald [name], Donald [name] Date: 12/28/2023  Date of Birth: 03-12-1961 Study Type: HST  Age: 63 year MRN #: [id number]  Sex: Male Interpreting Physician: NEYSA RAMA, [id number]  Height: 5' 9 Referring Physician: Isaiah HERO Rumball  Weight: 304.0 lbs Recording Tech: Will Poet RRT RPSGT RST  BMI: 45.0 Scoring Tech: Will Poet RRT RPSGT RST  ESS: 24 Neck Size: 17  %%  % Indications for Polysomnography The patient is a 63 year old Male who is 5' 9 and weighs 304.0 lbs. His BMI equals 45.0.  A home sleep apnea test was performed to evaluate for -OSA.  Medication  No Data.   Polysomnogram Data A home sleep test recorded the standard physiologic parameters including EKG, nasal and oral airflow.  Respiratory parameters of chest and abdominal movements were recorded with Respiratory Inductance Plethysmography belts.  Oxygen saturation was recorded by pulse oximetry.   Study Architecture The total recording time of the polysomnogram was 262.9 minutes. The total monitoring time was 263.5 minutes.  Time spent in Supine position was - minutes.   Respiratory Events The study revealed a presence of 330 obstructive, 8 centrals, and - mixed apneas resulting in an Apnea index of 77.0 events per hour.  There were 60 hypopneas (>=3% desaturation and/or arousal) resulting in an Apnea\Hypopnea Index (AHI >=3% desaturation and/or arousal) of 90.6 events per hour.  There were 60 hypopneas (>=4% desaturation) resulting in an Apnea\Hypopnea Index (AHI >=4% desaturation) of 90.6 events per hour.  There were - Respiratory Effort Related Arousals resulting in a RERA index of - events per hour. The Respiratory Disturbance Index is 90.6 events per hour.  The snore index was - events per hour.  Mean oxygen saturation was 87.4%.  The  lowest oxygen saturation during monitoring time was 31.0%.  Time spent <=88% oxygen saturation was 85.2 minutes (49.7%).  Cardiac Summary The average pulse rate was 94.3 bpm.  The minimum pulse rate was 23.0 bpm while the maximum pulse rate was 252.0 bpm.  Cardiac rhythm was normal/abnormal.  Comments: Severe obstructive sleep apnea, AHI (4%) 90.6/hr. Snoring with oxygen desaturation to a nadir of 31%, mean 87.4%, indicating nocturnal hypoxemia.  Diagnosis: Obstructive sleep apnea  Recommendations: Suggest CPAP titration sleep study, which will provide insurance documentation if supplemental oxygen is also needed. Otherwise consider autopap or ENT surgical evaluation.   This study was personally reviewed and electronically signed by: Dr. RAMA JONETTA NEYSA Accredited Board Certified in Sleep Medicine Date/Time: 01/09/24   1:39    %%e%    Study Overview  Recording Time: 263.3 min. Monitoring Time: 263.5 min.  Analysis Start:  07:12:27 PM Supine Time: - min.  Analysis Stop:  11:35:23 PM     Study Summary   Count Index Longest Event Duration  Apneas & Hypopneas: 398 90.6  Apneas: 49.4 sec.     Hypopneas: 55.9 sec.  RERAs: - - - sec.  Desaturations: 237 54.0 149.0 sec.  Snores: - - - sec.    Minimum Oxygen Saturation: 31.0%    Respiratory Summary   Total Duration Supine Non-Supine   Count Index Average Longest Count Index Count Index  Obstructive Apnea 330 75.1 19.5 49.4 - - 330 75.1   Mixed Apnea - - - - - - - -   Central Apnea 8 1.8 24.1 26.7 - - 8  1.8   Total Apneas 338 77.0 19.6 49.4 - - 338 77.0            Hypopneas 3% 60 13.7 N.A. N.A. - - 60 13.7   Apneas & Hyp. 3% 398 90.6 N.A. N.A. - - 398 90.6            Hypopneas 4% 60 13.7 N.A. N.A. - - 60 13.7  Apneas & Hyp. 4% 398 90.6 N.A. N.A. - - 398 90.6             RERAs - - - - - - - -  RDI 400 91.1 N.A. N.A. - - 400 91.1   Oxygen Saturation Summary   Total Supine Non-Supine  Average SpO2 87.4% - 87.4%   Minimum SpO2 31.0% - 31.0%   Maximum SpO2 100.0% - 100.0%   Oxygen Saturation Distribution  Range (%) Time in range (min) Time in range (%)  90.0 - 100.0 67.7 39.5%  80.0 - 90.0 75.3 43.9%  70.0 - 80.0 23.3 13.6%  60.0 - 70.0 2.5 1.4%  50.0 - 60.0 1.7 1.0%  0.0 - 50.0 1.0 0.6%  Time Spent <=88% SpO2  Range (%) Time in range (min) Time in range (%)  0.0 - 88.0 85.2 49.7%  Cardiac Summary   Total Supine Non-Supine  Average Pulse Rate (BPM) 94.3 - 94.3  Minimum Pulse Rate (BPM) 23.0 - 23.0  Maximum Pulse Rate (BPM) 252.0 - 252.0                      Technologist Comments  -                      Reggy Salt Diplomate, Biomedical engineer of Sleep Medicine  ELECTRONICALLY SIGNED ON:  01/09/2024, 1:35 PM [facility] PH: [phone]   FX: [phone] ACCREDITED BY THE AMERICAN ACADEMY OF SLEEP MEDICINE

## 2024-01-13 ENCOUNTER — Ambulatory Visit (INDEPENDENT_AMBULATORY_CARE_PROVIDER_SITE_OTHER): Admitting: Podiatry

## 2024-01-13 ENCOUNTER — Encounter: Payer: Self-pay | Admitting: Podiatry

## 2024-01-13 DIAGNOSIS — B351 Tinea unguium: Secondary | ICD-10-CM

## 2024-01-13 DIAGNOSIS — M79672 Pain in left foot: Secondary | ICD-10-CM

## 2024-01-13 DIAGNOSIS — M79671 Pain in right foot: Secondary | ICD-10-CM

## 2024-01-13 NOTE — Progress Notes (Signed)
 Patient presents for evaluation and treatment of tenderness and some redness around nails feet.  Tenderness around toes with walking and wearing shoes.  Physical exam:  General appearance: Alert, pleasant, and in no acute distress.  Vascular: Pedal pulses: DP 2/4 B/L, PT 0/4 B/L. Moderate edema lower legs bilaterally  Neu  Dermatologic:  Nails thickened, disfigured, discolored 1-5 BL with subungual debris.  Redness and hypertrophic nail folds along nail folds bilaterally but no signs of drainage or infection.  Skin thin and atrophic with no hair growth on the lower extremity.  Areas of hyperpigmentation lower extremity bilaterally  Musculoskeletal:     Diagnosis: 1. Painful onychomycotic nails 1 through 5 bilaterally. 2. Pain toes 1 through 5 bilaterally.  Plan: -Debrided onychomycotic nails 1 through 5 bilaterally.  Sharply debrided nails with nail clipper and reduced with a power bur.  Return 3 months [facility]

## 2024-04-14 ENCOUNTER — Ambulatory Visit: Admitting: Podiatry

## 2024-04-14 ENCOUNTER — Encounter: Payer: Self-pay | Admitting: Podiatry

## 2024-04-14 DIAGNOSIS — M79671 Pain in right foot: Secondary | ICD-10-CM | POA: Diagnosis not present

## 2024-04-14 DIAGNOSIS — B351 Tinea unguium: Secondary | ICD-10-CM

## 2024-04-14 DIAGNOSIS — M79672 Pain in left foot: Secondary | ICD-10-CM | POA: Diagnosis not present

## 2024-04-14 NOTE — Progress Notes (Signed)
 Patient presents for evaluation and treatment of tenderness and some redness around nails feet.  Tenderness around toes with walking and wearing shoes.  Physical exam:  General appearance: Alert, pleasant, and in no acute distress.  Vascular: Pedal pulses: DP 2/4 B/L, PT 0/4 B/L.  Moderate POC edema lower legs bilaterally.  Capillary refill time immediate bilaterally  Neurologic:  Dermatologic:  Nails thickened, disfigured, discolored 1-5 BL with subungual debris.  Redness and hypertrophic nail folds along nail folds bilaterally but no signs of drainage or infection.  Musculoskeletal:     Diagnosis: 1. Painful onychomycotic nails 1 through 5 bilaterally. 2. Pain toes 1 through 5 bilaterally.  Plan: -Debrided onychomycotic nails 1 through 5 bilaterally.  Sharply debrided nails with nail clipper and reduced with a power bur.  Return 3 months [facility]

## 2024-07-13 ENCOUNTER — Ambulatory Visit: Admitting: Podiatry
